# Patient Record
Sex: Female | Born: 1955 | Race: White | Hispanic: No | Marital: Married | Smoking: Never smoker
Health system: Southern US, Community
[De-identification: ages and names within clinical notes are randomized; demographics above are authoritative.]

## PROBLEM LIST (undated history)

## (undated) DIAGNOSIS — Z8489 Family history of other specified conditions: Secondary | ICD-10-CM

## (undated) DIAGNOSIS — K589 Irritable bowel syndrome without diarrhea: Secondary | ICD-10-CM

## (undated) DIAGNOSIS — G8929 Other chronic pain: Secondary | ICD-10-CM

## (undated) DIAGNOSIS — Z9889 Other specified postprocedural states: Secondary | ICD-10-CM

## (undated) DIAGNOSIS — E559 Vitamin D deficiency, unspecified: Secondary | ICD-10-CM

## (undated) DIAGNOSIS — M179 Osteoarthritis of knee, unspecified: Secondary | ICD-10-CM

## (undated) DIAGNOSIS — M7632 Iliotibial band syndrome, left leg: Secondary | ICD-10-CM

## (undated) DIAGNOSIS — E785 Hyperlipidemia, unspecified: Secondary | ICD-10-CM

## (undated) DIAGNOSIS — Z79899 Other long term (current) drug therapy: Secondary | ICD-10-CM

## (undated) DIAGNOSIS — M549 Dorsalgia, unspecified: Secondary | ICD-10-CM

## (undated) DIAGNOSIS — M1710 Unilateral primary osteoarthritis, unspecified knee: Secondary | ICD-10-CM

## (undated) DIAGNOSIS — K219 Gastro-esophageal reflux disease without esophagitis: Secondary | ICD-10-CM

## (undated) DIAGNOSIS — R609 Edema, unspecified: Secondary | ICD-10-CM

## (undated) DIAGNOSIS — J189 Pneumonia, unspecified organism: Secondary | ICD-10-CM

## (undated) DIAGNOSIS — R112 Nausea with vomiting, unspecified: Secondary | ICD-10-CM

## (undated) DIAGNOSIS — I10 Essential (primary) hypertension: Secondary | ICD-10-CM

## (undated) DIAGNOSIS — I499 Cardiac arrhythmia, unspecified: Secondary | ICD-10-CM

## (undated) HISTORY — DX: Vitamin D deficiency, unspecified: E55.9

## (undated) HISTORY — DX: Iliotibial band syndrome, left leg: M76.32

## (undated) HISTORY — DX: Osteoarthritis of knee, unspecified: M17.9

## (undated) HISTORY — DX: Hyperlipidemia, unspecified: E78.5

## (undated) HISTORY — PX: COLONOSCOPY: SHX174

## (undated) HISTORY — DX: Edema, unspecified: R60.9

## (undated) HISTORY — PX: OTHER SURGICAL HISTORY: SHX169

## (undated) HISTORY — PX: UPPER GI ENDOSCOPY: SHX6162

## (undated) HISTORY — DX: Other long term (current) drug therapy: Z79.899

## (undated) HISTORY — DX: Unilateral primary osteoarthritis, unspecified knee: M17.10

---
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
## 1988-11-21 HISTORY — PX: TUBAL LIGATION: SHX77

## 1998-05-15 ENCOUNTER — Other Ambulatory Visit: Admission: RE | Admit: 1998-05-15 | Discharge: 1998-05-15 | Payer: Self-pay | Admitting: Obstetrics & Gynecology

## 1999-04-28 ENCOUNTER — Other Ambulatory Visit: Admission: RE | Admit: 1999-04-28 | Discharge: 1999-04-28 | Payer: Self-pay | Admitting: Obstetrics & Gynecology

## 2000-05-19 ENCOUNTER — Other Ambulatory Visit: Admission: RE | Admit: 2000-05-19 | Discharge: 2000-05-19 | Payer: Self-pay | Admitting: Obstetrics & Gynecology

## 2000-12-27 ENCOUNTER — Encounter (INDEPENDENT_AMBULATORY_CARE_PROVIDER_SITE_OTHER): Payer: Self-pay

## 2000-12-27 ENCOUNTER — Other Ambulatory Visit: Admission: RE | Admit: 2000-12-27 | Discharge: 2000-12-27 | Payer: Self-pay | Admitting: Obstetrics & Gynecology

## 2002-07-08 ENCOUNTER — Other Ambulatory Visit: Admission: RE | Admit: 2002-07-08 | Discharge: 2002-07-08 | Payer: Self-pay | Admitting: Obstetrics & Gynecology

## 2003-10-15 ENCOUNTER — Other Ambulatory Visit: Admission: RE | Admit: 2003-10-15 | Discharge: 2003-10-15 | Payer: Self-pay | Admitting: Obstetrics & Gynecology

## 2004-08-04 ENCOUNTER — Encounter: Admission: RE | Admit: 2004-08-04 | Discharge: 2004-08-04 | Payer: Self-pay | Admitting: Orthopedic Surgery

## 2004-11-16 ENCOUNTER — Other Ambulatory Visit: Admission: RE | Admit: 2004-11-16 | Discharge: 2004-11-16 | Payer: Self-pay | Admitting: Obstetrics & Gynecology

## 2004-11-21 HISTORY — PX: KNEE ARTHROSCOPY: SUR90

## 2006-06-28 ENCOUNTER — Ambulatory Visit (HOSPITAL_BASED_OUTPATIENT_CLINIC_OR_DEPARTMENT_OTHER): Admission: RE | Admit: 2006-06-28 | Discharge: 2006-06-28 | Payer: Self-pay | Admitting: Orthopedic Surgery

## 2008-05-26 ENCOUNTER — Inpatient Hospital Stay (HOSPITAL_COMMUNITY): Admission: RE | Admit: 2008-05-26 | Discharge: 2008-05-28 | Payer: Self-pay | Admitting: Orthopedic Surgery

## 2011-04-05 NOTE — Op Note (Signed)
Alexis Choi, Alexis Choi            ACCOUNT NO.:  [id number]   MEDICAL RECORD NO.:  [id number]          PATIENT TYPE:  INP   LOCATION:  5015                         FACILITY:  [facility]   PHYSICIAN:  Mila Homer. Sherlean Foot, M.D. DATE OF BIRTH:  03/31/1956   DATE OF PROCEDURE:  05/26/2008  DATE OF DISCHARGE:                               OPERATIVE REPORT   SURGEON:  Mila Homer. Sherlean Foot, MD   ASSISTANT:  Laural Benes. Su Hilt, [place]   ANESTHESIA:  General.   PREOPERATIVE DIAGNOSIS:  Right knee osteoarthritis.   POSTOPERATIVE DIAGNOSIS:  Right knee osteoarthritis.   PROCEDURE:  Right total knee arthroplasty.   INDICATIONS FOR PROCEDURE:  The patient is a 51-year white female with  failure of conservative measures for osteoarthritis of right knee.  Informed consent obtained.   DESCRIPTION OF PROCEDURE:  The patient was laid supine and administered  general anesthesia.  The right leg was prepped and draped in usual  sterile fashion.  The leg was exsanguinated with a Esmarch tourniquet  and inflated to 400 mmHg due to the size of the leg.  Midline incision  was made with a #10 blade and estimated this approximately 10 inches in  length due to the size of the leg.  I then used a new blade to make a  median parapatellar arthrotomy for synovectomy.  I had to use a Chandler  retractor due to the size of the leg to retract the quad tendon.  I then  elevated deep MCL off the medial crest of the tibia.  I everted the  patella and it measured 21 mm, directly reamed down 9 mm drilled through  lug holes with 32 patella trial in place recreated at 12-mm thickness.  I then went into flexion and with some difficulty I then placed the  Homan retractor and protected her patella and patellar tendon.  I made a  perpendicular cut to the anatomic axis of the tibia.  I then used  intramedullary guide system on the femur making 6 degrees valgus cut.  Sized to a size D on the femur and 2-3 degree external rotation holes  and  made the anterior, posterior, and chamfer cuts with sagittal saw.  I  then placed the lamina spreader in the knee and removed the ACL, PCL,  medial and lateral menisci, and posterior condylar osteophytes.  A 10-mm  spacer block worked well and provided excellent flexion/extension gap  balance.  I then finished the femur with a size D finishing block,  finished the tibia with size 4 tibial tray drilling kit.  Then trialed  with a 4 tibia, D femur, 10 insert, 32 patella and had good  flexion/extension gap balance with little bit of the lateral tracking  patella and I performed the lateral release.  I then copiously  irrigated.  Cemented in the components, removed all excess cement,  allowed the cement to harden in extension.  I then left a Hemovac coming  out superolaterally deep to the arthrotomy and pain catheter coming out  superomedially and superficial of the arthrotomy and obtained hemostasis  after the tourniquet was down.  I  irrigated once again and then closed  the deep soft tissues with buried 0 Vicryl, subcuticular with 3-0 Vicryl  stitch, and skin staples.  Dressed with Xeroform dressing, sponges,  sterile Webril, and two 6-inch Ace wraps since the TED  stockings would not fit due to the size of her leg.  Estimated blood  loss 300 mL.  Tourniquet time 54 minutes.  Please note, a modifier 22  was necessary for this case due to the morbid obesity and nearly every  step being more difficult.           ______________________________  Mila Homer. Sherlean Foot, M.D.     SDL/MEDQ  D:  05/26/2008  T:  05/26/2008  Job:  [id number]

## 2011-04-08 NOTE — Op Note (Signed)
NAMEJOURDAN, Alexis Choi            ACCOUNT NO.:  [id number]   MEDICAL RECORD NO.:  [id number]          PATIENT TYPE:  AMB   LOCATION:  DSC                          FACILITY:  [facility]   PHYSICIAN:  Mila Homer. Sherlean Foot, M.D. DATE OF BIRTH:  Dec 13, 1955   DATE OF PROCEDURE:  06/28/2006  DATE OF DISCHARGE:                                 OPERATIVE REPORT   SURGEON:  Mila Homer. Sherlean Foot, M.D.   ASSISTANT:  None.   PREOPERATIVE DIAGNOSES:  Right knee medial meniscal tear, lateral meniscal  tear, osteoarthritis.   POSTOPERATIVE DIAGNOSES:  Right knee medial meniscal tear, lateral meniscal  tear, osteoarthritis.   PROCEDURE:  Right knee arthroscopy with partial lateral meniscectomy,  partial medial meniscectomy, chondroplasty of all three compartments.   ANESTHESIA:  General.   INDICATIONS FOR PROCEDURE:  The patient is a 55 year old white female with  symptoms and radiograph evidence of osteoarthritis.  Informed consent was  obtained.   DESCRIPTION OF PROCEDURE:  The patient was laid supine and administered MAC  anesthesia.  The right leg was prepped and draped in the usual sterile  fashion.  Inferolateral and inferomedial portals were created with a #11  blade, blunt trocar and cannula.  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  I performed a chondroplasty of the patella and the  trochlea.  I then went back in the medial compartment and performed a  posterior horn partial medial meniscectomy with the straight basket,  upbiting and basket forceps and the [employer].  There was a very  large osteophytic sharp spur in the notch.  I did debride this with a 4.0-mm  cylindrical bur.  I then went into the figure-four position and performed a  posterior horn partial lateral meniscectomy with the straight basket forceps  and  the [employer] shaver.  I then performed a chondroplasty over much of  the weightbearing portion of the lateral femoral condyle as well.  I then  lavaged and closed with 4-0 nylon sutures, dressed with Adaptic, 4 x 4,  sterile Webril and an Ace wrap.  Complications:  None.  Drains:  None.           ______________________________  Mila Homer. Sherlean Foot, M.D.     SDL/MEDQ  D:  06/28/2006  T:  06/28/2006  Job:  [id number]

## 2011-08-18 LAB — URINALYSIS, ROUTINE W REFLEX MICROSCOPIC
Bilirubin Urine: NEGATIVE
Hgb urine dipstick: NEGATIVE
Hgb urine dipstick: NEGATIVE
Nitrite: NEGATIVE
Protein, ur: NEGATIVE
Specific Gravity, Urine: 1.021
Urobilinogen, UA: 0.2

## 2011-08-18 LAB — URINE CULTURE

## 2011-08-18 LAB — COMPREHENSIVE METABOLIC PANEL
AST: 22
Alkaline Phosphatase: 60
BUN: 13
GFR calc Af Amer: >60
Potassium: 4.4
Total Bilirubin: 0.4
Total Protein: 6.8

## 2011-08-18 LAB — DIFFERENTIAL
Basophils Absolute: 0.1
Basophils Relative: 1
Basophils Relative: 1
Eosinophils Relative: 2
Eosinophils Relative: 4
Lymphocytes Relative: 21
Lymphocytes Relative: 31
Lymphs Abs: 2.4
Lymphs Abs: 3.4
Monocytes Absolute: 0.5
Monocytes Absolute: 0.6
Monocytes Relative: 5
Monocytes Relative: 6
Neutro Abs: 7.9 — ABNORMAL HIGH
Neutrophils Relative %: 58
Neutrophils Relative %: 71

## 2011-08-18 LAB — CBC
HCT: 33.2 — ABNORMAL LOW
HCT: 39.7
Hemoglobin: 10 — ABNORMAL LOW
Hemoglobin: 13.1
Hemoglobin: 13.4
MCHC: 33.6
MCHC: 33.7
MCHC: 34.6
MCV: 86.7
MCV: 87
Platelets: 282
RBC: 3.32 — ABNORMAL LOW
RDW: 13.9
RDW: 14.1
RDW: 14.2
WBC: 10.8 — ABNORMAL HIGH

## 2011-08-18 LAB — BASIC METABOLIC PANEL
BUN: 6
Calcium: 8.6
Chloride: 100
Chloride: 102
Creatinine, Ser: 0.49
Creatinine, Ser: 0.62
GFR calc Af Amer: >60
Glucose, Bld: 149 — ABNORMAL HIGH
Glucose, Bld: 204 — ABNORMAL HIGH
Potassium: 3.8
Sodium: 135
Sodium: 136

## 2011-08-18 LAB — CROSSMATCH: Antibody Screen: NEGATIVE

## 2011-08-18 LAB — PROTIME-INR: INR: 0.9

## 2011-08-18 LAB — ABO/RH: ABO/RH(D): A POS

## 2011-08-18 LAB — HEMOGLOBIN A1C: Hgb A1c MFr Bld: 6.9 — ABNORMAL HIGH

## 2011-10-26 ENCOUNTER — Encounter: Payer: Self-pay | Admitting: Internal Medicine

## 2011-10-26 HISTORY — PX: UPPER GI ENDOSCOPY: SHX6162

## 2011-11-03 ENCOUNTER — Encounter: Payer: Self-pay | Admitting: Internal Medicine

## 2012-05-14 ENCOUNTER — Encounter: Payer: Self-pay | Admitting: Internal Medicine

## 2012-05-14 ENCOUNTER — Ambulatory Visit (INDEPENDENT_AMBULATORY_CARE_PROVIDER_SITE_OTHER): Payer: BC Managed Care – PPO | Admitting: Internal Medicine

## 2012-05-14 VITALS — BP 140/85 | HR 82 | Ht 62.0 in | Wt 272.1 lb

## 2012-05-14 DIAGNOSIS — R002 Palpitations: Secondary | ICD-10-CM

## 2012-05-14 NOTE — Patient Instructions (Signed)
Your physician recommends that you schedule a follow-up appointment in: AS NEEDED  

## 2012-05-14 NOTE — Progress Notes (Addendum)
ELECTROPHYSIOLOGY CONSULT NOTE  Patient ID: Alexis Choi MRN: [id number], DOB/AGE: [birth year]   Date of Consult: 05/14/2012  Primary Physician: Lucila Maine, MD Reason for Consultation: PSVT  History of Present Illness Alexis Choi is a pleasant 56 year old woman with DM and dyslipidemia who has been referred by her PCP for EP evaluation of palpitations and abnormal event monitor. Alexis Choi reports intermittent tachypalpitations since November 2012. Initially, these were occurring on a daily basis, sometimes 2-3 times per day. She states stress and caffeine make them worse. She states "my heart stops then starts jumping and fluttering" accompanied by SOB and dizziness/near-syncope. She denies chest pain. She denies frank syncope. She wore an event monitor which revealed a narrow complex tachycardia and she was started on Verapamil by her PCP. This has helped, along with decreasing her caffeine intake. Of note, she denies history of CAD/MI, valvular heart disease or CHF. She denies thyroid dysfunction.  Past Medical History  Diagnosis Date  . Diabetes mellitus   . Hyperlipidemia   . Unspecified vitamin D deficiency   . Osteoarthritis of knee     Past Surgical History  Procedure Date  . Right knee arthroscopy   . Knee replacment     Allergies/Intolerances Allergen Reactions  . Eggs   . Metformin     Current Home Medications Medication Sig Dispense Refill  . acetaminophen (TYLENOL ARTHRITIS PAIN) 650 MG CR tablet Take 650 mg by mouth every 8 (eight) hours as needed.      Marland Kitchen aspirin 81 MG tablet Take 81 mg by mouth daily.      . B Complex Vitamins (VITAMIN-B COMPLEX PO) Take 500 mg by mouth.      . celecoxib (CELEBREX) 200 MG capsule Take 200 mg by mouth 2 (two) times daily.      Marland Kitchen dicyclomine (BENTYL) 10 MG capsule Take 10 mg by mouth 2 (two) times daily.      Marland Kitchen glucose blood test strip 1 each by Other route as needed. Use as instructed      . halobetasol (ULTRAVATE) 0.05  % cream Apply topically 4 (four) times daily.      . insulin detemir (LEVEMIR) 100 UNIT/ML injection Inject 100 Units into the skin at bedtime.      . insulin lispro (HUMALOG) 100 UNIT/ML injection Inject 100 Units into the skin 3 (three) times daily before meals.      . Insulin Pen Needle (PEN NEEDLES 31GX5/16") 31G X 8 MM MISC by Does not apply route.      . Insulin Syringe-Needle U-100 (ACCUSURE INS SYR 1CC/31GX5/16") 31G X 5/16" 1 ML MISC by Does not apply route.      . magnesium gluconate (MAGONATE) 500 MG tablet Take 500 mg by mouth daily.      . mometasone (NASONEX) 50 MCG/ACT nasal spray Place 2 sprays into the nose daily.      . Multiple Vitamin (MULTIVITAMIN) capsule Take 1 capsule by mouth daily.      Marland Kitchen omeprazole (PRILOSEC OTC) 20 MG tablet Take 20 mg by mouth daily.      Marland Kitchen oxymetazoline (AFRIN) 0.05 % nasal spray Place 2 sprays into the nose 2 (two) times daily.      . pioglitazone (ACTOS) 45 MG tablet Take 45 mg by mouth daily.      . pramlintide (SYMLIN) 600 MCG/ML injection Inject 120 mcg into the skin 3 (three) times daily before meals.      . valsartan-hydrochlorothiazide (DIOVAN-HCT) 80-12.5 MG per  tablet Take 1 tablet by mouth daily.      . verapamil (CALAN-SR) 120 MG CR tablet Take 120 mg by mouth 2 (two) times daily.       Social History Social History  . Marital Status: Married   Social History Main Topics  . Smoking status: Never Smoker   . Smokeless tobacco: Not on file  . Alcohol Use: No  . Drug Use: No   Review of Systems General:  No chills, fever, night sweats or weight changes Cardiovascular:  No chest pain, dyspnea on exertion, edema, orthopnea, paroxysmal nocturnal dyspnea Dermatological: No rash, lesions or masses Respiratory: No cough, dyspnea Urologic: No hematuria, dysuria Abdominal:   No nausea, vomiting, diarrhea, bright red blood per rectum, melena, or hematemesis Neurologic:  No visual changes, weakness, changes in mental status All other  systems reviewed and are otherwise negative except as noted above.  Physical Exam Blood pressure 140/85, pulse 82, height 5\' 2"  (1.575 m), weight 272 lb 1.9 oz (123.433 kg).  General: Well developed, well appearing 56 year old female in no acute distress. HEENT: Normocephalic, atraumatic. EOMs intact. Sclera nonicteric. Oropharynx clear.  Neck: Supple. No JVD. Lungs:  Respirations regular and unlabored, CTA bilaterally. No wheezes, rales or rhonchi. Heart: RRR. S1, S2 present. No murmurs, rub, S3 or S4. Abdomen: Soft, non-distended.  Extremities: No clubbing, cyanosis or edema. DP/PT/Radials 2+ and equal bilaterally. Psych: Normal affect. Neuro: Alert and oriented X 3. Moves all extremities spontaneously.   Labs 04/19/2012  sodium 143, potassium 4.3, BUN 22, creatinine 0.5, AST 29, ALT 29, alk phos 72, albumin 4.2, magnesium 1.6  Event monitor strips reviewed which show occasional PACs and brief, nonsustained atrial tachycardia  Assessment and Plan 1. Paroxysmal atrial tachycardia -  Alexis Choi is symptomatic with palpitations, SOB and dizziness. Her symptoms have improved with Verapamil and decreased caffeine intake. We provided reassurance regarding the benign nature of PACs and PAT. We explained that treatment is based on symptoms and patient preference. We discussed treatment options with her today and she would like to continue medical therapy and we agree as Verapamil has helped her symptoms significantly. We discussed avoidance of triggers/stimulants, like caffeine, which she is doing. We will see her back in follow-up as needed.   Thank you for allowing [place] to participate in the care of your patient. Please call with any questions. The patient was seen, examined and plan of care formulated with Dr. Graciela Husbands. Signed, Rick Duff, PA-C 05/14/2012, 4:08 PM  The patient has brief episodes of an erratic heartbeat. Underwent recorder tracings, this is consistent and correlates with  nonsustained atrial tachycardia lasting up to 5-8 beats. These are improved on the verapamil. I have suggested alternative agents can be pursued i.e. beta blockers if her symptoms worsen. There is no particular need to augment therapy at this time.  She also has impending knee surgery. As her functional status is really quite limited I would recommend Myoview scanning prior to her proceed with that surgery. We will be glad to provide that service here or could be done [place] whichever is more convenient for the patient. Sherryl Manges, MD 05/14/2012 5:49 PM

## 2012-05-15 ENCOUNTER — Encounter: Payer: Self-pay | Admitting: Internal Medicine

## 2012-05-18 ENCOUNTER — Ambulatory Visit: Payer: Self-pay | Admitting: Internal Medicine

## 2012-06-01 ENCOUNTER — Other Ambulatory Visit: Payer: Self-pay

## 2013-04-09 ENCOUNTER — Telehealth: Payer: Self-pay | Admitting: Internal Medicine

## 2013-04-09 DIAGNOSIS — Z01818 Encounter for other preprocedural examination: Secondary | ICD-10-CM

## 2013-04-09 NOTE — Telephone Encounter (Signed)
Spoke with pt, according to dr Odessa Fleming last office note, pt would need stress testing prior to any knee surgery. lexiscan scheduled, instructions mailed to the pt home address.

## 2013-04-09 NOTE — Telephone Encounter (Signed)
New Problem:    Patient called in wanting to have a stress test scheduled to be surgically cleared for an upcoming knee replacement procedure.  Please call back.

## 2013-04-18 ENCOUNTER — Telehealth: Payer: Self-pay | Admitting: Internal Medicine

## 2013-04-18 ENCOUNTER — Ambulatory Visit (HOSPITAL_COMMUNITY): Payer: BC Managed Care – PPO | Attending: Internal Medicine | Admitting: Radiology

## 2013-04-18 VITALS — BP 136/67 | HR 81 | Ht 62.0 in | Wt 249.0 lb

## 2013-04-18 DIAGNOSIS — R000 Tachycardia, unspecified: Secondary | ICD-10-CM | POA: Insufficient documentation

## 2013-04-18 DIAGNOSIS — Z01810 Encounter for preprocedural cardiovascular examination: Secondary | ICD-10-CM | POA: Insufficient documentation

## 2013-04-18 DIAGNOSIS — R55 Syncope and collapse: Secondary | ICD-10-CM | POA: Insufficient documentation

## 2013-04-18 DIAGNOSIS — Z01818 Encounter for other preprocedural examination: Secondary | ICD-10-CM

## 2013-04-18 DIAGNOSIS — R0989 Other specified symptoms and signs involving the circulatory and respiratory systems: Secondary | ICD-10-CM | POA: Insufficient documentation

## 2013-04-18 DIAGNOSIS — E119 Type 2 diabetes mellitus without complications: Secondary | ICD-10-CM | POA: Insufficient documentation

## 2013-04-18 DIAGNOSIS — R0602 Shortness of breath: Secondary | ICD-10-CM | POA: Insufficient documentation

## 2013-04-18 DIAGNOSIS — E669 Obesity, unspecified: Secondary | ICD-10-CM | POA: Insufficient documentation

## 2013-04-18 DIAGNOSIS — E785 Hyperlipidemia, unspecified: Secondary | ICD-10-CM | POA: Insufficient documentation

## 2013-04-18 DIAGNOSIS — Z794 Long term (current) use of insulin: Secondary | ICD-10-CM | POA: Insufficient documentation

## 2013-04-18 DIAGNOSIS — Z87891 Personal history of nicotine dependence: Secondary | ICD-10-CM | POA: Insufficient documentation

## 2013-04-18 DIAGNOSIS — R42 Dizziness and giddiness: Secondary | ICD-10-CM | POA: Insufficient documentation

## 2013-04-18 DIAGNOSIS — R0609 Other forms of dyspnea: Secondary | ICD-10-CM | POA: Insufficient documentation

## 2013-04-18 DIAGNOSIS — R002 Palpitations: Secondary | ICD-10-CM | POA: Insufficient documentation

## 2013-04-18 DIAGNOSIS — I10 Essential (primary) hypertension: Secondary | ICD-10-CM | POA: Insufficient documentation

## 2013-04-18 MED ORDER — TECHNETIUM TC 99M SESTAMIBI GENERIC - CARDIOLITE
30.0000 | Freq: Once | INTRAVENOUS | Status: AC | PRN
  Administered 2013-04-18: 30 via INTRAVENOUS

## 2013-04-18 MED ORDER — TECHNETIUM TC 99M SESTAMIBI GENERIC - CARDIOLITE
10.0000 | Freq: Once | INTRAVENOUS | Status: AC | PRN
  Administered 2013-04-18: 10 via INTRAVENOUS

## 2013-04-18 MED ORDER — REGADENOSON 0.4 MG/5ML IV SOLN
0.4000 mg | Freq: Once | INTRAVENOUS | Status: AC
  Administered 2013-04-18: 0.4 mg via INTRAVENOUS

## 2013-04-18 NOTE — Progress Notes (Signed)
[facility] SITE [address] NUCLEAR MED [address] [place], [place] [address] [phone]    Cardiology Nuclear Med Study  Alexis Choi is a 57 y.o. female     MRN : [id number]     DOB: October 25, 1956  Procedure Date: 04/18/2013  Nuclear Med Background Indication for Stress Test:  Evaluation for Ischemia, Abnormal Event Monitory and Pending Surgical Clearance for Left Knee Surgery with Dr. Darcella Cheshire History:  '09 MPS:ok per patient Cardiac Risk Factors: History of Smoking, Hypertension, IDDM Type 2, Lipids and Obesity  Symptoms:  Dizziness, DOE, Near Syncope, Palpitations, Rapid HR and SOB   Nuclear Pre-Procedure Caffeine/Decaff Intake:  None NPO After: 10:00pm   Lungs:  Clear. O2 Sat: 99% on room air. IV 0.9% NS with Angio Cath:  22g  IV Site: R Hand  IV Started by:  Stanton Kidney, EMT-P  Chest Size (in):  40 Cup Size: B  Height: 5\' 2"  (1.575 m)  Weight:  249 lb (112.946 kg)  BMI:  Body mass index is 45.53 kg/(m^2). Tech Comments:  CBG= 226 @ 8am, per patient and 150 @ 10:30 per patient    Nuclear Med Study 1 or 2 day study: 1 day  Stress Test Type:  Lexiscan  Reading MD: Willa Rough, MD  Order Authorizing Provider:  Berton Mount, MD  Resting Radionuclide: Technetium 103m Sestamibi  Resting Radionuclide Dose: 11.0 mCi   Stress Radionuclide:  Technetium 9m Sestamibi  Stress Radionuclide Dose: 33.0 mCi           Stress Protocol Rest HR: 81 Stress HR: 112  Rest BP: 136/67 Stress BP: 143/67  Exercise Time (min): n/a METS: n/a   Predicted Max HR: 164 bpm % Max HR: 68.29 bpm Rate Pressure Product: 27253   Dose of Adenosine (mg):  n/a Dose of Lexiscan: 0.4 mg  Dose of Atropine (mg): n/a Dose of Dobutamine: n/a mcg/kg/min (at max HR)  Stress Test Technologist: Smiley Houseman, CMA-N  Nuclear Technologist:  Domenic Polite, CNMT     Rest Procedure:  Myocardial perfusion imaging was performed at rest 45 minutes following the intravenous administration of Technetium  64m Sestamibi.  Rest ECG: NSR - Normal EKG  Stress Procedure:  The patient received IV Lexiscan 0.4 mg over 15-seconds.  Technetium 47m Sestamibi injected at 30-seconds.  She c/o jaw pain with Lexiscan.  Quantitative spect images were obtained after a 45 minute delay.  Stress ECG: No significant change from baseline ECG  QPS Raw Data Images:  Patient motion noted; appropriate software correction applied. Stress Images:  Normal homogeneous uptake in all areas of the myocardium. Rest Images:  Normal homogeneous uptake in all areas of the myocardium. Subtraction (SDS):  No evidence of ischemia. Transient Ischemic Dilatation (Normal <1.22):  0.89 Lung/Heart Ratio (Normal <0.45):  0.28  Quantitative Gated Spect Images QGS EDV:  76 ml QGS ESV:  26 ml  Impression Exercise Capacity:  Lexiscan with no exercise. BP Response:  Normal blood pressure response. Clinical Symptoms:  teeth and jaw hurt ECG Impression:  No significant ST segment change suggestive of ischemia. Comparison with Prior Nuclear Study: No images to compare  Overall Impression:  Normal stress nuclear study. This is a low risk scan.  LV Ejection Fraction: 65%.  LV Wall Motion:  Normal Wall Motion.  Willa Rough, MD

## 2013-04-18 NOTE — Telephone Encounter (Signed)
Correction This Cardiac Clearance Was Given to Alexis Choi not [name] 04/18/13/KM

## 2013-04-18 NOTE — Telephone Encounter (Signed)
Walk in pt Form " [facility]" Cardiac Clearance  Paper dropped off gave to [facility] 04/18/13/KM

## 2013-05-01 ENCOUNTER — Telehealth: Payer: Self-pay | Admitting: Internal Medicine

## 2013-05-01 NOTE — Telephone Encounter (Signed)
New problem   Calling for stress test results

## 2013-05-01 NOTE — Telephone Encounter (Addendum)
Patient awaiting results from 5/29 stress test so that she can get surgical clearance for upcoming surgery with Dr. Sherlean Foot.  Also, patient is wanting to change off Verapamil to a different medication so that she can start taking Actos again. She states that when she takes Verapamil with Actos she has "major fluid retention and weight gain issues". Routed to MD/RN Larita Fife Via.  LM for patient that Dr. Graciela Husbands still seeing patients in clinic at this time. Her request was forwarded to her for his review.

## 2013-05-02 NOTE — Telephone Encounter (Signed)
Note sent to dr Sherlean Foot  Stress test looked great

## 2013-05-21 ENCOUNTER — Encounter (HOSPITAL_COMMUNITY): Payer: Self-pay | Admitting: Pharmacy Technician

## 2013-05-22 ENCOUNTER — Other Ambulatory Visit: Payer: Self-pay | Admitting: Orthopedic Surgery

## 2013-05-27 ENCOUNTER — Encounter (HOSPITAL_COMMUNITY): Payer: Self-pay

## 2013-05-27 ENCOUNTER — Encounter (HOSPITAL_COMMUNITY)
Admission: RE | Admit: 2013-05-27 | Discharge: 2013-05-27 | Disposition: A | Discharge status: Home / Self Care | Payer: BC Managed Care – PPO | Source: Ambulatory Visit | Attending: Orthopedic Surgery | Admitting: Orthopedic Surgery

## 2013-05-27 ENCOUNTER — Ambulatory Visit (HOSPITAL_COMMUNITY)
Admission: RE | Admit: 2013-05-27 | Discharge: 2013-05-27 | Disposition: A | Discharge status: Home / Self Care | Payer: BC Managed Care – PPO | Source: Ambulatory Visit | Attending: Orthopedic Surgery | Admitting: Orthopedic Surgery

## 2013-05-27 DIAGNOSIS — E119 Type 2 diabetes mellitus without complications: Secondary | ICD-10-CM | POA: Insufficient documentation

## 2013-05-27 DIAGNOSIS — Z0183 Encounter for blood typing: Secondary | ICD-10-CM | POA: Insufficient documentation

## 2013-05-27 DIAGNOSIS — Z01818 Encounter for other preprocedural examination: Secondary | ICD-10-CM | POA: Insufficient documentation

## 2013-05-27 DIAGNOSIS — E785 Hyperlipidemia, unspecified: Secondary | ICD-10-CM | POA: Insufficient documentation

## 2013-05-27 DIAGNOSIS — Z01812 Encounter for preprocedural laboratory examination: Secondary | ICD-10-CM | POA: Insufficient documentation

## 2013-05-27 HISTORY — DX: Cardiac arrhythmia, unspecified: I49.9

## 2013-05-27 HISTORY — DX: Nausea with vomiting, unspecified: R11.2

## 2013-05-27 HISTORY — DX: Gastro-esophageal reflux disease without esophagitis: K21.9

## 2013-05-27 HISTORY — DX: Nausea with vomiting, unspecified: Z98.890

## 2013-05-27 LAB — CBC WITH DIFFERENTIAL/PLATELET
Basophils Absolute: 0.1 10*3/uL (ref 0.0–0.1)
Basophils Relative: 0 % (ref 0–1)
Eosinophils Absolute: 0.2 10*3/uL (ref 0.0–0.7)
Eosinophils Relative: 2 % (ref 0–5)
HCT: 38.8 % (ref 36.0–46.0)
Hemoglobin: 13.1 g/dL (ref 12.0–15.0)
MCH: 28.6 pg (ref 26.0–34.0)
MCHC: 33.8 g/dL (ref 30.0–36.0)
MCV: 84.7 fL (ref 78.0–100.0)
Monocytes Absolute: 0.6 10*3/uL (ref 0.1–1.0)
Monocytes Relative: 5 % (ref 3–12)
RDW: 15.3 % (ref 11.5–15.5)

## 2013-05-27 LAB — COMPREHENSIVE METABOLIC PANEL
AST: 24 U/L (ref 0–37)
Albumin: 3.9 g/dL (ref 3.5–5.2)
BUN: 19 mg/dL (ref 6–23)
Calcium: 9.2 mg/dL (ref 8.4–10.5)
Chloride: 102 mEq/L (ref 96–112)
Creatinine, Ser: 0.59 mg/dL (ref 0.50–1.10)
Total Bilirubin: 0.3 mg/dL (ref 0.3–1.2)
Total Protein: 7.2 g/dL (ref 6.0–8.3)

## 2013-05-27 LAB — URINALYSIS, ROUTINE W REFLEX MICROSCOPIC
Bilirubin Urine: NEGATIVE
Ketones, ur: NEGATIVE mg/dL
Leukocytes, UA: NEGATIVE
Nitrite: NEGATIVE
Specific Gravity, Urine: 1.028 (ref 1.005–1.030)
Urobilinogen, UA: 0.2 mg/dL (ref 0.0–1.0)

## 2013-05-27 LAB — TYPE AND SCREEN
ABO/RH(D): A POS
Antibody Screen: NEGATIVE

## 2013-05-27 LAB — PROTIME-INR
INR: 0.98 (ref 0.00–1.49)
Prothrombin Time: 12.8 seconds (ref 11.6–15.2)

## 2013-05-27 LAB — APTT: aPTT: 29 seconds (ref 24–37)

## 2013-05-27 LAB — URINE MICROSCOPIC-ADD ON

## 2013-05-27 NOTE — Pre-Procedure Instructions (Addendum)
Jakita Dutkiewicz Mihalko  05/27/2013   Your procedure is scheduled on:  7.14.14  Report to [facility] at 700 AM.  Call this number if you have problems the morning of surgery: [phone]   Remember:   Do not eat food or drink liquids after midnight.   Take these medicines the morning of surgery with A SIP OF WATER: omeprazole   Do not wear jewelry, make-up or nail polish.  Do not wear lotions, powders, or perfumes. You may wear deodorant.  Do not shave 48 hours prior to surgery. Men may shave face and neck.  Do not bring valuables to the hospital.  [facility] is not responsible                   for any belongings or valuables.  Contacts, dentures or bridgework may not be worn into surgery.  Leave suitcase in the car. After surgery it may be brought to your room.  For patients admitted to the hospital, checkout time is 11:00 AM the day of  discharge.   Patients discharged the day of surgery will not be allowed to drive  home.  Name and phone number of your driver:   Special Instructions: Incentive Spirometry - Practice and bring it with you on the day of surgery. Shower using CHG 2 nights before surgery and the night before surgery.  If you shower the day of surgery use CHG.  Use special wash - you have one bottle of CHG for all showers.  You should use approximately 1/3 of the bottle for each shower.   Please read over the following fact sheets that you were given: Pain Booklet, Coughing and Deep Breathing, Blood Transfusion Information, Total Joint Packet and MRSA Information

## 2013-05-27 NOTE — Progress Notes (Signed)
ekg reqd from dr Graciela Husbands, clearance note in epic 6/14, stress 5/14

## 2013-05-27 NOTE — Progress Notes (Signed)
Office called .message left for terri, re: no teds large enough .calf size 19.5

## 2013-05-28 LAB — URINE CULTURE

## 2013-05-28 NOTE — Progress Notes (Signed)
Anesthesia chart review:  Patient is a 57 year old female scheduled for left TKA on 06/03/13 by Dr. Sherlean Foot.  History includes non-smoker, post-operative N/V, DM2, HLD, paroxsymal atrial tachycardia, GERD, osteoarthritis, chronic edema, tonsillectomy, right TKA '09. PCP is listed as Dr. Lucila Maine.    Dr. Lorin Picket referred patient to [facility] for evaluation of PSVT.  She was seen by Rick Duff, PA-C on 05/14/12.  Her symptoms were better controlled with verapamil and caffeine restriction.  Dr. Graciela Husbands had recommended a Myoview prior to impending knee surgery which was ultimately done on 04/18/13 and showed: Normal stress nuclear study. This is a low risk scan. LV Ejection Fraction: 65%. LV Wall Motion: Normal Wall Motion.  EKG on 04/18/13 (see Muse) showed ST @ 111 bpm, cannot rule out anterior infarct (age undetermined).  Preoperative CXR and labs noted.  She will get a fasting CBG on arrival.  If glucose is reasonable and otherwise no acute changes then I would anticipate that she could proceed as planned.  Velna Ochs [facility]/Anesthesiology Phone [phone] 05/28/2013 4:06 PM

## 2013-06-02 MED ORDER — CEFAZOLIN SODIUM-DEXTROSE 2-3 GM-% IV SOLR
2.0000 g | INTRAVENOUS | Status: AC
  Administered 2013-06-03: 2 g via INTRAVENOUS
  Filled 2013-06-02: qty 50

## 2013-06-02 MED ORDER — TRANEXAMIC ACID 100 MG/ML IV SOLN
1000.0000 mg | INTRAVENOUS | Status: AC
  Administered 2013-06-03: 1000 mg via INTRAVENOUS
  Filled 2013-06-02: qty 10

## 2013-06-03 ENCOUNTER — Inpatient Hospital Stay (HOSPITAL_COMMUNITY): Payer: BC Managed Care – PPO | Admitting: Anesthesiology

## 2013-06-03 ENCOUNTER — Encounter (HOSPITAL_COMMUNITY): Payer: Self-pay | Admitting: *Deleted

## 2013-06-03 ENCOUNTER — Encounter (HOSPITAL_COMMUNITY): Payer: Self-pay | Admitting: Vascular Surgery

## 2013-06-03 ENCOUNTER — Encounter (HOSPITAL_COMMUNITY)
Admission: RE | Disposition: A | Discharge status: Home / Self Care | Payer: Self-pay | Source: Ambulatory Visit | Attending: Orthopedic Surgery

## 2013-06-03 ENCOUNTER — Inpatient Hospital Stay (HOSPITAL_COMMUNITY)
Admission: RE | Admit: 2013-06-03 | Discharge: 2013-06-04 | DRG: 209 | Disposition: A | Discharge status: Home / Self Care | Payer: BC Managed Care – PPO | Source: Ambulatory Visit | Attending: Orthopedic Surgery | Admitting: Orthopedic Surgery

## 2013-06-03 DIAGNOSIS — Z794 Long term (current) use of insulin: Secondary | ICD-10-CM

## 2013-06-03 DIAGNOSIS — E785 Hyperlipidemia, unspecified: Secondary | ICD-10-CM | POA: Diagnosis present

## 2013-06-03 DIAGNOSIS — Z96652 Presence of left artificial knee joint: Secondary | ICD-10-CM

## 2013-06-03 DIAGNOSIS — K219 Gastro-esophageal reflux disease without esophagitis: Secondary | ICD-10-CM | POA: Diagnosis present

## 2013-06-03 DIAGNOSIS — E559 Vitamin D deficiency, unspecified: Secondary | ICD-10-CM | POA: Diagnosis present

## 2013-06-03 DIAGNOSIS — Z888 Allergy status to other drugs, medicaments and biological substances status: Secondary | ICD-10-CM

## 2013-06-03 DIAGNOSIS — E119 Type 2 diabetes mellitus without complications: Secondary | ICD-10-CM | POA: Diagnosis present

## 2013-06-03 DIAGNOSIS — M1710 Unilateral primary osteoarthritis, unspecified knee: Principal | ICD-10-CM | POA: Diagnosis present

## 2013-06-03 DIAGNOSIS — Z91040 Latex allergy status: Secondary | ICD-10-CM

## 2013-06-03 HISTORY — PX: TOTAL KNEE ARTHROPLASTY: SHX125

## 2013-06-03 LAB — GLUCOSE, CAPILLARY
Glucose-Capillary: 172 mg/dL — ABNORMAL HIGH (ref 70–99)
Glucose-Capillary: 172 mg/dL — ABNORMAL HIGH (ref 70–99)
Glucose-Capillary: 201 mg/dL — ABNORMAL HIGH (ref 70–99)
Glucose-Capillary: 259 mg/dL — ABNORMAL HIGH (ref 70–99)

## 2013-06-03 LAB — CREATININE, SERUM
GFR calc Af Amer: >90 mL/min (ref >90)
GFR calc non Af Amer: >90 mL/min (ref >90)

## 2013-06-03 LAB — CBC
HCT: 37 % (ref 36.0–46.0)
Hemoglobin: 12.3 g/dL (ref 12.0–15.0)
RBC: 4.32 MIL/uL (ref 3.87–5.11)
WBC: 20.7 10*3/uL — ABNORMAL HIGH (ref 4.0–10.5)

## 2013-06-03 SURGERY — ARTHROPLASTY, KNEE, TOTAL
Anesthesia: General | Site: Knee | Laterality: Left | Wound class: Clean

## 2013-06-03 MED ORDER — FLEET ENEMA 7-19 GM/118ML RE ENEM: 1.0000 | ENEMA | Freq: Once | RECTAL | Status: AC | PRN

## 2013-06-03 MED ORDER — MENTHOL 3 MG MT LOZG: 1.0000 | LOZENGE | OROMUCOSAL | Status: DC | PRN

## 2013-06-03 MED ORDER — ONDANSETRON HCL 4 MG/2ML IJ SOLN: 4.0000 mg | Freq: Four times a day (QID) | INTRAMUSCULAR | Status: DC | PRN

## 2013-06-03 MED ORDER — PROPOFOL 10 MG/ML IV BOLUS
INTRAVENOUS | Status: DC | PRN
  Administered 2013-06-03: 100 mg via INTRAVENOUS
  Administered 2013-06-03: 200 mg via INTRAVENOUS

## 2013-06-03 MED ORDER — ACETAMINOPHEN 325 MG PO TABS: 650.0000 mg | ORAL_TABLET | Freq: Four times a day (QID) | ORAL | Status: DC | PRN

## 2013-06-03 MED ORDER — BUPIVACAINE LIPOSOME 1.3 % IJ SUSP
20.0000 mL | Freq: Once | INTRAMUSCULAR | Status: DC
  Filled 2013-06-03: qty 20

## 2013-06-03 MED ORDER — ONDANSETRON HCL 4 MG/2ML IJ SOLN
INTRAMUSCULAR | Status: DC | PRN
  Administered 2013-06-03: 4 mg via INTRAVENOUS

## 2013-06-03 MED ORDER — IRBESARTAN 75 MG PO TABS
75.0000 mg | ORAL_TABLET | Freq: Every day | ORAL | Status: DC
  Administered 2013-06-03 – 2013-06-04 (×2): 75 mg via ORAL
  Filled 2013-06-03 (×2): qty 1

## 2013-06-03 MED ORDER — SODIUM CHLORIDE 0.9 % IR SOLN
Status: DC | PRN
  Administered 2013-06-03: 1000 mL
  Administered 2013-06-03: 3000 mL

## 2013-06-03 MED ORDER — LACTATED RINGERS IV SOLN
Freq: Once | INTRAVENOUS | Status: AC
  Administered 2013-06-03: 08:00:00 via INTRAVENOUS

## 2013-06-03 MED ORDER — INSULIN ASPART 100 UNIT/ML ~~LOC~~ SOLN
0.0000 [IU] | Freq: Three times a day (TID) | SUBCUTANEOUS | Status: DC
  Administered 2013-06-03: 7 [IU] via SUBCUTANEOUS
  Administered 2013-06-04: 4 [IU] via SUBCUTANEOUS
  Administered 2013-06-04: 15 [IU] via SUBCUTANEOUS
  Administered 2013-06-04: 4 [IU] via SUBCUTANEOUS

## 2013-06-03 MED ORDER — FENTANYL CITRATE 0.05 MG/ML IJ SOLN
INTRAMUSCULAR | Status: DC | PRN
  Administered 2013-06-03 (×2): 50 ug via INTRAVENOUS
  Administered 2013-06-03: 25 ug via INTRAVENOUS
  Administered 2013-06-03: 50 ug via INTRAVENOUS
  Administered 2013-06-03 (×2): 100 ug via INTRAVENOUS

## 2013-06-03 MED ORDER — PIOGLITAZONE HCL 45 MG PO TABS
45.0000 mg | ORAL_TABLET | ORAL | Status: DC
  Administered 2013-06-04: 45 mg via ORAL
  Filled 2013-06-03: qty 1

## 2013-06-03 MED ORDER — ALUM & MAG HYDROXIDE-SIMETH 200-200-20 MG/5ML PO SUSP: 30.0000 mL | ORAL | Status: DC | PRN

## 2013-06-03 MED ORDER — DIPHENHYDRAMINE HCL 12.5 MG/5ML PO ELIX
12.5000 mg | ORAL_SOLUTION | ORAL | Status: DC | PRN
Start: 2013-06-03 — End: 2013-06-04

## 2013-06-03 MED ORDER — INSULIN DETEMIR 100 UNIT/ML ~~LOC~~ SOLN
35.0000 [IU] | Freq: Every day | SUBCUTANEOUS | Status: DC
  Administered 2013-06-03: 35 [IU] via SUBCUTANEOUS
  Filled 2013-06-03 (×2): qty 0.35

## 2013-06-03 MED ORDER — BUPIVACAINE HCL (PF) 0.5 % IJ SOLN
INTRAMUSCULAR | Status: AC
  Filled 2013-06-03: qty 30

## 2013-06-03 MED ORDER — HYDROCODONE-ACETAMINOPHEN 7.5-325 MG/15ML PO SOLN
ORAL | Status: AC
  Filled 2013-06-03: qty 15

## 2013-06-03 MED ORDER — MIDAZOLAM HCL 2 MG/2ML IJ SOLN
INTRAMUSCULAR | Status: AC
  Administered 2013-06-03: 2 mg
  Filled 2013-06-03: qty 2

## 2013-06-03 MED ORDER — SIMVASTATIN 10 MG PO TABS
10.0000 mg | ORAL_TABLET | Freq: Every day | ORAL | Status: DC
  Administered 2013-06-03: 10 mg via ORAL
  Filled 2013-06-03 (×2): qty 1

## 2013-06-03 MED ORDER — INSULIN ASPART 100 UNIT/ML ~~LOC~~ SOLN: 10.0000 [IU] | Freq: Three times a day (TID) | SUBCUTANEOUS | Status: DC

## 2013-06-03 MED ORDER — INSULIN DETEMIR 100 UNIT/ML ~~LOC~~ SOLN: 70.0000 [IU] | Freq: Every day | SUBCUTANEOUS | Status: DC

## 2013-06-03 MED ORDER — SUCCINYLCHOLINE CHLORIDE 20 MG/ML IJ SOLN
INTRAMUSCULAR | Status: DC | PRN
  Administered 2013-06-03: 100 mg via INTRAVENOUS

## 2013-06-03 MED ORDER — METOCLOPRAMIDE HCL 10 MG PO TABS
5.0000 mg | ORAL_TABLET | Freq: Three times a day (TID) | ORAL | Status: DC | PRN
Start: 2013-06-03 — End: 2013-06-04

## 2013-06-03 MED ORDER — PROMETHAZINE HCL 25 MG/ML IJ SOLN
INTRAMUSCULAR | Status: AC
  Administered 2013-06-03: 6.25 mg via INTRAVENOUS
  Filled 2013-06-03: qty 1

## 2013-06-03 MED ORDER — FENTANYL CITRATE 0.05 MG/ML IJ SOLN: 100.0000 ug | Freq: Once | INTRAMUSCULAR | Status: AC

## 2013-06-03 MED ORDER — INSULIN DETEMIR 100 UNIT/ML ~~LOC~~ SOLN
35.0000 [IU] | Freq: Every day | SUBCUTANEOUS | Status: DC
  Administered 2013-06-03 – 2013-06-04 (×2): 35 [IU] via SUBCUTANEOUS
  Filled 2013-06-03 (×2): qty 0.35

## 2013-06-03 MED ORDER — LACTATED RINGERS IV SOLN
INTRAVENOUS | Status: DC | PRN
  Administered 2013-06-03 (×3): via INTRAVENOUS

## 2013-06-03 MED ORDER — ONDANSETRON HCL 4 MG PO TABS: 4.0000 mg | ORAL_TABLET | Freq: Four times a day (QID) | ORAL | Status: DC | PRN

## 2013-06-03 MED ORDER — SODIUM CHLORIDE 0.9 % IJ SOLN
INTRAMUSCULAR | Status: DC | PRN
  Administered 2013-06-03: 11:00:00

## 2013-06-03 MED ORDER — METHOCARBAMOL 500 MG PO TABS
500.0000 mg | ORAL_TABLET | Freq: Four times a day (QID) | ORAL | Status: DC | PRN
  Administered 2013-06-04: 500 mg via ORAL
  Filled 2013-06-03: qty 1

## 2013-06-03 MED ORDER — HYDROMORPHONE HCL PF 1 MG/ML IJ SOLN
1.0000 mg | INTRAMUSCULAR | Status: DC | PRN
  Administered 2013-06-03: 1 mg via INTRAVENOUS
  Filled 2013-06-03: qty 1

## 2013-06-03 MED ORDER — LIRAGLUTIDE 18 MG/3ML ~~LOC~~ SOPN: 1.8000 mL | PEN_INJECTOR | Freq: Every day | SUBCUTANEOUS | Status: DC

## 2013-06-03 MED ORDER — OMEPRAZOLE MAGNESIUM 20 MG PO TBEC: 20.0000 mg | DELAYED_RELEASE_TABLET | Freq: Every day | ORAL | Status: DC

## 2013-06-03 MED ORDER — OXYCODONE HCL 5 MG PO TABS
5.0000 mg | ORAL_TABLET | ORAL | Status: DC | PRN
  Administered 2013-06-03 – 2013-06-04 (×5): 10 mg via ORAL
  Filled 2013-06-03 (×5): qty 2

## 2013-06-03 MED ORDER — BUPIVACAINE HCL 0.5 % IJ SOLN
INTRAMUSCULAR | Status: DC | PRN
  Administered 2013-06-03: 30 mL

## 2013-06-03 MED ORDER — ZOLPIDEM TARTRATE 5 MG PO TABS: 5.0000 mg | ORAL_TABLET | Freq: Every evening | ORAL | Status: DC | PRN

## 2013-06-03 MED ORDER — VERAPAMIL HCL ER 120 MG PO TBCR
120.0000 mg | EXTENDED_RELEASE_TABLET | Freq: Every day | ORAL | Status: DC
  Administered 2013-06-03 – 2013-06-04 (×2): 120 mg via ORAL
  Filled 2013-06-03 (×2): qty 1

## 2013-06-03 MED ORDER — HYDROCHLOROTHIAZIDE 12.5 MG PO CAPS
12.5000 mg | ORAL_CAPSULE | Freq: Every day | ORAL | Status: DC
  Administered 2013-06-03 – 2013-06-04 (×2): 12.5 mg via ORAL
  Filled 2013-06-03 (×2): qty 1

## 2013-06-03 MED ORDER — CEFAZOLIN SODIUM-DEXTROSE 2-3 GM-% IV SOLR
2.0000 g | Freq: Four times a day (QID) | INTRAVENOUS | Status: AC
  Administered 2013-06-03 (×2): 2 g via INTRAVENOUS
  Filled 2013-06-03 (×3): qty 50

## 2013-06-03 MED ORDER — INSULIN ASPART 100 UNIT/ML ~~LOC~~ SOLN
11.0000 [IU] | Freq: Once | SUBCUTANEOUS | Status: AC
  Administered 2013-06-03: 11 [IU] via SUBCUTANEOUS

## 2013-06-03 MED ORDER — METOCLOPRAMIDE HCL 5 MG/ML IJ SOLN: 5.0000 mg | Freq: Three times a day (TID) | INTRAMUSCULAR | Status: DC | PRN

## 2013-06-03 MED ORDER — HYDROMORPHONE HCL PF 1 MG/ML IJ SOLN
INTRAMUSCULAR | Status: AC
  Administered 2013-06-03: 0.5 mg via INTRAVENOUS
  Filled 2013-06-03: qty 1

## 2013-06-03 MED ORDER — PHENOL 1.4 % MT LIQD: 1.0000 | OROMUCOSAL | Status: DC | PRN

## 2013-06-03 MED ORDER — DICYCLOMINE HCL 10 MG PO CAPS
10.0000 mg | ORAL_CAPSULE | Freq: Two times a day (BID) | ORAL | Status: DC
  Administered 2013-06-03 – 2013-06-04 (×2): 10 mg via ORAL
  Filled 2013-06-03 (×3): qty 1

## 2013-06-03 MED ORDER — DOCUSATE SODIUM 100 MG PO CAPS
100.0000 mg | ORAL_CAPSULE | Freq: Two times a day (BID) | ORAL | Status: DC
  Administered 2013-06-03 – 2013-06-04 (×2): 100 mg via ORAL
  Filled 2013-06-03 (×2): qty 1

## 2013-06-03 MED ORDER — METHOCARBAMOL 100 MG/ML IJ SOLN: 500.0000 mg | Freq: Four times a day (QID) | INTRAMUSCULAR | Status: DC | PRN

## 2013-06-03 MED ORDER — FENTANYL CITRATE 0.05 MG/ML IJ SOLN
INTRAMUSCULAR | Status: AC
  Administered 2013-06-03: 100 ug via INTRAVENOUS
  Filled 2013-06-03: qty 2

## 2013-06-03 MED ORDER — ACETAMINOPHEN 650 MG RE SUPP: 650.0000 mg | Freq: Four times a day (QID) | RECTAL | Status: DC | PRN

## 2013-06-03 MED ORDER — INSULIN DETEMIR 100 UNIT/ML ~~LOC~~ SOLN
35.0000 [IU] | Freq: Every day | SUBCUTANEOUS | Status: DC
  Administered 2013-06-04: 35 [IU] via SUBCUTANEOUS
  Filled 2013-06-03: qty 0.35

## 2013-06-03 MED ORDER — CELECOXIB 200 MG PO CAPS
200.0000 mg | ORAL_CAPSULE | Freq: Two times a day (BID) | ORAL | Status: DC
  Administered 2013-06-03 – 2013-06-04 (×2): 200 mg via ORAL
  Filled 2013-06-03 (×3): qty 1

## 2013-06-03 MED ORDER — SENNOSIDES-DOCUSATE SODIUM 8.6-50 MG PO TABS: 1.0000 | ORAL_TABLET | Freq: Every evening | ORAL | Status: DC | PRN

## 2013-06-03 MED ORDER — HYDROMORPHONE HCL PF 1 MG/ML IJ SOLN
0.2500 mg | INTRAMUSCULAR | Status: DC | PRN
  Administered 2013-06-03: 0.5 mg via INTRAVENOUS

## 2013-06-03 MED ORDER — BISACODYL 5 MG PO TBEC: 5.0000 mg | DELAYED_RELEASE_TABLET | Freq: Every day | ORAL | Status: DC | PRN

## 2013-06-03 MED ORDER — ENOXAPARIN SODIUM 30 MG/0.3ML ~~LOC~~ SOLN
30.0000 mg | Freq: Two times a day (BID) | SUBCUTANEOUS | Status: DC
  Administered 2013-06-04: 30 mg via SUBCUTANEOUS
  Filled 2013-06-03 (×3): qty 0.3

## 2013-06-03 MED ORDER — BUPIVACAINE-EPINEPHRINE PF 0.5-1:200000 % IJ SOLN
INTRAMUSCULAR | Status: DC | PRN
  Administered 2013-06-03: 150 mg

## 2013-06-03 MED ORDER — MIDAZOLAM HCL 5 MG/ML IJ SOLN: 2.0000 mg | Freq: Once | INTRAMUSCULAR | Status: AC

## 2013-06-03 MED ORDER — OXYCODONE HCL ER 10 MG PO T12A
10.0000 mg | EXTENDED_RELEASE_TABLET | Freq: Two times a day (BID) | ORAL | Status: DC
  Administered 2013-06-03 – 2013-06-04 (×2): 10 mg via ORAL
  Filled 2013-06-03 (×2): qty 1

## 2013-06-03 MED ORDER — PROMETHAZINE HCL 25 MG/ML IJ SOLN: 6.2500 mg | INTRAMUSCULAR | Status: DC | PRN

## 2013-06-03 MED ORDER — PANTOPRAZOLE SODIUM 40 MG PO TBEC
40.0000 mg | DELAYED_RELEASE_TABLET | Freq: Every day | ORAL | Status: DC
  Administered 2013-06-03 – 2013-06-04 (×2): 40 mg via ORAL
  Filled 2013-06-03 (×2): qty 1

## 2013-06-03 MED ORDER — VALSARTAN-HYDROCHLOROTHIAZIDE 80-12.5 MG PO TABS: 1.0000 | ORAL_TABLET | Freq: Every day | ORAL | Status: DC

## 2013-06-03 SURGICAL SUPPLY — 60 items
BANDAGE ELASTIC 6 VELCRO ST LF (GAUZE/BANDAGES/DRESSINGS) ×1 IMPLANT
BANDAGE ESMARK 6X9 LF (GAUZE/BANDAGES/DRESSINGS) ×1 IMPLANT
BLADE SAGITTAL 13X1.27X60 (BLADE) ×2 IMPLANT
BLADE SAW SGTL 83.5X18.5 (BLADE) ×2 IMPLANT
BNDG CMPR 9X6 STRL LF SNTH (GAUZE/BANDAGES/DRESSINGS) ×1
BNDG ESMARK 6X9 LF (GAUZE/BANDAGES/DRESSINGS) ×2
BOWL SMART MIX CTS (DISPOSABLE) ×2 IMPLANT
CAP CAP FEM/CEM TIBIA/STD ×2 IMPLANT
CEMENT BONE SIMPLEX SPEEDSET (Cement) ×4 IMPLANT
CLOTH BEACON ORANGE TIMEOUT ST (SAFETY) ×2 IMPLANT
COVER SURGICAL LIGHT HANDLE (MISCELLANEOUS) ×2 IMPLANT
CUFF TOURNIQUET SINGLE 34IN LL (TOURNIQUET CUFF) ×2 IMPLANT
DRAPE EXTREMITY T 121X128X90 (DRAPE) ×2 IMPLANT
DRAPE INCISE IOBAN 66X45 STRL (DRAPES) ×4 IMPLANT
DRAPE PROXIMA HALF (DRAPES) ×2 IMPLANT
DRAPE U-SHAPE 47X51 STRL (DRAPES) ×2 IMPLANT
DRSG ADAPTIC 3X8 NADH LF (GAUZE/BANDAGES/DRESSINGS) ×2 IMPLANT
DRSG PAD ABDOMINAL 8X10 ST (GAUZE/BANDAGES/DRESSINGS) ×2 IMPLANT
DURAPREP 26ML APPLICATOR (WOUND CARE) ×4 IMPLANT
ELECT REM PT RETURN 9FT ADLT (ELECTROSURGICAL) ×2
ELECTRODE REM PT RTRN 9FT ADLT (ELECTROSURGICAL) ×1 IMPLANT
EVACUATOR 1/8 PVC DRAIN (DRAIN) ×2 IMPLANT
GLOVE BIOGEL M 7.0 STRL (GLOVE) IMPLANT
GLOVE BIOGEL PI IND STRL 7.5 (GLOVE) IMPLANT
GLOVE BIOGEL PI IND STRL 8.5 (GLOVE) ×1 IMPLANT
GLOVE BIOGEL PI INDICATOR 7.5 (GLOVE)
GLOVE BIOGEL PI INDICATOR 8.5 (GLOVE) ×1
GLOVE BIOGEL PI ORTHO PRO SZ8 (GLOVE)
GLOVE PI ORTHO PRO STRL SZ8 (GLOVE) ×1 IMPLANT
GLOVE SURG ORTHO 8.0 STRL STRW (GLOVE) ×2 IMPLANT
GLOVE SURG SS PI 8.0 STRL IVOR (GLOVE) ×4 IMPLANT
GOWN PREVENTION PLUS XLARGE (GOWN DISPOSABLE) ×6 IMPLANT
GOWN STRL NON-REIN LRG LVL3 (GOWN DISPOSABLE) ×2 IMPLANT
HANDPIECE INTERPULSE COAX TIP (DISPOSABLE) ×2
HOOD PEEL AWAY FACE SHEILD DIS (HOOD) ×7 IMPLANT
KIT BASIN OR (CUSTOM PROCEDURE TRAY) ×2 IMPLANT
KIT ROOM TURNOVER OR (KITS) ×2 IMPLANT
MANIFOLD NEPTUNE II (INSTRUMENTS) ×2 IMPLANT
NDL HYPO 21X1.5 SAFETY (NEEDLE) ×1 IMPLANT
NEEDLE 22X1 1/2 (OR ONLY) (NEEDLE) ×1 IMPLANT
NEEDLE HYPO 21X1.5 SAFETY (NEEDLE) ×2 IMPLANT
NS IRRIG 1000ML POUR BTL (IV SOLUTION) ×2 IMPLANT
PACK TOTAL JOINT (CUSTOM PROCEDURE TRAY) ×2 IMPLANT
PAD ARMBOARD 7.5X6 YLW CONV (MISCELLANEOUS) ×4 IMPLANT
PADDING CAST COTTON 6X4 STRL (CAST SUPPLIES) ×2 IMPLANT
SET HNDPC FAN SPRY TIP SCT (DISPOSABLE) ×1 IMPLANT
SPONGE GAUZE 4X4 12PLY (GAUZE/BANDAGES/DRESSINGS) ×2 IMPLANT
STAPLER VISISTAT 35W (STAPLE) ×2 IMPLANT
SUCTION FRAZIER TIP 10 FR DISP (SUCTIONS) ×2 IMPLANT
SUT BONE WAX W31G (SUTURE) ×2 IMPLANT
SUT VIC AB 0 CTB1 27 (SUTURE) ×4 IMPLANT
SUT VIC AB 1 CT1 27 (SUTURE) ×4
SUT VIC AB 1 CT1 27XBRD ANBCTR (SUTURE) ×2 IMPLANT
SUT VIC AB 2-0 CT1 27 (SUTURE) ×4
SUT VIC AB 2-0 CT1 TAPERPNT 27 (SUTURE) ×2 IMPLANT
SYR CONTROL 10ML LL (SYRINGE) ×1 IMPLANT
TOWEL OR 17X24 6PK STRL BLUE (TOWEL DISPOSABLE) ×2 IMPLANT
TOWEL OR 17X26 10 PK STRL BLUE (TOWEL DISPOSABLE) ×2 IMPLANT
TRAY FOLEY CATH 16FRSI W/METER (SET/KITS/TRAYS/PACK) ×2 IMPLANT
WATER STERILE IRR 1000ML POUR (IV SOLUTION) ×3 IMPLANT

## 2013-06-03 NOTE — Anesthesia Preprocedure Evaluation (Addendum)
Anesthesia Evaluation    Reviewed: Allergy & Precautions, H&P , NPO status , Patient's Chart, lab work & pertinent test results, reviewed documented beta blocker date and time   History of Anesthesia Complications (+) PONV  Airway Mallampati: II      Dental  (+) Teeth Intact and Dental Advisory Given   Pulmonary neg pulmonary ROS,          Cardiovascular hypertension, Pt. on medications negative cardio ROS      Neuro/Psych negative neurological ROS  negative psych ROS   GI/Hepatic Neg liver ROS, GERD-  Medicated,  Endo/Other  diabetes, Insulin DependentMorbid obesity  Renal/GU negative Renal ROS     Musculoskeletal   Abdominal (+) + obese,   Peds  Hematology   Anesthesia Other Findings   Reproductive/Obstetrics                          Anesthesia Physical Anesthesia Plan  ASA: II  Anesthesia Plan: General   Post-op Pain Management:    Induction: Intravenous  Airway Management Planned: LMA  Additional Equipment:   Intra-op Plan:   Post-operative Plan: Extubation in OR  Informed Consent: I have reviewed the patients History and Physical, chart, labs and discussed the procedure including the risks, benefits and alternatives for the proposed anesthesia with the patient or authorized representative who has indicated his/her understanding and acceptance.   Dental advisory given  Plan Discussed with: CRNA, Anesthesiologist and Surgeon  Anesthesia Plan Comments:        Anesthesia Quick Evaluation

## 2013-06-03 NOTE — Progress Notes (Signed)
UR COMPLETED  

## 2013-06-03 NOTE — Transfer of Care (Signed)
Immediate Anesthesia Transfer of Care Note  Patient: Alexis Choi  Procedure(s) Performed: Procedure(s): LEFT TOTAL KNEE ARTHROPLASTY (Left)  Patient Location: PACU  Anesthesia Type:GA combined with regional for post-op pain  Level of Consciousness: awake and alert   Airway & Oxygen Therapy: Patient Spontanous Breathing and Patient connected to nasal cannula oxygen  Post-op Assessment: Report given to PACU RN and Post -op Vital signs reviewed and stable  Post vital signs: Reviewed and stable  Complications: No apparent anesthesia complications

## 2013-06-03 NOTE — Progress Notes (Signed)
Orthopedic Tech Progress Note Patient Details:  Alexis Choi [birth year] [id number] Applied CPM to LLE.  Applied OHF with trapeze to bed. CPM Left Knee CPM Left Knee: On Left Knee Flexion (Degrees): 90 Left Knee Extension (Degrees): 0   [name] 06/03/2013, 1:35 PM

## 2013-06-03 NOTE — Anesthesia Postprocedure Evaluation (Signed)
Anesthesia Post Note  Patient: Alexis Choi  Procedure(s) Performed: Procedure(s) (LRB): LEFT TOTAL KNEE ARTHROPLASTY (Left)  Anesthesia type: general  Patient location: PACU  Post pain: Pain level controlled  Post assessment: Patient's Cardiovascular Status Stable  Last Vitals:  Filed Vitals:   06/03/13 1345  BP: 175/73  Pulse: 91  Temp:   Resp: 16    Post vital signs: Reviewed and stable  Level of consciousness: sedated  Complications: No apparent anesthesia complications

## 2013-06-03 NOTE — Preoperative (Signed)
Beta Blockers   Reason not to administer Beta Blockers:Not Applicable. No home beta blockers 

## 2013-06-03 NOTE — H&P (Signed)
  Alexis Choi MRN:  [id number] DOB/SEX:  05/19/1956/female  CHIEF COMPLAINT:  Painful left Knee  HISTORY: Patient is a 57 y.o. female presented with a history of pain in the left knee. Onset of symptoms was gradual starting several years ago with gradually worsening course since that time. Prior procedures on the knee include none. Patient has been treated conservatively with over-the-counter NSAIDs and activity modification. Patient currently rates pain in the knee at 10 out of 10 with activity. There is pain at night.  PAST MEDICAL HISTORY: There are no active problems to display for this patient.  Past Medical History  Diagnosis Date  . Diabetes mellitus   . Hyperlipidemia   . Encounter for long-term (current) use of other medications   . Unspecified vitamin D deficiency   . Edema   . Fluid retention   . Osteoarthritis of knee   . PONV (postoperative nausea and vomiting)     occ  . Dysrhythmia     pat  . GERD (gastroesophageal reflux disease)    Past Surgical History  Procedure Laterality Date  . Right knee arthroscopy    . Knee replacment Right   . Tonsillectomy  63  . Tubal ligation  90  . Knee arthroscopy Left 06     MEDICATIONS:   No prescriptions prior to admission    ALLERGIES:   Allergies  Allergen Reactions  . Latex Itching    Itch,rash with bandaides  . Eggs Or Egg-Derived Products Other (See Comments)    Reaction unknown  . Metformin And Related Other (See Comments)    Reaction unknown    REVIEW OF SYSTEMS:  Pertinent items are noted in HPI.   FAMILY HISTORY:  No family history on file.  SOCIAL HISTORY:   History  Substance Use Topics  . Smoking status: Never Smoker   . Smokeless tobacco: Not on file  . Alcohol Use: No     EXAMINATION:  Vital signs in last 24 hours:    General appearance: alert, cooperative and no distress Lungs: clear to auscultation bilaterally Heart: regular rate and rhythm, S1, S2 normal, no murmur, click,  rub or gallop Abdomen: soft, non-tender; bowel sounds normal; no masses,  no organomegaly Extremities: extremities normal, atraumatic, no cyanosis or edema and Homans sign is negative, no sign of DVT Pulses: 2+ and symmetric Skin: Skin color, texture, turgor normal. No rashes or lesions Neurologic: Alert and oriented X 3, normal strength and tone. Normal symmetric reflexes. Normal coordination and gait  Musculoskeletal:  ROM 0-110, Ligaments intact,  Imaging Review Plain radiographs demonstrate severe degenerative joint disease of the left knee. The overall alignment is mild varus. The bone quality appears to be good for age and reported activity level.  Assessment/Plan: End stage arthritis, left knee   The patient history, physical examination and imaging studies are consistent with advanced degenerative joint disease of the left knee. The patient has failed conservative treatment.  The clearance notes were reviewed.  After discussion with the patient it was felt that Total Knee Replacement was indicated. The procedure,  risks, and benefits of total knee arthroplasty were presented and reviewed. The risks including but not limited to aseptic loosening, infection, blood clots, vascular injury, stiffness, patella tracking problems complications among others were discussed. The patient acknowledged the explanation, agreed to proceed with the plan.  Mychele Seyller 06/03/2013, 7:02 AM

## 2013-06-03 NOTE — Evaluation (Signed)
Physical Therapy Evaluation Patient Details Name: Alexis Choi MRN: [id number] DOB: 1956-04-30 Today's Date: 06/03/2013 Time: 4098-1191 PT Time Calculation (min): 35 min  PT Assessment / Plan / Recommendation History of Present Illness  Patient is a 57 yo female s/p Lt. TKA.  Clinical Impression  Patient presents with problems listed below.  Will benefit from acute PT to maximize independence prior to return home with husband.      PT Assessment  Patient needs continued PT services    Follow Up Recommendations  Home health PT;Supervision/Assistance - 24 hour    Does the patient have the potential to tolerate intense rehabilitation      Barriers to Discharge        Equipment Recommendations  None recommended by PT    Recommendations for Other Services     Frequency 7X/week    Precautions / Restrictions Precautions Precautions: Knee Precaution Booklet Issued: Yes (comment) Precaution Comments: Provided patient with education on precautions. Restrictions Weight Bearing Restrictions: Yes LLE Weight Bearing: Weight bearing as tolerated   Pertinent Vitals/Pain Pain and N/V limiting mobility today.      Mobility  Bed Mobility Bed Mobility: Supine to Sit;Sitting - Scoot to Edge of Bed Supine to Sit: 4: Min assist;With rails;HOB elevated Sitting - Scoot to Edge of Bed: 4: Min guard Details for Bed Mobility Assistance: Verbal cues for technique. Assist to move LLE off of bed.  Patient sat at EOB x 8 minutes with good balance. Transfers Transfers: Sit to Stand;Stand to Sit;Stand Pivot Transfers Sit to Stand: 3: Mod assist;With upper extremity assist;From bed Stand to Sit: 3: Mod assist;With upper extremity assist;With armrests;To chair/3-in-1 Stand Pivot Transfers: 3: Mod assist Details for Transfer Assistance: Verbal cues for hand placement and technique.  Assist to rise to standing.  Patient able to take a few hop steps to pivot to chair.  Assist to control descent  to chair. Ambulation/Gait Ambulation/Gait Assistance: Not tested (comment)    Exercises Total Joint Exercises Ankle Circles/Pumps: AROM;Both;10 reps;Seated   PT Diagnosis: Difficulty walking;Generalized weakness;Acute pain  PT Problem List: Decreased strength;Decreased range of motion;Decreased activity tolerance;Decreased balance;Decreased mobility;Decreased knowledge of use of DME;Decreased knowledge of precautions;Obesity;Pain PT Treatment Interventions: DME instruction;Gait training;Stair training;Functional mobility training;Therapeutic exercise;Patient/family education     PT Goals(Current goals can be found in the care plan section) Acute Rehab PT Goals Patient Stated Goal: To be able to get home. PT Goal Formulation: With patient Time For Goal Achievement: 06/10/13 Potential to Achieve Goals: Good  Visit Information  Last PT Received On: 06/03/13 Assistance Needed: +2 History of Present Illness: Patient is a 57 yo female s/p Lt. TKA.       Prior Functioning  Home Living Family/patient expects to be discharged to:: Private residence Living Arrangements: Spouse/significant other Available Help at Discharge: Family;Available 24 hours/day Type of Home: House Home Access: Stairs to enter [employer] of Steps: 6 Entrance Stairs-Rails: Right Home Layout: One level Home Equipment: Walker - 2 wheels;Cane - single point;Shower seat;Bedside commode Prior Function Level of Independence: Independent with assistive device(s) (Using cane) Communication Communication: No difficulties    Cognition  Cognition Arousal/Alertness: Awake/alert Behavior During Therapy: WFL for tasks assessed/performed Overall Cognitive Status: Within Functional Limits for tasks assessed    Extremity/Trunk Assessment Upper Extremity Assessment Upper Extremity Assessment: Overall WFL for tasks assessed Lower Extremity Assessment Lower Extremity Assessment: LLE deficits/detail LLE  Deficits / Details: Able to assist with moving LLE off of bed. LLE: Unable to fully assess due to pain Cervical /  Trunk Assessment Cervical / Trunk Assessment: Normal   Balance    End of Session PT - End of Session Equipment Utilized During Treatment: Gait belt;Oxygen Activity Tolerance: Patient limited by pain;Patient limited by fatigue Patient left: in chair;with call bell/phone within reach;with family/visitor present Nurse Communication: Mobility status;Patient requests pain meds CPM Left Knee CPM Left Knee: Off (off at 19:15)  GP     Vena Austria 06/03/2013, 7:58 PM Durenda Hurt. Renaldo Fiddler, [facility] Acute Rehab Services Pager [phone]

## 2013-06-03 NOTE — Anesthesia Procedure Notes (Signed)
Anesthesia Regional Block:  Femoral nerve block  Pre-Anesthetic Checklist: ,, timeout performed, Correct Patient, Correct Site, Correct Laterality, Correct Procedure, Correct Position, site marked, Risks and benefits discussed,  Surgical consent,  Pre-op evaluation,  At surgeon's request and post-op pain management  Laterality: Left  Prep: chloraprep       Needles:  Injection technique: Single-shot  Needle Type: Echogenic Stimulator Needle     Needle Length: 5cm 5 cm Needle Gauge: 22 and 22 G    Additional Needles:  Procedures: ultrasound guided (picture in chart) and nerve stimulator Femoral nerve block  Nerve Stimulator or Paresthesia:  Response: quadraceps contraction, 0.45 mA,   Additional Responses:   Narrative:  Start time: 06/03/2013 9:02 AM End time: 06/03/2013 9:12 AM Injection made incrementally with aspirations every 5 mL.  Performed by: Personally  Anesthesiologist: Halford Decamp, MD  Additional Notes: Functioning IV was confirmed and monitors were applied.  A 50mm 22ga Arrow echogenic stimulator needle was used. Sterile prep and drape,hand hygiene and sterile gloves were used. Ultrasound guidance: relevant anatomy identified, needle position confirmed, local anesthetic spread visualized around nerve(s)., vascular puncture avoided.  Image printed for medical record. Negative aspiration and negative test dose prior to incremental administration of local anesthetic. The patient tolerated the procedure well.    Femoral nerve block

## 2013-06-03 NOTE — Progress Notes (Signed)
Orthopedic Tech Progress Note Patient Details:  Alexis Choi 10-03-1956 [id number] Footsie rolls are no longer available. Patient ID: Alexis Choi, female   DOB: November 09, 1956, 57 y.o.   MRN: [id number]   [name] 06/03/2013, 1:36 PM

## 2013-06-04 ENCOUNTER — Encounter (HOSPITAL_COMMUNITY): Payer: Self-pay | Admitting: Orthopedic Surgery

## 2013-06-04 LAB — CBC
Hemoglobin: 11.4 g/dL — ABNORMAL LOW (ref 12.0–15.0)
MCH: 28.2 pg (ref 26.0–34.0)
MCHC: 32.6 g/dL (ref 30.0–36.0)
Platelets: 212 10*3/uL (ref 150–400)

## 2013-06-04 LAB — BASIC METABOLIC PANEL
Calcium: 8.7 mg/dL (ref 8.4–10.5)
GFR calc non Af Amer: >90 mL/min (ref >90)
Glucose, Bld: 163 mg/dL — ABNORMAL HIGH (ref 70–99)
Sodium: 135 mEq/L (ref 135–145)

## 2013-06-04 LAB — GLUCOSE, CAPILLARY
Glucose-Capillary: 155 mg/dL — ABNORMAL HIGH (ref 70–99)
Glucose-Capillary: 307 mg/dL — ABNORMAL HIGH (ref 70–99)

## 2013-06-04 LAB — HEMOGLOBIN A1C: Mean Plasma Glucose: 160 mg/dL — ABNORMAL HIGH (ref <117)

## 2013-06-04 MED ORDER — OXYCODONE HCL ER 10 MG PO T12A: 10.0000 mg | EXTENDED_RELEASE_TABLET | Freq: Two times a day (BID) | ORAL | Status: DC

## 2013-06-04 MED ORDER — METHOCARBAMOL 500 MG PO TABS: 500.0000 mg | ORAL_TABLET | Freq: Four times a day (QID) | ORAL | Status: DC | PRN

## 2013-06-04 MED ORDER — ENOXAPARIN SODIUM 40 MG/0.4ML ~~LOC~~ SOLN: 40.0000 mg | SUBCUTANEOUS | Status: DC

## 2013-06-04 MED ORDER — OXYCODONE HCL 5 MG PO TABS: 5.0000 mg | ORAL_TABLET | ORAL | Status: DC | PRN

## 2013-06-04 NOTE — Progress Notes (Signed)
Physical Therapy Note  Covered stair training and bed mobilty in high bed; Husband present and gave appropriate assist;  OK for dc home from a functional mobility standpoint  Pain 2/10 R knee; was premedicated for pain   06/04/13 1500  PT Visit Information  Last PT Received On 06/04/13  Assistance Needed +1  History of Present Illness Patient is a 57 yo female s/p Lt. TKA.  PT Time Calculation  PT Start Time 1331  PT Stop Time 1400  PT Time Calculation (min) 29 min  Subjective Data  Subjective Wanting to dc home; reports husband is very helpful  Patient Stated Goal To be able to get home.  Precautions  Precautions Knee  Precaution Booklet Issued Yes (comment)  Precaution Comments Provided patient with education on precautions.  Restrictions  Weight Bearing Restrictions Yes  LLE Weight Bearing WBAT  Cognition  Arousal/Alertness Awake/alert  Behavior During Therapy WFL for tasks assessed/performed  Overall Cognitive Status Within Functional Limits for tasks assessed  Bed Mobility  Bed Mobility Supine to Sit;Sit to Supine  Supine to Sit 4: Min guard  Sit to Supine 4: Min assist  Details for Bed Mobility Assistance Min assist to help LLE into bed; Practiced with bed elevated to approximate bed at home; Husband gave appropriate assist  Transfers  Transfers Sit to Stand;Stand to Sit  Sit to Stand With upper extremity assist;From bed;4: Min guard  Stand to Sit With upper extremity assist;With armrests;To chair/3-in-1;4: Min guard  Details for Transfer Assistance Cues for hand placement and safety  Ambulation/Gait  Ambulation/Gait Assistance 4: Min guard  Ambulation Distance (Feet) 100 Feet  Assistive device Rolling walker  Ambulation/Gait Assistance Details Cues to activate L quad in stance; no noted knee buckle; pt seems much more confident  Gait Pattern Step-to pattern  Stairs Yes  Stairs Assistance 4: Min assist  Stairs Assistance Details (indicate cue type and reason)  verbal and demo cues for technique ot ascend/descend steps backwards with RW; Pt and husband performed 2 steps correctly and safely  Stair Management Technique No rails;With walker;Backwards  Number of Stairs 2  Exercises  Exercises Total Joint  Total Joint Exercises  Ankle Circles/Pumps AROM;Both;10 reps;Seated  Quad Sets AROM;Left;10 reps  Long [address] [place];Left;10 reps  PT - End of Session  Equipment Utilized During Treatment Gait belt  Activity Tolerance Patient tolerated treatment well  Patient left in chair;with call bell/phone within reach;with family/visitor present  Nurse Communication Mobility status  PT - Assessment/Plan  PT Plan Current plan remains appropriate  PT Frequency 7X/week  Follow Up Recommendations Home health PT;Supervision/Assistance - 24 hour  PT equipment None recommended by PT  PT Goal Progression  Progress towards PT goals Progressing toward goals  Acute Rehab PT Goals  PT Goal Formulation With patient  Time For Goal Achievement 06/10/13  Potential to Achieve Goals Good  PT General Charges  $$ ACUTE PT VISIT 1 Procedure  PT Treatments  $Gait Training 8-22 mins  $Therapeutic Activity 8-22 mins   [place], [place] [phone]

## 2013-06-04 NOTE — Op Note (Signed)
TOTAL KNEE REPLACEMENT OPERATIVE NOTE:  06/03/2013  2:35 PM  PATIENT:  Alexis Choi  57 y.o. female  PRE-OPERATIVE DIAGNOSIS:  osteoarthritis left knee  POST-OPERATIVE DIAGNOSIS:  osteoarthritis left knee  PROCEDURE:  Procedure(s): LEFT TOTAL KNEE ARTHROPLASTY  SURGEON:  Surgeon(s): Dannielle Huh, MD  PHYSICIAN ASSISTANT: Altamese Cabal, [facility]  ANESTHESIA:   general  DRAINS: Hemovac and On-Q Marcaine Pain Pump  SPECIMEN: None  COUNTS:  Correct  TOURNIQUET:   Total Tourniquet Time Documented: Thigh (Left) - 54 minutes Total: Thigh (Left) - 54 minutes   DICTATION:  Indication for procedure:    The patient is a 57 y.o. female who has failed conservative treatment for osteoarthritis left knee.  Informed consent was obtained prior to anesthesia. The risks versus benefits of the operation were explain and in a way the patient can, and did, understand.   Description of procedure:     The patient was taken to the operating room and placed under anesthesia.  The patient was positioned in the usual fashion taking care that all body parts were adequately padded and/or protected.  I foley catheter was placed.  A tourniquet was applied and the leg prepped and draped in the usual sterile fashion.  The extremity was exsanguinated with the esmarch and tourniquet inflated to 350 mmHg.  Pre-operative range of motion was normal.  The knee was in 5 degree of mild varus.  A midline incision approximately 6-7 inches long was made with a #10 blade.  A new blade was used to make a parapatellar arthrotomy going 2-3 cm into the quadriceps tendon, over the patella, and alongside the medial aspect of the patellar tendon.  A synovectomy was then performed with the #10 blade and forceps. I then elevated the deep MCL off the medial tibial metaphysis subperiosteally around to the semimembranosus attachment.    I everted the patella and used calipers to measure patellar thickness.  I used the reamer to  ream down to appropriate thickness to recreate the native thickness.  I then removed excess bone with the rongeur and sagittal saw.  I used the appropriately sized template and drilled the three lug holes.  I then put the trial in place and measured the thickness with the calipers to ensure recreation of the native thickness.  The trial was then removed and the patella subluxed and the knee brought into flexion.  A homan retractor was place to retract and protect the patella and lateral structures.  A Z-retractor was place medially to protect the medial structures.  The extra-medullary alignment system was used to make cut the tibial articular surface perpendicular to the anamotic axis of the tibia and in 3 degrees of posterior slope.  The cut surface and alignment jig was removed.  I then used the intramedullary alignment guide to make a 6 valgus cut on the distal femur.  I then marked out the epicondylar axis on the distal femur.  The posterior condylar axis measured 3 degrees.  I then used the anterior referencing sizer and measured the femur to be a size E.  The 4-In-1 cutting block was screwed into place in external rotation matching the posterior condylar angle, making our cuts perpendicular to the epicondylar axis.  Anterior, posterior and chamfer cuts were made with the sagittal saw.  The cutting block and cut pieces were removed.  A lamina spreader was placed in 90 degrees of flexion.  The ACL, PCL, menisci, and posterior condylar osteophytes were removed.  A 12 mm spacer  blocked was found to offer good flexion and extension gap balance after minimal in degree releasing.   The scoop retractor was then placed and the femoral finishing block was pinned in place.  The small sagittal saw was used as well as the lug drill to finish the femur.  The block and cut surfaces were removed and the medullary canal hole filled with autograft bone from the cut pieces.  The tibia was delivered forward in deep  flexion and external rotation.  A size 4 tray was selected and pinned into place centered on the medial 1/3 of the tibial tubercle.  The reamer and keel was used to prepare the tibia through the tray.    I then trialed with the size E femur, size 4 tibia, a 12 mm insert and the 32 patella.  I had excellent flexion/extension gap balance, excellent patella tracking.  Flexion was full and beyond 120 degrees; extension was zero.  These components were chosen and the staff opened them to me on the back table while the knee was lavaged copiously and the cement mixed.  I cemented in the components and removed all excess cement.  The polyethylene tibial component was snapped into place and the knee placed in extension while cement was hardening.  The capsule was infilltrated with 20cc of .25% Marcaine with epinephrine.  A hemovac was place in the joint exiting superolaterally.  A pain pump was place superomedially superficial to the arthrotomy.  Once the cement was hard, the tourniquet was let down.  Hemostasis was obtained.  The arthrotomy was closed with figure-8 #1 vicryl sutures.  The deep soft tissues were closed with #0 vicryls and the subcuticular layer closed with a running #2-0 vicryl.  The skin was reapproximated and closed with skin staples.  The wound was dressed with xeroform, 4 x4's, 2 ABD sponges, a single layer of webril and a TED stocking.   The patient was then awakened, extubated, and taken to the recovery room in stable condition.  BLOOD LOSS:  300cc DRAINS: 1 hemovac, 1 pain catheter COMPLICATIONS:  None.  PLAN OF CARE: Admit to inpatient   PATIENT DISPOSITION:  PACU - hemodynamically stable.   Delay start of Pharmacological VTE agent (>24hrs) due to surgical blood loss or risk of bleeding:  not applicable  Please fax a copy of this op note to my office at [phone] (please only include page 1 and 2 of the Case Information op note)

## 2013-06-04 NOTE — Discharge Summary (Signed)
SPORTS MEDICINE & JOINT REPLACEMENT   Georgena Spurling, MD   Altamese Cabal, PA-C [address] [place], [place], [place]  [address]                             [phone]  PATIENT ID: Alexis Choi        MRN:  [id number]          DOB/AGE: 1956/09/21 / 57 y.o.    DISCHARGE SUMMARY  ADMISSION DATE:    06/03/2013 DISCHARGE DATE:   06/04/2013   ADMISSION DIAGNOSIS: osteoarthritis left knee    DISCHARGE DIAGNOSIS:  osteoarthritis left knee    ADDITIONAL DIAGNOSIS: Active Problems:   * No active hospital problems. *  Past Medical History  Diagnosis Date  . Diabetes mellitus   . Hyperlipidemia   . Encounter for long-term (current) use of other medications   . Unspecified vitamin D deficiency   . Edema   . Fluid retention   . Osteoarthritis of knee   . PONV (postoperative nausea and vomiting)     occ  . Dysrhythmia     pat  . GERD (gastroesophageal reflux disease)     PROCEDURE: Procedure(s): LEFT TOTAL KNEE ARTHROPLASTY on 06/03/2013  CONSULTS:     HISTORY:  See H&P in chart  HOSPITAL COURSE:  Alexis Choi is a 57 y.o. admitted on 06/03/2013 and found to have a diagnosis of osteoarthritis left knee.  After appropriate laboratory studies were obtained  they were taken to the operating room on 06/03/2013 and underwent Procedure(s): LEFT TOTAL KNEE ARTHROPLASTY.   They were given perioperative antibiotics:  Anti-infectives   Start     Dose/Rate Route Frequency Ordered Stop   06/03/13 1630  ceFAZolin (ANCEF) IVPB 2 g/50 mL premix     2 g 100 mL/hr over 30 Minutes Intravenous Every 6 hours 06/03/13 1500 06/03/13 2259   06/03/13 0600  ceFAZolin (ANCEF) IVPB 2 g/50 mL premix     2 g 100 mL/hr over 30 Minutes Intravenous On call to O.R. 06/02/13 1434 06/03/13 1026    .  Tolerated the procedure well.  Placed with a foley intraoperatively.  Given Ofirmev at induction and for 48 hours.    POD# 1: Vital signs were stable.  Patient denied Chest pain, shortness of  breath, or calf pain.  Patient was started on Lovenox 30 mg subcutaneously twice daily at 8am.  Consults to PT, OT, and care management were made.  The patient was weight bearing as tolerated.  CPM was placed on the operative leg 0-90 degrees for 6-8 hours a day.  Incentive spirometry was taught.  Dressing was changed.  Hemovac was discontinued.      POD #2, Continued  PT for ambulation and exercise program.  IV saline locked.  O2 discontinued.    The remainder of the hospital course was dedicated to ambulation and strengthening.   The patient was discharged on 1 Day Post-Op in  Good condition.  Blood products given:none  DIAGNOSTIC STUDIES: Recent vital signs: Patient Vitals for the past 24 hrs:  BP Temp Temp src Pulse Resp SpO2  06/04/13 0620 145/56 mmHg 98.5 F (36.9 C) Oral 90 18 100 %  06/04/13 0142 148/57 mmHg 98.3 F (36.8 C) Oral 89 18 98 %  06/03/13 2042 168/67 mmHg 98.1 F (36.7 C) Oral 101 18 97 %  06/03/13 1600 - - - - 16 97 %  06/03/13 1450 156/68 mmHg - -  95 18 95 %  06/03/13 1421 188/80 mmHg 98.2 F (36.8 C) - 95 18 96 %  06/03/13 1345 175/73 mmHg - - 91 16 96 %  06/03/13 1330 168/63 mmHg 98.3 F (36.8 C) - 90 7 94 %  06/03/13 1315 166/66 mmHg - - 92 14 94 %  06/03/13 1300 183/73 mmHg - - 84 8 98 %  06/03/13 1245 171/86 mmHg - - 93 18 97 %       Recent laboratory studies:  Recent Labs  06/03/13 1941 06/04/13 0500  WBC 20.7* 11.5*  HGB 12.3 11.4*  HCT 37.0 35.0*  PLT 283 212    Recent Labs  06/03/13 1941 06/04/13 0500  NA  --  135  K  --  3.7  CL  --  98  CO2  --  28  BUN  --  9  CREATININE 0.45* 0.46*  GLUCOSE  --  163*  CALCIUM  --  8.7   Lab Results  Component Value Date   INR 0.98 05/27/2013   INR 0.9 05/21/2008     Recent Radiographic Studies :  Dg Chest 2 View  05/27/2013   *RADIOLOGY REPORT*  Clinical Data: Preoperative chest radiograph for left knee replacement, history of dysrhythmia  CHEST - 2 VIEW  Comparison: 05/21/2008  Findings:  The heart size and vascular pattern are normal.  The lungs are clear.  There is no change from prior study.  IMPRESSION: No acute abnormalities   Original Report Authenticated By: Esperanza Heir, M.D.    DISCHARGE INSTRUCTIONS: Discharge Orders   Future Orders Complete By Expires     CPM  As directed     Comments:      Continuous passive motion machine (CPM):      Use the CPM from 0 to 90 for 6-8 hours per day.      You may increase by 10 per day.  You may break it up into 2 or 3 sessions per day.      Use CPM for 2 weeks or until you are told to stop.    Call MD / Call 911  As directed     Comments:      If you experience chest pain or shortness of breath, CALL 911 and be transported to the hospital emergency room.  If you develope a fever above 101 F, pus (white drainage) or increased drainage or redness at the wound, or calf pain, call your surgeon's office.    Change dressing  As directed     Comments:      Change dressing on Wednesday, then change the dressing daily with sterile 4 x 4 inch gauze dressing and apply TED hose    Constipation Prevention  As directed     Comments:      Drink plenty of fluids.  Prune juice may be helpful.  You may use a stool softener, such as Colace (over the counter) 100 mg twice a day.  Use MiraLax (over the counter) for constipation as needed.    Diet - low sodium heart healthy  As directed     Do not put a pillow under the knee. Place it under the heel.  As directed     Driving restrictions  As directed     Comments:      No driving for 6 weeks    Increase activity slowly as tolerated  As directed     Lifting restrictions  As directed     Comments:  No lifting for 6 weeks    TED hose  As directed     Comments:      Use stockings (TED hose) for 3 weeks on both leg(s).  You may remove them at night for sleeping.       DISCHARGE MEDICATIONS:     Medication List    STOP taking these medications       aspirin 81 MG tablet      TAKE  these medications       ACCUSURE INS SYR 1CC/31GX5/16" 31G X 5/16" 1 ML Misc  Generic drug:  Insulin Syringe-Needle U-100  by Does not apply route.     celecoxib 200 MG capsule  Commonly known as:  CELEBREX  Take 200 mg by mouth 2 (two) times daily.     CHOLECALCIFEROL PO  Take 1 tablet by mouth 2 (two) times daily.     dicyclomine 10 MG capsule  Commonly known as:  BENTYL  Take 10 mg by mouth 2 (two) times daily.     enoxaparin 40 MG/0.4ML injection  Commonly known as:  LOVENOX  Inject 0.4 mLs (40 mg total) into the skin daily.     glucose blood test strip  1 each by Other route as needed. Use as instructed     insulin aspart 100 UNIT/ML injection  Commonly known as:  novoLOG  Inject 10-12 Units into the skin 3 (three) times daily with meals. Per sliding scale     insulin detemir 100 UNIT/ML injection  Commonly known as:  LEVEMIR  Inject into the skin at bedtime. 35 units in AM and 70 units in PM     INVOKANA 100 MG Tabs  Generic drug:  Canagliflozin  Take 100 mg by mouth every other day. Alternate with Actos     magnesium gluconate 500 MG tablet  Commonly known as:  MAGONATE  Take 500 mg by mouth daily.     methocarbamol 500 MG tablet  Commonly known as:  ROBAXIN  Take 1-2 tablets (500-1,000 mg total) by mouth every 6 (six) hours as needed.     multivitamin capsule  Take 1 capsule by mouth daily.     omeprazole 20 MG tablet  Commonly known as:  PRILOSEC OTC  Take 20 mg by mouth daily.     oxyCODONE 5 MG immediate release tablet  Commonly known as:  Oxy IR/ROXICODONE  Take 1-2 tablets (5-10 mg total) by mouth every 3 (three) hours as needed.     OxyCODONE 10 mg T12a  Commonly known as:  OXYCONTIN  Take 1 tablet (10 mg total) by mouth every 12 (twelve) hours.     PEN NEEDLES 31GX5/16" 31G X 8 MM Misc  by Does not apply route.     pioglitazone 45 MG tablet  Commonly known as:  ACTOS  Take 45 mg by mouth every other day. Take alternating with Invokana      pravastatin 20 MG tablet  Commonly known as:  PRAVACHOL  Take 20 mg by mouth daily.     TYLENOL ARTHRITIS PAIN 650 MG CR tablet  Generic drug:  acetaminophen  Take 650 mg by mouth every 8 (eight) hours as needed for pain.     valsartan-hydrochlorothiazide 80-12.5 MG per tablet  Commonly known as:  DIOVAN-HCT  Take 1 tablet by mouth daily.     verapamil 120 MG CR tablet  Commonly known as:  CALAN-SR  Take 120 mg by mouth daily.     VICTOZA 18 MG/3ML Sopn  Generic drug:  Liraglutide  Inject 1.8 mLs into the skin daily.     VITAMIN-B COMPLEX PO  Take 500 mg by mouth daily.        FOLLOW UP VISIT:       Follow-up Information   Follow up with Raymon Mutton, MD. Call on 06/18/2013.   Contact information:   [address] [place] [address] [phone]       DISPOSITION: HOME   CONDITION:  Good   Alivya Wegman 06/04/2013, 12:33 PM

## 2013-06-04 NOTE — Progress Notes (Signed)
SPORTS MEDICINE AND JOINT REPLACEMENT  Georgena Spurling, MD   Altamese Cabal, PA-C [address] [place], [place], [place]  [address]                             [phone]   PROGRESS NOTE  Subjective:  negative for Chest Pain  negative for Shortness of Breath  negative for Nausea/Vomiting   negative for Calf Pain  negative for Bowel Movement   Tolerating Diet: yes         Patient reports pain as 4 on 0-10 scale.    Objective: Vital signs in last 24 hours:   Patient Vitals for the past 24 hrs:  BP Temp Temp src Pulse Resp SpO2  06/04/13 0620 145/56 mmHg 98.5 F (36.9 C) Oral 90 18 100 %  06/04/13 0142 148/57 mmHg 98.3 F (36.8 C) Oral 89 18 98 %  06/03/13 2042 168/67 mmHg 98.1 F (36.7 C) Oral 101 18 97 %  06/03/13 1600 - - - - 16 97 %  06/03/13 1450 156/68 mmHg - - 95 18 95 %  06/03/13 1421 188/80 mmHg 98.2 F (36.8 C) - 95 18 96 %  06/03/13 1345 175/73 mmHg - - 91 16 96 %  06/03/13 1330 168/63 mmHg 98.3 F (36.8 C) - 90 7 94 %  06/03/13 1315 166/66 mmHg - - 92 14 94 %  06/03/13 1300 183/73 mmHg - - 84 8 98 %  06/03/13 1245 171/86 mmHg - - 93 18 97 %  06/03/13 1230 185/79 mmHg - - 91 14 95 %    @flow {1959:LAST@   Intake/Output from previous day:   07/14 0701 - 07/15 0700 In: 3150 [P.O.:240; I.V.:2910] Out: 1385 [Urine:1100; Drains:275]   Intake/Output this shift:       Intake/Output     07/14 0701 - 07/15 0700 07/15 0701 - 07/16 0700   P.O. 240    I.V. 2910    Total Intake 3150     Urine 1100    Drains 275    Blood 10    Total Output 1385     Net +1765             LABORATORY DATA:  Recent Labs  06/03/13 1941 06/04/13 0500  WBC 20.7* 11.5*  HGB 12.3 11.4*  HCT 37.0 35.0*  PLT 283 212    Recent Labs  06/03/13 1941 06/04/13 0500  NA  --  135  K  --  3.7  CL  --  98  CO2  --  28  BUN  --  9  CREATININE 0.45* 0.46*  GLUCOSE  --  163*  CALCIUM  --  8.7   Lab Results  Component Value Date   INR 0.98 05/27/2013   INR 0.9 05/21/2008     Examination:  General appearance: alert, cooperative and no distress Extremities: Homans sign is negative, no sign of DVT  Wound Exam: clean, dry, intact   Drainage:  None: wound tissue dry  Motor Exam: EHL and FHL Intact  Sensory Exam: Deep Peroneal normal   Assessment:    1 Day Post-Op  Procedure(s) (LRB): LEFT TOTAL KNEE ARTHROPLASTY (Left)  ADDITIONAL DIAGNOSIS:  Active Problems:   * No active hospital problems. *  Acute Blood Loss Anemia   Plan: Physical Therapy as ordered Weight Bearing as Tolerated (WBAT)  DVT Prophylaxis:  Lovenox  DISCHARGE PLAN: Home  DISCHARGE NEEDS: HHPT, CPM, Walker and 3-in-1 comode seat  Rainie Crenshaw 06/04/2013, 12:25 PM

## 2013-06-04 NOTE — Care Management Note (Signed)
    Page 1 of 1   06/04/2013     4:03:31 PM   CARE MANAGEMENT NOTE 06/04/2013  Patient:  Alexis Choi, Alexis Choi   Account Number:  [id number]  Date Initiated:  06/04/2013  Documentation initiated by:  Jacquelynn Cree  Subjective/Objective Assessment:   admitted left total knee arthroplasty     Action/Plan:   return home with HHPT   Anticipated DC Date:  06/04/2013   Anticipated DC Plan:  HOME W HOME HEALTH SERVICES      DC Planning Services  CM consult      Choice offered to / List presented to:          [facility] arranged  HH-2 PT      [facility] agency  [facility]   Status of service:  Completed, signed off Medicare Important Message given?   (If response is "NO", the following Medicare IM given date fields will be blank) Date Medicare IM given:   Date Additional Medicare IM given:    Discharge Disposition:  HOME W HOME HEALTH SERVICES  Per UR Regulation:    If discussed at Long Length of Stay Meetings, dates discussed:    Comments:  06/04/13 Patient set up with [facility] for HHPT by MD office. Spoke with patient, no change in d/c plan. Patient's cousin will be assisting her with care after d/c. No equipment needs identified.  Jacquelynn Cree RN, BSN, CCM

## 2013-06-04 NOTE — Progress Notes (Signed)
Physical Therapy Treatment Patient Details Name: Alexis Choi MRN: [id number] DOB: 15-Apr-1956 Today's Date: 06/04/2013 Time: 7829-5621 PT Time Calculation (min): 29 min  PT Assessment / Plan / Recommendation  PT Comments   Good progress with mobility; Plan for stair training this afternoon, then likely for dc home  Follow Up Recommendations  Home health PT;Supervision/Assistance - 24 hour     Does the patient have the potential to tolerate intense rehabilitation     Barriers to Discharge        Equipment Recommendations  None recommended by PT    Recommendations for Other Services    Frequency 7X/week   Progress towards PT Goals Progress towards PT goals: Progressing toward goals  Plan Current plan remains appropriate    Precautions / Restrictions Precautions Precautions: Knee Precaution Booklet Issued: Yes (comment) Precaution Comments: Provided patient with education on precautions. Restrictions Weight Bearing Restrictions: Yes LLE Weight Bearing: Weight bearing as tolerated   Pertinent Vitals/Pain 6-7/10 L knee patient repositioned for comfort Ankle Propped to promote extension    Mobility  Bed Mobility Details for Bed Mobility Assistance: Pt EOB with nursing upon enetring room Transfers Transfers: Sit to Stand;Stand to Sit Sit to Stand: With upper extremity assist;From bed;4: Min assist Stand to Sit: With upper extremity assist;With armrests;To chair/3-in-1;4: Min assist Details for Transfer Assistance: Cues for hand placement and safety Ambulation/Gait Ambulation/Gait Assistance: 4: Min assist;4: Min guard Ambulation Distance (Feet): 90 Feet Assistive device: Rolling walker Ambulation/Gait Assistance Details: Cues for gait sequence and to activate L quad for stance stability Gait Pattern: Step-to pattern    Exercises Total Joint Exercises Ankle Circles/Pumps: AROM;Both;10 reps;Seated Quad Sets: AROM;Left;10 reps Long Arc Quad: AAROM;Left;10 reps    PT Diagnosis:    PT Problem List:   PT Treatment Interventions:     PT Goals (current goals can now be found in the care plan section) Acute Rehab PT Goals Patient Stated Goal: To be able to get home. PT Goal Formulation: With patient Time For Goal Achievement: 06/10/13 Potential to Achieve Goals: Good  Visit Information  Last PT Received On: 06/04/13 Assistance Needed: +1 History of Present Illness: Patient is a 57 yo female s/p Lt. TKA.    Subjective Data  Subjective: Concerned about getting into high bed Patient Stated Goal: To be able to get home.   Cognition  Cognition Arousal/Alertness: Awake/alert Behavior During Therapy: WFL for tasks assessed/performed Overall Cognitive Status: Within Functional Limits for tasks assessed    Balance     End of Session PT - End of Session Equipment Utilized During Treatment: Gait belt Activity Tolerance: Patient tolerated treatment well Patient left: in chair;with call bell/phone within reach;with family/visitor present Nurse Communication: Mobility status   GP     [name], [place] [phone]  06/04/2013, 12:47 PM

## 2016-06-16 ENCOUNTER — Ambulatory Visit: Payer: Self-pay | Admitting: General Surgery

## 2016-06-16 NOTE — H&P (Signed)
History of Present Illness Alexis Choi(Alexis Choi A. Alexis Star MD; 06/16/2016 4:01 PM) Patient words: New Pt Gallbladder.  The patient is a 60 year old female who presents for evaluation of gall stones. Referred by Lucila Maineobert Scott. Patient had an attack 2 weeks ago with right upper quadrant pain, nausea, back and shoulder pain after eating. Last night she had similar attack after having a steak sub. She notes no change in her weight in the past year. She has tried to lose weight by dieting without much change. No previous abdominal surgeries. 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 She also notes chronic back pain.   Other Problems Alexis Choi(Alexis Choi, CMA; 06/16/2016 3:18 PM) Arthritis Back Pain Cholelithiasis Diabetes Mellitus Other disease, cancer, significant illness  Past Surgical History Alexis Choi(Alexis Choi, CMA; 06/16/2016 3:18 PM) Knee Surgery Bilateral. Tonsillectomy  Diagnostic Studies History Alexis Choi(Alexis Choi, CMA; 06/16/2016 3:18 PM) Colonoscopy 5-10 years ago Mammogram 1-3 years ago Pap Smear 1-5 years ago  Allergies Alexis Choi(Alexis Choi, CMA; 06/16/2016 3:22 PM) Latex Januvia *ANTIDIABETICS* MetFORMIN HCl *ANTIDIABETICS*  Medication History (Alexis Choi, CMA; 06/16/2016 3:24 PM) Evaristo Buryresiba FlexTouch (100UNIT/ML Soln Pen-inj, Subcutaneous) Active. Cheratussin AC (100-10MG /5ML Syrup, Oral) Active. Celecoxib (200MG  Capsule, Oral) Active. Contrave (8-90MG  Tablet ER 12HR, Oral) Active. Cyclobenzaprine HCl (5MG  Tablet, Oral) Active. Dulera (200-5MCG/ACT Aerosol, Inhalation) Active. NovoLOG (100UNIT/ML Solution, Subcutaneous) Active. OneTouch Ultra Blue (In Vitro) Active. Verapamil HCl ER (120MG  Capsule ER 24HR, Oral) Active. CeleBREX (100MG  Capsule, Oral) Active.  Social History Alexis Choi(Alexis Choi, CMA; 06/16/2016 3:18 PM) Alcohol use Occasional alcohol use. Caffeine use Carbonated beverages, Coffee, Tea. No drug use Tobacco use Former smoker.  Family History Alexis Choi(Alexis Choi, CMA;  06/16/2016 3:18 PM) Arthritis Father, Mother. Cerebrovascular Accident Brother. Colon Cancer Mother. Hypertension Daughter, Mother, Son. Respiratory Condition Mother. Seizure disorder Daughter.  Pregnancy / Birth History Alexis Choi(Alexis Choi, CMA; 06/16/2016 3:18 PM) Age at menarche 11 years. Age of menopause 2646-50 Gravida 2 Irregular periods Length (months) of breastfeeding 3-6 Maternal age 60-25 Para 2    Review of Systems Alexis Choi(Alexis Choi CMA; 06/16/2016 3:18 PM) General Present- Fatigue. Not Present- Appetite Loss, Chills, Fever, Night Sweats, Weight Gain and Weight Loss. Skin Present- Dryness. Not Present- Change in Wart/Mole, Hives, Jaundice, New Lesions, Non-Healing Wounds, Rash and Ulcer. HEENT Present- Seasonal Allergies. Not Present- Earache, Hearing Loss, Hoarseness, Nose Bleed, Oral Ulcers, Ringing in the Ears, Sinus Pain, Sore Throat, Visual Disturbances, Wears glasses/contact lenses and Yellow Eyes. Respiratory Not Present- Bloody sputum, Chronic Cough, Difficulty Breathing, Snoring and Wheezing. Breast Not Present- Breast Mass, Breast Pain, Nipple Discharge and Skin Changes. Cardiovascular Present- Leg Cramps. Not Present- Chest Pain, Difficulty Breathing Lying Down, Palpitations, Rapid Heart Rate, Shortness of Breath and Swelling of Extremities. Gastrointestinal Present- Abdominal Pain and Bloating. Not Present- Bloody Stool, Change in Bowel Habits, Chronic diarrhea, Constipation, Difficulty Swallowing, Excessive gas, Gets full quickly at meals, Hemorrhoids, Indigestion, Nausea, Rectal Pain and Vomiting. Female Genitourinary Present- Urgency. Not Present- Frequency, Nocturia, Painful Urination and Pelvic Pain. Musculoskeletal Present- Back Pain and Joint Stiffness. Not Present- Joint Pain, Muscle Pain, Muscle Weakness and Swelling of Extremities. Psychiatric Not Present- Anxiety, Bipolar, Change in Sleep Pattern, Depression, Fearful and Frequent crying. Endocrine  Present- Heat Intolerance. Not Present- Cold Intolerance, Excessive Hunger, Hair Changes, Hot flashes and New Diabetes. Hematology Not Present- Blood Thinners, Easy Bruising, Excessive bleeding, Gland problems, HIV and Persistent Infections.  Vitals (Alexis Choi CMA; 06/16/2016 3:19 PM) 06/16/2016 3:19 PM Weight: 271.4 lb Height: 62in Body Surface Area: 2.18 m Body Mass Index: 49.64 kg/m  Temp.: 97.57F(Oral)  Pulse:  116 (Regular)  P.OX: 98% (Room air) BP: 138/64 (Sitting, Left Arm, Standard)       Physical Exam Alexis Carl, MD; 06/16/2016 4:2 PM) General Mental Status-Alert. General Appearance-Cooperative. Orientation-Oriented X4. Posture-Normal posture.  Integumentary Global Assessment Normal Exam - Head/Face: no rashes, ulcers, lesions or evidence of photo damage. No palpable nodules or masses and Neck: no visible lesions or palpable masses.  Head and Neck Head-normocephalic, atraumatic with no lesions or palpable masses. Face Global Assessment - atraumatic. Thyroid Gland Characteristics - normal size and consistency.  Eye Eyeball - Bilateral-Extraocular movements intact. Sclera/Conjunctiva - Bilateral-No scleral icterus, No Discharge.  ENMT Nose and Sinuses Nose - no deformities observed, no swelling present.  Chest and Lung Exam Palpation Normal exam - Non-tender. Auscultation Breath sounds - Normal.  Cardiovascular Auscultation Rhythm - Regular. Heart Sounds - S1 WNL and S2 WNL. Carotid arteries - No Carotid bruit.  Abdomen Inspection Normal Exam - No Visible peristalsis, No Abnormal pulsations and No Paradoxical movements. Palpation/Percussion Normal exam - Soft, No Rebound tenderness, No Rigidity (guarding), No hepatosplenomegaly and No Palpable abdominal masses. Tenderness - Right Upper Quadrant. Gallbladder - Negative Murphy's sign.  Peripheral Vascular Upper Extremity Palpation - Pulses bilaterally normal. Lower  Extremity Palpation - Edema - Bilateral - No edema.  Neurologic Neurologic evaluation reveals -normal sensation and normal coordination.  Neuropsychiatric Mental status exam performed with findings of-able to articulate well with normal speech/language, rate, volume and coherence and thought content normal with ability to perform basic computations and apply abstract reasoning.  Musculoskeletal Normal Exam - Bilateral-Upper Extremity Strength Normal and Lower Extremity Strength Normal.    Assessment & Plan Alexis Carl MD; 06/16/2016 4:01 PM) GALL BLADDER STONES (K80.20) Impression: 60 year old female with morbid obesity and symptomatic gallstones. Based on her ultrasound and exam findings I think that her pain attacks are most likely related to gallstones. We discussed the etiology of gallstones and probably cause pain. We discussed exacerbating factors including fatty meals. We discussed the details of surgery for removal of the gallbladder including general anesthesia, 4 small incisions in the patient's abdomen, removal of the patient's gallbladder with the liver and common bile duct, and most likely outpatient procedure. We discussed risks of common bile duct injury, cystic duct stump leak, injury to liver, bleeding, infection, need for open procedure, and this cholecystectomy syndrome. The patient showed good understanding and wanted to proceed with laparoscopic cholecystectomy. Current Plans The anatomy & physiology of hepatobiliary & pancreatic function was discussed. The pathophysiology of gallbladder dysfunction was discussed. Natural history risks without surgery was discussed. I feel the risks of no intervention will lead to serious problems that outweigh the operative risks; therefore, I recommended cholecystectomy to remove the pathology. I explained laparoscopic techniques with possible need for an open approach. Probable cholangiogram to evaluate the bilary tract was  explained as well.  Risks such as bleeding, infection, abscess, leak, injury to other organs, need for further treatment, heart attack, death, and other risks were discussed. I noted a good likelihood this will help address the problem. Possibility that this will not correct all abdominal symptoms was explained. Goals of post-operative recovery were discussed as well. We will work to minimize complications. An educational handout further explaining the pathology and treatment options was given as well. Questions were answered. The patient expresses understanding & wishes to proceed with surgery.  Pt Education - Laparoscopic Cholecystectomy: gallbladder Pt Education - Pamphlet Given - Laparoscopic Gallbladder Surgery: discussed with patient and provided information. You are being  scheduled for surgery - Our schedulers will call you.  You should hear from our office's scheduling department within 5 working days about the location, date, and time of surgery. We try to make accommodations for patient's preferences in scheduling surgery, but sometimes the OR schedule or the surgeon's schedule prevents us from making those accommodations.  If you have not heard from our office ([phone]) in 5 working days, call the office and ask for your surgeon's nurse.  If you have other questions about your diagnosis, plan, or surgery, call the office and ask for your surgeon's nurse. 

## 2016-06-24 NOTE — Pre-Procedure Instructions (Signed)
LOV PCP 06-02-16 on chart Hgb A1C 06-02-16 on chart CBC 06-02-16 on chart

## 2016-06-24 NOTE — Patient Instructions (Addendum)
Alexis Choi  06/24/2016   Your procedure is scheduled on: 06/30/16  Report to [facility] Main  Entrance take [facility]  elevators to 3rd floor to  Short Stay Center at 5:30 AM.  Call this number if you have problems the morning of surgery [phone]   Remember: ONLY 1 PERSON MAY GO WITH YOU TO SHORT STAY TO GET  READY MORNING OF YOUR SURGERY.  Do not eat food or drink liquids :After Midnight. Half your night time insulin dose.  Have a light healthy snack before midnight the night before surgery.   Take these medicines the morning of surgery with A SIP OF WATER: Omeprazole, Verapamil, Dulera if needed, QNASL if needed DO NOT TAKE ANY DIABETIC MEDICATIONS DAY OF YOUR SURGERY, EXCEPT YOU MAY CONTINUE YOUR BASAL RATE INSULIN PUMP.  BRING INSULIN PUMP SUPPLIES WITH YOU TO HOSPITAL.                               You may not have any metal on your body including hair pins and              piercings  Do not wear jewelry, make-up, lotions, powders or perfumes, deodorant             Do not wear nail polish.  Do not shave  48 hours prior to surgery.              Men may shave face and neck.   Do not bring valuables to the hospital.  IS NOT             RESPONSIBLE   FOR VALUABLES.  Contacts, dentures or bridgework may not be worn into surgery.  Leave suitcase in the car. After surgery it may be brought to your room.     Patients discharged the day of surgery will not be allowed to drive home.  Name and phone number of your driver:  Special Instructions: N/A              Please read over the following fact sheets you were given: _____________________________________________________________________             [facility] - Preparing for Surgery Before surgery, you can play an important role.  Because skin is not sterile, your skin needs to be as free of germs as possible.  You can reduce the number of germs on your skin by washing with CHG (chlorahexidine  gluconate) soap before surgery.  CHG is an antiseptic cleaner which kills germs and bonds with the skin to continue killing germs even after washing. Please DO NOT use if you have an allergy to CHG or antibacterial soaps.  If your skin becomes reddened/irritated stop using the CHG and inform your nurse when you arrive at Short Stay. Do not shave (including legs and underarms) for at least 48 hours prior to the first CHG shower.  You may shave your face/neck. Please follow these instructions carefully:  1.  Shower with CHG Soap the night before surgery and the  morning of Surgery.  2.  If you choose to wash your hair, wash your hair first as usual with your  normal  shampoo.  3.  After you shampoo, rinse your hair and body thoroughly to remove the  shampoo.  4.  Use CHG as you would any other liquid soap.  You can apply chg directly  to the skin and wash                       Gently with a scrungie or clean washcloth.  5.  Apply the CHG Soap to your body ONLY FROM THE NECK DOWN.   Do not use on face/ open                           Wound or open sores. Avoid contact with eyes, ears mouth and genitals (private parts).                       Wash face,  Genitals (private parts) with your normal soap.             6.  Wash thoroughly, paying special attention to the area where your surgery  will be performed.  7.  Thoroughly rinse your body with warm water from the neck down.  8.  DO NOT shower/wash with your normal soap after using and rinsing off  the CHG Soap.                9.  Pat yourself dry with a clean towel.            10.  Wear clean pajamas.            11.  Place clean sheets on your bed the night of your first shower and do not  sleep with pets. Day of Surgery : Do not apply any lotions/deodorants the morning of surgery.  Please wear clean clothes to the hospital/surgery center.  FAILURE TO FOLLOW THESE INSTRUCTIONS MAY RESULT IN THE CANCELLATION OF YOUR  SURGERY PATIENT SIGNATURE_________________________________  NURSE SIGNATURE__________________________________  ________________________________________________________________________

## 2016-06-27 ENCOUNTER — Encounter (HOSPITAL_COMMUNITY): Payer: Self-pay

## 2016-06-27 ENCOUNTER — Encounter (HOSPITAL_COMMUNITY)
Admission: RE | Admit: 2016-06-27 | Discharge: 2016-06-27 | Disposition: A | Discharge status: Home / Self Care | Payer: BC Managed Care – PPO | Source: Ambulatory Visit | Attending: General Surgery | Admitting: General Surgery

## 2016-06-27 DIAGNOSIS — K8020 Calculus of gallbladder without cholecystitis without obstruction: Secondary | ICD-10-CM | POA: Diagnosis present

## 2016-06-27 DIAGNOSIS — Z794 Long term (current) use of insulin: Secondary | ICD-10-CM | POA: Diagnosis not present

## 2016-06-27 DIAGNOSIS — Z79899 Other long term (current) drug therapy: Secondary | ICD-10-CM | POA: Diagnosis not present

## 2016-06-27 DIAGNOSIS — Z6841 Body Mass Index (BMI) 40.0 and over, adult: Secondary | ICD-10-CM | POA: Diagnosis not present

## 2016-06-27 DIAGNOSIS — K219 Gastro-esophageal reflux disease without esophagitis: Secondary | ICD-10-CM | POA: Diagnosis not present

## 2016-06-27 DIAGNOSIS — E119 Type 2 diabetes mellitus without complications: Secondary | ICD-10-CM | POA: Diagnosis not present

## 2016-06-27 DIAGNOSIS — K8010 Calculus of gallbladder with chronic cholecystitis without obstruction: Secondary | ICD-10-CM | POA: Diagnosis not present

## 2016-06-27 HISTORY — DX: Pneumonia, unspecified organism: J18.9

## 2016-06-27 HISTORY — DX: Family history of other specified conditions: Z84.89

## 2016-06-27 HISTORY — DX: Dorsalgia, unspecified: M54.9

## 2016-06-27 HISTORY — DX: Other chronic pain: G89.29

## 2016-06-27 LAB — CBC
HCT: 36.3 % (ref 36.0–46.0)
HEMOGLOBIN: 11.6 g/dL — AB (ref 12.0–15.0)
MCH: 27.2 pg (ref 26.0–34.0)
MCHC: 32 g/dL (ref 30.0–36.0)
MCV: 85.2 fL (ref 78.0–100.0)
Platelets: 278 10*3/uL (ref 150–400)
RBC: 4.26 MIL/uL (ref 3.87–5.11)
RDW: 15.7 % — ABNORMAL HIGH (ref 11.5–15.5)
WBC: 10.5 10*3/uL (ref 4.0–10.5)

## 2016-06-27 LAB — BASIC METABOLIC PANEL
ANION GAP: 8 (ref 5–15)
BUN: 26 mg/dL — ABNORMAL HIGH (ref 6–20)
CALCIUM: 9 mg/dL (ref 8.9–10.3)
CO2: 26 mmol/L (ref 22–32)
Chloride: 103 mmol/L (ref 101–111)
Creatinine, Ser: 0.73 mg/dL (ref 0.44–1.00)
GFR calc non Af Amer: >60 mL/min (ref >60)
GLUCOSE: 159 mg/dL — AB (ref 65–99)
POTASSIUM: 4.2 mmol/L (ref 3.5–5.1)
Sodium: 137 mmol/L (ref 135–145)

## 2016-06-27 NOTE — Pre-Procedure Instructions (Signed)
Pt's Hgb A1C from 05/26/16 was 8.6.  Pt is scheduled for lap cholecystectomy 06/30/16.

## 2016-06-27 NOTE — Pre-Procedure Instructions (Signed)
Spoke with Dr. Hart RochesterHollis and Diabetes coordinator regarding pt's insulin pump.  Instructed pt to keep basal rate of 2 units per hour on pump, and pt will be evaluated morning of surgery.

## 2016-06-27 NOTE — Pre-Procedure Instructions (Signed)
Routed Dr. Sheliah HatchKinsinger results from pt's Hgb A1C 05/26/16 at pt's PCP visit.  Results in pt's chart.

## 2016-06-29 NOTE — Anesthesia Preprocedure Evaluation (Signed)
Anesthesia Evaluation  Patient identified by MRN, date of birth, ID band Patient awake    Reviewed: Allergy & Precautions, NPO status , Patient's Chart, lab work & pertinent test results  Airway Mallampati: III  TM Distance: <3 FB Neck ROM: Full    Dental no notable dental hx.    Pulmonary neg pulmonary ROS,    Pulmonary exam normal breath sounds clear to auscultation       Cardiovascular negative cardio ROS Normal cardiovascular exam Rhythm:Regular Rate:Normal     Neuro/Psych negative neurological ROS  negative psych ROS   GI/Hepatic Neg liver ROS, GERD  Medicated,  Endo/Other  diabetesMorbid obesity  Renal/GU negative Renal ROS  negative genitourinary   Musculoskeletal negative musculoskeletal ROS (+)   Abdominal   Peds negative pediatric ROS (+)  Hematology negative hematology ROS (+)   Anesthesia Other Findings   Reproductive/Obstetrics negative OB ROS                             Anesthesia Physical Anesthesia Plan  ASA: III  Anesthesia Plan: General   Post-op Pain Management:    Induction: Intravenous  Airway Management Planned: Oral ETT  Additional Equipment:   Intra-op Plan:   Post-operative Plan: Extubation in OR  Informed Consent: I have reviewed the patients History and Physical, chart, labs and discussed the procedure including the risks, benefits and alternatives for the proposed anesthesia with the patient or authorized representative who has indicated his/her understanding and acceptance.   Dental advisory given  Plan Discussed with: CRNA and Surgeon  Anesthesia Plan Comments:         Anesthesia Quick Evaluation

## 2016-06-30 ENCOUNTER — Ambulatory Visit (HOSPITAL_COMMUNITY): Payer: BC Managed Care – PPO | Admitting: Anesthesiology

## 2016-06-30 ENCOUNTER — Encounter (HOSPITAL_COMMUNITY): Payer: Self-pay | Admitting: *Deleted

## 2016-06-30 ENCOUNTER — Ambulatory Visit (HOSPITAL_COMMUNITY)
Admission: RE | Admit: 2016-06-30 | Discharge: 2016-06-30 | Disposition: A | Discharge status: Home / Self Care | Payer: BC Managed Care – PPO | Source: Ambulatory Visit | Attending: General Surgery | Admitting: General Surgery

## 2016-06-30 ENCOUNTER — Encounter (HOSPITAL_COMMUNITY)
Admission: RE | Disposition: A | Discharge status: Home / Self Care | Payer: Self-pay | Source: Ambulatory Visit | Attending: General Surgery

## 2016-06-30 DIAGNOSIS — Z79899 Other long term (current) drug therapy: Secondary | ICD-10-CM | POA: Insufficient documentation

## 2016-06-30 DIAGNOSIS — Z6841 Body Mass Index (BMI) 40.0 and over, adult: Secondary | ICD-10-CM | POA: Insufficient documentation

## 2016-06-30 DIAGNOSIS — K219 Gastro-esophageal reflux disease without esophagitis: Secondary | ICD-10-CM | POA: Insufficient documentation

## 2016-06-30 DIAGNOSIS — E119 Type 2 diabetes mellitus without complications: Secondary | ICD-10-CM | POA: Insufficient documentation

## 2016-06-30 DIAGNOSIS — K8010 Calculus of gallbladder with chronic cholecystitis without obstruction: Secondary | ICD-10-CM | POA: Insufficient documentation

## 2016-06-30 DIAGNOSIS — Z794 Long term (current) use of insulin: Secondary | ICD-10-CM | POA: Insufficient documentation

## 2016-06-30 HISTORY — PX: CHOLECYSTECTOMY: SHX55

## 2016-06-30 LAB — GLUCOSE, CAPILLARY
Glucose-Capillary: 136 mg/dL — ABNORMAL HIGH (ref 65–99)
Glucose-Capillary: 203 mg/dL — ABNORMAL HIGH (ref 65–99)

## 2016-06-30 SURGERY — LAPAROSCOPIC CHOLECYSTECTOMY
Anesthesia: General | Site: Abdomen

## 2016-06-30 MED ORDER — METOCLOPRAMIDE HCL 5 MG/ML IJ SOLN
INTRAMUSCULAR | Status: DC | PRN
  Administered 2016-06-30: 10 mg via INTRAVENOUS

## 2016-06-30 MED ORDER — BUPIVACAINE-EPINEPHRINE 0.25% -1:200000 IJ SOLN
INTRAMUSCULAR | Status: DC | PRN
  Administered 2016-06-30: 50 mL

## 2016-06-30 MED ORDER — METOCLOPRAMIDE HCL 5 MG/ML IJ SOLN
INTRAMUSCULAR | Status: AC
  Filled 2016-06-30: qty 2

## 2016-06-30 MED ORDER — HYDROCODONE-ACETAMINOPHEN 5-325 MG PO TABS: 1.0000 | ORAL_TABLET | Freq: Four times a day (QID) | ORAL | 0 refills | Status: DC | PRN

## 2016-06-30 MED ORDER — CEFOTETAN DISODIUM-DEXTROSE 2-2.08 GM-% IV SOLR
2.0000 g | INTRAVENOUS | Status: AC
  Administered 2016-06-30: 2 g via INTRAVENOUS

## 2016-06-30 MED ORDER — IOPAMIDOL (ISOVUE-300) INJECTION 61%
INTRAVENOUS | Status: AC
  Filled 2016-06-30: qty 50

## 2016-06-30 MED ORDER — FENTANYL CITRATE (PF) 100 MCG/2ML IJ SOLN
INTRAMUSCULAR | Status: DC | PRN
  Administered 2016-06-30 (×4): 50 ug via INTRAVENOUS

## 2016-06-30 MED ORDER — SUGAMMADEX SODIUM 200 MG/2ML IV SOLN
INTRAVENOUS | Status: DC | PRN
  Administered 2016-06-30 (×2): 250 mg via INTRAVENOUS

## 2016-06-30 MED ORDER — MEPERIDINE HCL 50 MG/ML IJ SOLN
INTRAMUSCULAR | Status: AC
  Filled 2016-06-30: qty 1

## 2016-06-30 MED ORDER — HYDROMORPHONE HCL 1 MG/ML IJ SOLN
INTRAMUSCULAR | Status: AC
  Filled 2016-06-30: qty 1

## 2016-06-30 MED ORDER — SCOPOLAMINE 1 MG/3DAYS TD PT72
MEDICATED_PATCH | TRANSDERMAL | Status: AC
  Filled 2016-06-30: qty 1

## 2016-06-30 MED ORDER — ACETAMINOPHEN 500 MG PO TABS
1000.0000 mg | ORAL_TABLET | ORAL | Status: AC
  Administered 2016-06-30: 1000 mg via ORAL
  Filled 2016-06-30: qty 2

## 2016-06-30 MED ORDER — PROMETHAZINE HCL 25 MG/ML IJ SOLN: 6.2500 mg | INTRAMUSCULAR | Status: DC | PRN

## 2016-06-30 MED ORDER — HEPARIN SODIUM (PORCINE) 5000 UNIT/ML IJ SOLN
5000.0000 [IU] | Freq: Once | INTRAMUSCULAR | Status: AC
  Administered 2016-06-30: 5000 [IU] via SUBCUTANEOUS
  Filled 2016-06-30: qty 1

## 2016-06-30 MED ORDER — ROCURONIUM BROMIDE 100 MG/10ML IV SOLN
INTRAVENOUS | Status: DC | PRN
  Administered 2016-06-30: 50 mg via INTRAVENOUS

## 2016-06-30 MED ORDER — LIDOCAINE HCL (CARDIAC) 20 MG/ML IV SOLN
INTRAVENOUS | Status: DC | PRN
  Administered 2016-06-30: 50 mg via INTRAVENOUS

## 2016-06-30 MED ORDER — CELECOXIB 200 MG PO CAPS
400.0000 mg | ORAL_CAPSULE | ORAL | Status: AC
  Administered 2016-06-30: 400 mg via ORAL
  Filled 2016-06-30: qty 2

## 2016-06-30 MED ORDER — SCOPOLAMINE 1 MG/3DAYS TD PT72
MEDICATED_PATCH | TRANSDERMAL | Status: DC | PRN
  Administered 2016-06-30: 1 via TRANSDERMAL

## 2016-06-30 MED ORDER — PROPOFOL 10 MG/ML IV BOLUS
INTRAVENOUS | Status: DC | PRN
  Administered 2016-06-30: 170 mg via INTRAVENOUS

## 2016-06-30 MED ORDER — ONDANSETRON HCL 4 MG/2ML IJ SOLN
INTRAMUSCULAR | Status: AC
  Filled 2016-06-30: qty 2

## 2016-06-30 MED ORDER — MIDAZOLAM HCL 2 MG/2ML IJ SOLN
INTRAMUSCULAR | Status: AC
  Filled 2016-06-30: qty 2

## 2016-06-30 MED ORDER — FENTANYL CITRATE (PF) 250 MCG/5ML IJ SOLN
INTRAMUSCULAR | Status: AC
  Filled 2016-06-30: qty 5

## 2016-06-30 MED ORDER — MIDAZOLAM HCL 5 MG/5ML IJ SOLN
INTRAMUSCULAR | Status: DC | PRN
  Administered 2016-06-30 (×2): 1 mg via INTRAVENOUS

## 2016-06-30 MED ORDER — GABAPENTIN 300 MG PO CAPS
300.0000 mg | ORAL_CAPSULE | ORAL | Status: AC
  Administered 2016-06-30: 300 mg via ORAL
  Filled 2016-06-30: qty 1

## 2016-06-30 MED ORDER — CEFOTETAN DISODIUM-DEXTROSE 2-2.08 GM-% IV SOLR
INTRAVENOUS | Status: AC
  Filled 2016-06-30: qty 50

## 2016-06-30 MED ORDER — DEXAMETHASONE SODIUM PHOSPHATE 4 MG/ML IJ SOLN
INTRAMUSCULAR | Status: DC | PRN
  Administered 2016-06-30: 4 mg via INTRAVENOUS

## 2016-06-30 MED ORDER — BUPIVACAINE-EPINEPHRINE 0.25% -1:200000 IJ SOLN
INTRAMUSCULAR | Status: AC
  Filled 2016-06-30: qty 1

## 2016-06-30 MED ORDER — HYDROMORPHONE HCL 1 MG/ML IJ SOLN
0.2500 mg | INTRAMUSCULAR | Status: DC | PRN
  Administered 2016-06-30: 0.5 mg via INTRAVENOUS

## 2016-06-30 MED ORDER — ROCURONIUM BROMIDE 100 MG/10ML IV SOLN
INTRAVENOUS | Status: AC
  Filled 2016-06-30: qty 1

## 2016-06-30 MED ORDER — 0.9 % SODIUM CHLORIDE (POUR BTL) OPTIME
TOPICAL | Status: DC | PRN
  Administered 2016-06-30: 1000 mL

## 2016-06-30 MED ORDER — DEXAMETHASONE SODIUM PHOSPHATE 10 MG/ML IJ SOLN
INTRAMUSCULAR | Status: AC
  Filled 2016-06-30: qty 1

## 2016-06-30 MED ORDER — LACTATED RINGERS IV SOLN
INTRAVENOUS | Status: DC | PRN
  Administered 2016-06-30 (×2): via INTRAVENOUS

## 2016-06-30 MED ORDER — KETOROLAC TROMETHAMINE 30 MG/ML IJ SOLN
INTRAMUSCULAR | Status: DC | PRN
  Administered 2016-06-30: 30 mg via INTRAVENOUS

## 2016-06-30 MED ORDER — SUGAMMADEX SODIUM 500 MG/5ML IV SOLN
INTRAVENOUS | Status: AC
  Filled 2016-06-30: qty 5

## 2016-06-30 MED ORDER — KETOROLAC TROMETHAMINE 30 MG/ML IJ SOLN
INTRAMUSCULAR | Status: AC
  Filled 2016-06-30: qty 1

## 2016-06-30 MED ORDER — LIDOCAINE HCL (CARDIAC) 20 MG/ML IV SOLN
INTRAVENOUS | Status: AC
  Filled 2016-06-30: qty 5

## 2016-06-30 MED ORDER — MEPERIDINE HCL 25 MG/ML IJ SOLN
INTRAMUSCULAR | Status: DC | PRN
  Administered 2016-06-30: 12.5 mg via INTRAVENOUS

## 2016-06-30 MED ORDER — PROPOFOL 10 MG/ML IV BOLUS
INTRAVENOUS | Status: AC
  Filled 2016-06-30: qty 20

## 2016-06-30 MED ORDER — ONDANSETRON HCL 4 MG/2ML IJ SOLN
INTRAMUSCULAR | Status: DC | PRN
  Administered 2016-06-30: 4 mg via INTRAVENOUS

## 2016-06-30 MED ORDER — LACTATED RINGERS IR SOLN
Status: DC | PRN
  Administered 2016-06-30: 1000 mL

## 2016-06-30 MED ORDER — KETOROLAC TROMETHAMINE 30 MG/ML IJ SOLN: 30.0000 mg | Freq: Once | INTRAMUSCULAR | Status: DC | PRN

## 2016-06-30 SURGICAL SUPPLY — 42 items
APPLIER CLIP ROT 10 11.4 M/L (STAPLE)
APR CLP MED LRG 11.4X10 (STAPLE)
BAG SPEC RTRVL 10 TROC 200 (ENDOMECHANICALS) ×1
CABLE HIGH FREQUENCY MONO STRZ (ELECTRODE) ×3 IMPLANT
CATH CHOLANG 76X19 KUMAR (CATHETERS) IMPLANT
CHLORAPREP W/TINT 26ML (MISCELLANEOUS) ×5 IMPLANT
CLIP APPLIE ROT 10 11.4 M/L (STAPLE) IMPLANT
CLIP LIGATING HEM O LOK PURPLE (MISCELLANEOUS) ×6 IMPLANT
CLIP LIGATING HEMO LOK XL GOLD (MISCELLANEOUS) IMPLANT
COVER MAYO STAND STRL (DRAPES) IMPLANT
COVER SURGICAL LIGHT HANDLE (MISCELLANEOUS) ×3 IMPLANT
DECANTER SPIKE VIAL GLASS SM (MISCELLANEOUS) ×3 IMPLANT
DRAIN CHANNEL 19F RND (DRAIN) IMPLANT
DRAPE C-ARM 42X120 X-RAY (DRAPES) IMPLANT
DRAPE LAPAROSCOPIC ABDOMINAL (DRAPES) IMPLANT
EVACUATOR SILICONE 100CC (DRAIN) IMPLANT
GLOVE BIOGEL PI IND STRL 7.0 (GLOVE) ×1 IMPLANT
GLOVE BIOGEL PI INDICATOR 7.0 (GLOVE) ×2
GLOVE SURG SS PI 7.0 STRL IVOR (GLOVE) ×3 IMPLANT
GOWN STRL REUS W/TWL LRG LVL3 (GOWN DISPOSABLE) ×3 IMPLANT
GOWN STRL REUS W/TWL XL LVL3 (GOWN DISPOSABLE) ×6 IMPLANT
GRASPER SUT TROCAR 14GX15 (MISCELLANEOUS) ×3 IMPLANT
HOVERMATT SINGLE USE (MISCELLANEOUS) ×2 IMPLANT
IRRIG SUCT STRYKERFLOW 2 WTIP (MISCELLANEOUS) ×3
IRRIGATION SUCT STRKRFLW 2 WTP (MISCELLANEOUS) ×1 IMPLANT
KIT BASIN OR (CUSTOM PROCEDURE TRAY) ×3 IMPLANT
LIQUID BAND (GAUZE/BANDAGES/DRESSINGS) ×3 IMPLANT
POUCH RETRIEVAL ECOSAC 10 (ENDOMECHANICALS) ×1 IMPLANT
POUCH RETRIEVAL ECOSAC 10MM (ENDOMECHANICALS) ×2
SCISSORS LAP 5X35 DISP (ENDOMECHANICALS) ×3 IMPLANT
SHEARS HARMONIC ACE PLUS 36CM (ENDOMECHANICALS) IMPLANT
SLEEVE XCEL OPT CAN 5 100 (ENDOMECHANICALS) ×6 IMPLANT
STOPCOCK 4 WAY LG BORE MALE ST (IV SETS) IMPLANT
SUT ETHILON 2 0 PS N (SUTURE) IMPLANT
SUT MNCRL AB 4-0 PS2 18 (SUTURE) ×3 IMPLANT
SUT VICRYL 0 ENDOLOOP (SUTURE) IMPLANT
TOWEL OR 17X26 10 PK STRL BLUE (TOWEL DISPOSABLE) ×3 IMPLANT
TOWEL OR NON WOVEN STRL DISP B (DISPOSABLE) ×1 IMPLANT
TRAY LAPAROSCOPIC (CUSTOM PROCEDURE TRAY) ×3 IMPLANT
TROCAR BLADELESS OPT 5 100 (ENDOMECHANICALS) ×3 IMPLANT
TROCAR XCEL 12X100 BLDLESS (ENDOMECHANICALS) ×3 IMPLANT
TUBING INSUF HEATED (TUBING) ×3 IMPLANT

## 2016-06-30 NOTE — H&P (View-Only) (Signed)
History of Present Illness Alexis Choi(Bertel Venard A. Sanaia Jasso MD; 06/16/2016 4:01 PM) Patient words: New Pt Gallbladder.  The patient is a 60 year old female who presents for evaluation of gall stones. Referred by Lucila Maineobert Scott. Patient had an attack 2 weeks ago with right upper quadrant pain, nausea, back and shoulder pain after eating. Last night she had similar attack after having a steak sub. She notes no change in her weight in the past year. She has tried to lose weight by dieting without much change. No previous abdominal surgeries. 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 She also notes chronic back pain.   Other Problems [name](Chemira Jones, CMA; 06/16/2016 3:18 PM) Arthritis Back Pain Cholelithiasis Diabetes Mellitus Other disease, cancer, significant illness  Past Surgical History [name](Chemira Jones, CMA; 06/16/2016 3:18 PM) Knee Surgery Bilateral. Tonsillectomy  Diagnostic Studies History [name](Chemira Jones, CMA; 06/16/2016 3:18 PM) Colonoscopy 5-10 years ago Mammogram 1-3 years ago Pap Smear 1-5 years ago  Allergies [name](Chemira Jones, CMA; 06/16/2016 3:22 PM) Latex Januvia *ANTIDIABETICS* MetFORMIN HCl *ANTIDIABETICS*  Medication History (Chemira Jones, CMA; 06/16/2016 3:24 PM) Evaristo Buryresiba FlexTouch (100UNIT/ML Soln Pen-inj, Subcutaneous) Active. Cheratussin AC (100-10MG /5ML Syrup, Oral) Active. Celecoxib (200MG  Capsule, Oral) Active. Contrave (8-90MG  Tablet ER 12HR, Oral) Active. Cyclobenzaprine HCl (5MG  Tablet, Oral) Active. Dulera (200-5MCG/ACT Aerosol, Inhalation) Active. NovoLOG (100UNIT/ML Solution, Subcutaneous) Active. OneTouch Ultra Blue (In Vitro) Active. Verapamil HCl ER (120MG  Capsule ER 24HR, Oral) Active. CeleBREX (100MG  Capsule, Oral) Active.  Social History [name](Chemira Jones, CMA; 06/16/2016 3:18 PM) Alcohol use Occasional alcohol use. Caffeine use Carbonated beverages, Coffee, Tea. No drug use Tobacco use Former smoker.  Family History [name](Chemira Jones, CMA;  06/16/2016 3:18 PM) Arthritis Father, Mother. Cerebrovascular Accident Brother. Colon Cancer Mother. Hypertension Daughter, Mother, Son. Respiratory Condition Mother. Seizure disorder Daughter.  Pregnancy / Birth History [name](Chemira Jones, CMA; 06/16/2016 3:18 PM) Age at menarche 11 years. Age of menopause 2646-50 Gravida 2 Irregular periods Length (months) of breastfeeding 3-6 Maternal age 60-25 Para 2    Review of Systems [name](Chemira Jones CMA; 06/16/2016 3:18 PM) General Present- Fatigue. Not Present- Appetite Loss, Chills, Fever, Night Sweats, Weight Gain and Weight Loss. Skin Present- Dryness. Not Present- Change in Wart/Mole, Hives, Jaundice, New Lesions, Non-Healing Wounds, Rash and Ulcer. HEENT Present- Seasonal Allergies. Not Present- Earache, Hearing Loss, Hoarseness, Nose Bleed, Oral Ulcers, Ringing in the Ears, Sinus Pain, Sore Throat, Visual Disturbances, Wears glasses/contact lenses and Yellow Eyes. Respiratory Not Present- Bloody sputum, Chronic Cough, Difficulty Breathing, Snoring and Wheezing. Breast Not Present- Breast Mass, Breast Pain, Nipple Discharge and Skin Changes. Cardiovascular Present- Leg Cramps. Not Present- Chest Pain, Difficulty Breathing Lying Down, Palpitations, Rapid Heart Rate, Shortness of Breath and Swelling of Extremities. Gastrointestinal Present- Abdominal Pain and Bloating. Not Present- Bloody Stool, Change in Bowel Habits, Chronic diarrhea, Constipation, Difficulty Swallowing, Excessive gas, Gets full quickly at meals, Hemorrhoids, Indigestion, Nausea, Rectal Pain and Vomiting. Female Genitourinary Present- Urgency. Not Present- Frequency, Nocturia, Painful Urination and Pelvic Pain. Musculoskeletal Present- Back Pain and Joint Stiffness. Not Present- Joint Pain, Muscle Pain, Muscle Weakness and Swelling of Extremities. Psychiatric Not Present- Anxiety, Bipolar, Change in Sleep Pattern, Depression, Fearful and Frequent crying. Endocrine  Present- Heat Intolerance. Not Present- Cold Intolerance, Excessive Hunger, Hair Changes, Hot flashes and New Diabetes. Hematology Not Present- Blood Thinners, Easy Bruising, Excessive bleeding, Gland problems, HIV and Persistent Infections.  Vitals (Chemira Jones CMA; 06/16/2016 3:19 PM) 06/16/2016 3:19 PM Weight: 271.4 lb Height: 62in Body Surface Area: 2.18 m Body Mass Index: 49.64 kg/m  Temp.: 97.57F(Oral)  Pulse:  116 (Regular)  P.OX: 98% (Room air) BP: 138/64 (Sitting, Left Arm, Standard)       Physical Exam Alexis Carl, MD; 06/16/2016 4:2 PM) General Mental Status-Alert. General Appearance-Cooperative. Orientation-Oriented X4. Posture-Normal posture.  Integumentary Global Assessment Normal Exam - Head/Face: no rashes, ulcers, lesions or evidence of photo damage. No palpable nodules or masses and Neck: no visible lesions or palpable masses.  Head and Neck Head-normocephalic, atraumatic with no lesions or palpable masses. Face Global Assessment - atraumatic. Thyroid Gland Characteristics - normal size and consistency.  Eye Eyeball - Bilateral-Extraocular movements intact. Sclera/Conjunctiva - Bilateral-No scleral icterus, No Discharge.  ENMT Nose and Sinuses Nose - no deformities observed, no swelling present.  Chest and Lung Exam Palpation Normal exam - Non-tender. Auscultation Breath sounds - Normal.  Cardiovascular Auscultation Rhythm - Regular. Heart Sounds - S1 WNL and S2 WNL. Carotid arteries - No Carotid bruit.  Abdomen Inspection Normal Exam - No Visible peristalsis, No Abnormal pulsations and No Paradoxical movements. Palpation/Percussion Normal exam - Soft, No Rebound tenderness, No Rigidity (guarding), No hepatosplenomegaly and No Palpable abdominal masses. Tenderness - Right Upper Quadrant. Gallbladder - Negative Murphy's sign.  Peripheral Vascular Upper Extremity Palpation - Pulses bilaterally normal. Lower  Extremity Palpation - Edema - Bilateral - No edema.  Neurologic Neurologic evaluation reveals -normal sensation and normal coordination.  Neuropsychiatric Mental status exam performed with findings of-able to articulate well with normal speech/language, rate, volume and coherence and thought content normal with ability to perform basic computations and apply abstract reasoning.  Musculoskeletal Normal Exam - Bilateral-Upper Extremity Strength Normal and Lower Extremity Strength Normal.    Assessment & Plan Alexis Carl MD; 06/16/2016 4:01 PM) GALL BLADDER STONES (K80.20) Impression: 60 year old female with morbid obesity and symptomatic gallstones. Based on her ultrasound and exam findings I think that her pain attacks are most likely related to gallstones. We discussed the etiology of gallstones and probably cause pain. We discussed exacerbating factors including fatty meals. We discussed the details of surgery for removal of the gallbladder including general anesthesia, 4 small incisions in the patient's abdomen, removal of the patient's gallbladder with the liver and common bile duct, and most likely outpatient procedure. We discussed risks of common bile duct injury, cystic duct stump leak, injury to liver, bleeding, infection, need for open procedure, and this cholecystectomy syndrome. The patient showed good understanding and wanted to proceed with laparoscopic cholecystectomy. Current Plans The anatomy & physiology of hepatobiliary & pancreatic function was discussed. The pathophysiology of gallbladder dysfunction was discussed. Natural history risks without surgery was discussed. I feel the risks of no intervention will lead to serious problems that outweigh the operative risks; therefore, I recommended cholecystectomy to remove the pathology. I explained laparoscopic techniques with possible need for an open approach. Probable cholangiogram to evaluate the bilary tract was  explained as well.  Risks such as bleeding, infection, abscess, leak, injury to other organs, need for further treatment, heart attack, death, and other risks were discussed. I noted a good likelihood this will help address the problem. Possibility that this will not correct all abdominal symptoms was explained. Goals of post-operative recovery were discussed as well. We will work to minimize complications. An educational handout further explaining the pathology and treatment options was given as well. Questions were answered. The patient expresses understanding & wishes to proceed with surgery.  Pt Education - Laparoscopic Cholecystectomy: gallbladder Pt Education - Pamphlet Given - Laparoscopic Gallbladder Surgery: discussed with patient and provided information. You are being  scheduled for surgery - Our schedulers will call you.  You should hear from our office's scheduling department within 5 working days about the location, date, and time of surgery. We try to make accommodations for patient's preferences in scheduling surgery, but sometimes the OR schedule or the surgeon's schedule prevents [place] from making those accommodations.  If you have not heard from our office [phone]) in 5 working days, call the office and ask for your surgeon's nurse.  If you have other questions about your diagnosis, plan, or surgery, call the office and ask for your surgeon's nurse.

## 2016-06-30 NOTE — Op Note (Signed)
PATIENT:  Alexis Choi  60 y.o. female  PRE-OPERATIVE DIAGNOSIS:  Symptomatic gallstones  POST-OPERATIVE DIAGNOSIS:  Symptomatic gallstones  PROCEDURE:  Procedure(s): LAPAROSCOPIC CHOLECYSTECTOMY   SURGEON:  Surgeon(s): De BlanchLuke Aaron Salathiel Ferrara, MD  ASSISTANT: noen  ANESTHESIA:   local and general  Indications for procedure: Alexis Choi is a 60 y.o. female with symptoms of Abdominal pain and RUQ pain consistent with gallbladder disease, Confirmed by Ultrasound.  Description of procedure: The patient was brought into the operative suite, placed supine. Anesthesia was administered with endotracheal tube. Patient was strapped in place and foot board was secured. All pressure points were offloaded by foam padding. The patient was prepped and draped in the usual sterile fashion.  A small incision was made to the right of the umbilicus. A 5mm trocar was inserted into the peritoneal cavity with optical entry. Pneumoperitoneum was applied with high flow low pressure. 2 5mm trocars were placed in the RUQ. A 12mm trocar was placed in the subxiphoid space. All trocars sites were first anesthesized with 0.25% marcaine with epinephrine in the subcutaneous and preperitoneal layers. Next the patient was placed in reverse trendelenberg. No adhesions to the gallbladder fundus were seen.  The gallbladder was retracted cephalad and lateral. The peritoneum was reflected off the infundibulum working lateral to medial. The cystic duct and cystic artery were identified and further dissection revealed a critical view. The cystic duct and cystic artery were doubly clipped and ligated.   The gallbladder was removed off the liver bed with cautery. The Gallbladder was placed in a specimen bag. The gallbladder fossa was irrigated and hemostasis was applied with cautery. The gallbladder was removed via the 12mm trocar. No dilation was required for removal, therefore no fascial closure was performed.  Pneumoperitoneum was removed, all trocar were removed. All incisions were closed with 4-0 monocryl subcuticular stitch. The patient woke from anesthesia and was brought to PACU in stable condition. All counts were correct  Findings: normal critical view  Specimen: gallbladder  Blood loss: No intake/output data recorded. ml  Local anesthesia: 30 ml 0.25% marcaine with epinephrine  Complications: none  PLAN OF CARE: Discharge to home after PACU  PATIENT DISPOSITION:  PACU - hemodynamically stable.  Feliciana RossettiLuke Vedh Ptacek, M.D. General, Bariatric, & Minimally Invasive Surgery [facility]

## 2016-06-30 NOTE — Interval H&P Note (Signed)
History and Physical Interval Note:  06/30/2016 7:15 AM  Alexis Choi  has presented today for surgery, with the diagnosis of Symptomatic gallstones  The various methods of treatment have been discussed with the patient and family. After consideration of risks, benefits and other options for treatment, the patient has consented to  Procedure(s): LAPAROSCOPIC CHOLECYSTECTOMY (N/A) as a surgical intervention .  The patient's history has been reviewed, patient examined, no change in status, stable for surgery.  I have reviewed the patient's chart and labs.  Questions were answered to the patient's satisfaction.     De BlanchLuke Aaron Kinsinger

## 2016-06-30 NOTE — Discharge Instructions (Signed)

## 2016-06-30 NOTE — Anesthesia Postprocedure Evaluation (Signed)
Anesthesia Post Note  Patient: Alexis Choi  Procedure(s) Performed: Procedure(s) (LRB): LAPAROSCOPIC CHOLECYSTECTOMY (N/A)  Patient location during evaluation: PACU Anesthesia Type: General Level of consciousness: awake and alert Pain management: pain level controlled Vital Signs Assessment: post-procedure vital signs reviewed and stable Respiratory status: spontaneous breathing, nonlabored ventilation, respiratory function stable and patient connected to nasal cannula oxygen Cardiovascular status: blood pressure returned to baseline and stable Postop Assessment: no signs of nausea or vomiting Anesthetic complications: no    Last Vitals:  Vitals:   06/30/16 0900 06/30/16 0908  BP: (!) 166/62   Pulse:    Resp: 15 15  Temp:      Last Pain:  Vitals:   06/30/16 0900  TempSrc:   PainSc: 3                  Chesni Vos S

## 2016-06-30 NOTE — Anesthesia Procedure Notes (Signed)
Procedure Name: Intubation Date/Time: 06/30/2016 7:28 AM Performed by: Ludwig LeanJONES, Meeghan Skipper C Pre-anesthesia Checklist: Patient identified, Emergency Drugs available, Suction available and Patient being monitored Patient Re-evaluated:Patient Re-evaluated prior to inductionOxygen Delivery Method: Circle system utilized Preoxygenation: Pre-oxygenation with 100% oxygen Intubation Type: IV induction Ventilation: Mask ventilation without difficulty Laryngoscope Size: Mac and 3 Grade View: Grade II Tube type: Oral Tube size: 7.0 mm Number of attempts: 1 Airway Equipment and Method: Stylet and Oral airway Placement Confirmation: ETT inserted through vocal cords under direct vision,  positive ETCO2 and breath sounds checked- equal and bilateral Secured at: 21 cm Tube secured with: Tape Dental Injury: Teeth and Oropharynx as per pre-operative assessment

## 2016-06-30 NOTE — Transfer of Care (Signed)
Immediate Anesthesia Transfer of Care Note  Patient: Alexis Choi  Procedure(s) Performed: Procedure(s): LAPAROSCOPIC CHOLECYSTECTOMY (N/A)  Patient Location: PACU  Anesthesia Type:General  Level of Consciousness: Patient easily awoken, sedated, comfortable, cooperative, following commands, responds to stimulation.   Airway & Oxygen Therapy: Patient spontaneously breathing, ventilating well, oxygen via simple oxygen mask.  Post-op Assessment: Report given to PACU RN, vital signs reviewed and stable, moving all extremities.   Post vital signs: Reviewed and stable.  Complications: No apparent anesthesia complications

## 2016-07-01 ENCOUNTER — Encounter (HOSPITAL_COMMUNITY): Payer: Self-pay | Admitting: General Surgery

## 2019-01-09 ENCOUNTER — Other Ambulatory Visit: Payer: Self-pay | Admitting: Neurosurgery

## 2019-02-07 ENCOUNTER — Inpatient Hospital Stay (HOSPITAL_COMMUNITY): Admission: RE | Admit: 2019-02-07 | Payer: BC Managed Care – PPO | Source: Ambulatory Visit

## 2019-05-21 ENCOUNTER — Other Ambulatory Visit: Payer: Self-pay | Admitting: Neurosurgery

## 2019-05-30 ENCOUNTER — Inpatient Hospital Stay (HOSPITAL_COMMUNITY): Admission: RE | Admit: 2019-05-30 | Payer: Self-pay | Source: Ambulatory Visit

## 2019-05-30 ENCOUNTER — Other Ambulatory Visit (HOSPITAL_COMMUNITY): Payer: Self-pay

## 2019-06-03 ENCOUNTER — Encounter (HOSPITAL_COMMUNITY): Admission: RE | Payer: Self-pay | Source: Home / Self Care

## 2019-06-03 ENCOUNTER — Inpatient Hospital Stay (HOSPITAL_COMMUNITY): Admission: RE | Admit: 2019-06-03 | Payer: BC Managed Care – PPO | Source: Home / Self Care | Admitting: Neurosurgery

## 2019-06-03 SURGERY — POSTERIOR LUMBAR FUSION 1 LEVEL
Anesthesia: General

## 2020-01-17 ENCOUNTER — Other Ambulatory Visit: Payer: Self-pay | Admitting: Neurosurgery

## 2020-01-23 ENCOUNTER — Other Ambulatory Visit: Payer: Self-pay | Admitting: Neurosurgery

## 2020-02-10 ENCOUNTER — Other Ambulatory Visit: Payer: Self-pay

## 2020-02-10 ENCOUNTER — Other Ambulatory Visit (HOSPITAL_COMMUNITY)
Admission: RE | Admit: 2020-02-10 | Discharge: 2020-02-10 | Disposition: A | Discharge status: Home / Self Care | Payer: BC Managed Care – PPO | Source: Ambulatory Visit | Attending: Neurosurgery | Admitting: Neurosurgery

## 2020-02-10 ENCOUNTER — Encounter (HOSPITAL_COMMUNITY)
Admission: RE | Admit: 2020-02-10 | Discharge: 2020-02-10 | Disposition: A | Discharge status: Home / Self Care | Payer: BC Managed Care – PPO | Source: Ambulatory Visit | Attending: Neurosurgery | Admitting: Neurosurgery

## 2020-02-10 ENCOUNTER — Encounter (HOSPITAL_COMMUNITY): Payer: Self-pay

## 2020-02-10 DIAGNOSIS — Z79899 Other long term (current) drug therapy: Secondary | ICD-10-CM | POA: Diagnosis not present

## 2020-02-10 DIAGNOSIS — Z01812 Encounter for preprocedural laboratory examination: Secondary | ICD-10-CM | POA: Diagnosis present

## 2020-02-10 DIAGNOSIS — M4316 Spondylolisthesis, lumbar region: Secondary | ICD-10-CM | POA: Insufficient documentation

## 2020-02-10 DIAGNOSIS — K589 Irritable bowel syndrome without diarrhea: Secondary | ICD-10-CM | POA: Diagnosis not present

## 2020-02-10 DIAGNOSIS — Z794 Long term (current) use of insulin: Secondary | ICD-10-CM | POA: Diagnosis not present

## 2020-02-10 DIAGNOSIS — I10 Essential (primary) hypertension: Secondary | ICD-10-CM | POA: Diagnosis not present

## 2020-02-10 DIAGNOSIS — Z7982 Long term (current) use of aspirin: Secondary | ICD-10-CM | POA: Insufficient documentation

## 2020-02-10 DIAGNOSIS — Z791 Long term (current) use of non-steroidal anti-inflammatories (NSAID): Secondary | ICD-10-CM | POA: Insufficient documentation

## 2020-02-10 DIAGNOSIS — M48061 Spinal stenosis, lumbar region without neurogenic claudication: Secondary | ICD-10-CM | POA: Insufficient documentation

## 2020-02-10 DIAGNOSIS — K219 Gastro-esophageal reflux disease without esophagitis: Secondary | ICD-10-CM | POA: Diagnosis not present

## 2020-02-10 DIAGNOSIS — E119 Type 2 diabetes mellitus without complications: Secondary | ICD-10-CM | POA: Insufficient documentation

## 2020-02-10 DIAGNOSIS — Z20822 Contact with and (suspected) exposure to covid-19: Secondary | ICD-10-CM | POA: Insufficient documentation

## 2020-02-10 DIAGNOSIS — E785 Hyperlipidemia, unspecified: Secondary | ICD-10-CM | POA: Insufficient documentation

## 2020-02-10 HISTORY — DX: Essential (primary) hypertension: I10

## 2020-02-10 HISTORY — DX: Irritable bowel syndrome, unspecified: K58.9

## 2020-02-10 LAB — CBC
HCT: 38.8 % (ref 36.0–46.0)
Hemoglobin: 12.5 g/dL (ref 12.0–15.0)
MCH: 29.2 pg (ref 26.0–34.0)
MCHC: 32.2 g/dL (ref 30.0–36.0)
MCV: 90.7 fL (ref 80.0–100.0)
Platelets: 305 10*3/uL (ref 150–400)
RBC: 4.28 MIL/uL (ref 3.87–5.11)
RDW: 13.9 % (ref 11.5–15.5)
WBC: 14.8 10*3/uL — ABNORMAL HIGH (ref 4.0–10.5)
nRBC: 0 % (ref 0.0–0.2)

## 2020-02-10 LAB — BASIC METABOLIC PANEL
Anion gap: 10 (ref 5–15)
BUN: 18 mg/dL (ref 8–23)
CO2: 22 mmol/L (ref 22–32)
Calcium: 9.4 mg/dL (ref 8.9–10.3)
Chloride: 104 mmol/L (ref 98–111)
Creatinine, Ser: 0.79 mg/dL (ref 0.44–1.00)
GFR calc Af Amer: >60 mL/min (ref >60)
GFR calc non Af Amer: >60 mL/min (ref >60)
Glucose, Bld: 196 mg/dL — ABNORMAL HIGH (ref 70–99)
Potassium: 4.6 mmol/L (ref 3.5–5.1)
Sodium: 136 mmol/L (ref 135–145)

## 2020-02-10 LAB — HEMOGLOBIN A1C
Hgb A1c MFr Bld: 7.5 % — ABNORMAL HIGH (ref 4.8–5.6)
Mean Plasma Glucose: 168.55 mg/dL

## 2020-02-10 LAB — GLUCOSE, CAPILLARY: Glucose-Capillary: 197 mg/dL — ABNORMAL HIGH (ref 70–99)

## 2020-02-10 LAB — SURGICAL PCR SCREEN
MRSA, PCR: NEGATIVE
Staphylococcus aureus: NEGATIVE

## 2020-02-10 LAB — TYPE AND SCREEN
ABO/RH(D): A POS
Antibody Screen: NEGATIVE

## 2020-02-10 NOTE — Progress Notes (Signed)
[id number] - [place], [place] - [address] [place] [address] Phone: [phone] Fax: [phone]      Your procedure is scheduled on Wednesday, 02/12/20 at 7:30 am.  Report to [facility] Main Entrance "A" at 5:30 am A.M., and check in at the Admitting office.  Call this number if you have problems the morning of Choi:  [phone]  Call [phone] if you have any questions prior to your Choi date Monday-Friday 8am-4pm    Remember:  Do not eat or drink after midnight the night before your Choi - Tuesday    Take these medicines the morning of Choi with A SIP OF WATER: acetaminophen (TYLENOL ARTHRITIS PAIN) budesonide-formoterol (SYMBICORT) if needed cyclobenzaprine (FLEXERIL) if needed omeprazole (PRILOSEC OTC) QNASL 80 MCG/ACT AERS if needed  DIABETES-IDDM Type 2  Patients with Insulin Pumps  . For patients with Insulin Pumps: o Contact your diabetes doctor for specific instructions before Choi. o Decrease basal insulin rates by 20% at midnight the night before Choi. o Note that if your Choi is planned to be longer than 2 hours, your insulin pump will be removed and intravenous (IV) insulin will be started and managed by the nurses and anesthesiologist. You will be able to restart your insulin pump once you are awake and able to manage it. o Make sure to bring insulin pump supplies to the hospital with you in case your site needs to be changed.  . THE MORNING OF Choi, take ___30__ units of Lantus insulin.  . Check your blood sugar at least 4 times a day, starting 2 days before Choi, to make sure that the level is not too high or low. o Check your blood sugar the morning of your Choi when you wake up and every 2 hours until you get to the Short Stay unit. . If your blood sugar is less than 70 mg/dL, you will need to treat for low blood sugar: o Do not take insulin. o Treat a low blood  sugar (less than 70 mg/dL) with  cup of clear juice (cranberry or apple), 4 glucose tablets, OR glucose gel. o Recheck blood sugar in 15 minutes after treatment (to make sure it is greater than 70 mg/dL). If your blood sugar is not greater than 70 mg/dL on recheck, call [phone] for further instructions.  As of today, stop taking all Aspirin (unless instructed by your doctor) and other Aspirin containing products, Vitamins, Fish Oils, and Herbal Medications. Also stop all NSAIDS i.e. Advil, Ibuprofen, Motrin, Aleve, Anaprox, Naproxen, BC, Goody Powders, and all Supplements.   No Smoking of any kind, Tobacco, or Alcohol products 24 hours prior to your procedure. If you use a CPAP at night, you may bring all equipment for your overnight stay.              DAY OF Choi:            Do not wear jewelry, make up, or nail polish            Do not wear lotions, powders, perfumes, or deodorant.            Do not shave 48 hours prior to Choi.             Do not bring valuables to the hospital.            [facility] is not responsible for any belongings or valuables.   Contacts, glasses,  dentures or bridgework may not be worn into Choi.      For patients admitted to the hospital, discharge time will be determined by your treatment team.   Patients discharged the day of Choi will not be allowed to drive home, and someone needs to stay with them for 24 hours.    Special instructions:   Alexis Choi  Before Choi, you can play an important role. Because skin is not sterile, your skin needs to be as free of germs as possible. You can reduce the number of germs on your skin by washing with CHG (chlorahexidine gluconate) Soap before Choi.  CHG is an antiseptic cleaner which kills germs and bonds with the skin to continue killing germs even after washing.    Oral Hygiene is also important to reduce your risk of infection.  Remember - BRUSH YOUR TEETH THE MORNING  OF Choi WITH YOUR REGULAR TOOTHPASTE  Please do not use if you have an allergy to CHG or antibacterial soaps. If your skin becomes reddened/irritated stop using the CHG.  Do not shave (including legs and underarms) for at least 48 hours prior to first CHG shower. It is OK to shave your face.  Please follow these instructions carefully.   1. Shower the NIGHT BEFORE Choi Tues and the MORNING OF Choi Wed with CHG Soap.   2. If you chose to wash your hair, wash your hair first as usual with your normal shampoo.  3. After you shampoo, rinse your hair and body thoroughly to remove the shampoo.  4. Use CHG as you would any other liquid soap. You can apply CHG directly to the skin and wash gently with a scrungie or a clean washcloth.   5. Apply the CHG Soap to your body ONLY FROM THE NECK DOWN.  Do not use on open wounds or open sores. Avoid contact with your eyes, ears, mouth and genitals (private parts). Wash Face and genitals (private parts)  with your normal soap.   6. Wash thoroughly, paying special attention to the area where your Choi will be performed.  7. Thoroughly rinse your body with warm water from the neck down.  8. DO NOT shower/wash with your normal soap after using and rinsing off the CHG Soap.  9. Pat yourself dry with a CLEAN TOWEL.  10. Wear CLEAN PAJAMAS to bed the night before Choi, wear comfortable clothes the morning of Choi  11. Place CLEAN SHEETS on your bed the night of your first shower and DO NOT SLEEP WITH PETS.   Day of Choi:   Do not apply any deodorants/lotions.  Please wear clean clothes to the hospital/Choi center.   Remember to brush your teeth WITH YOUR REGULAR TOOTHPASTE.   Please read over the following fact sheets that you were given.

## 2020-02-10 NOTE — Progress Notes (Signed)
Patient denies shortness of breath, fever, cough and chest pain.  PCP -  Dr Buckner [place], [facility] Cardiologist - denies  Chest x-ray - denies EKG - Requested 01/2020 EKG from [facility]-Faxed request Stress Test - 04/18/13 ECHO - denies Cardiac Cath - denies  Fasting Blood Sugar - 100-130s Checks Blood Sugar _4-6_ times a day  Anesthesia review: Yes-Allison, PA  Spoke with Diabetes Coordinator Jeannine to make her aware of patient's surgery on 02/12/20.  Patient has a VGO40 Insulin Pump, giving 2cc/hr (Can not adjust rate).  Patient changes pump every night.  Jeannine advised for patient to wear in on DOS.  Patient also advised to 1/2 her Lantus dose - give 30 units on DOS.  Patient takes Ozempic on Sunday.   Coronavirus Screening Covid test on 02/10/20 at 2:30 pm. Have you experienced the following symptoms:  Cough yes/no: No Fever (>100.64F)  yes/no: No Runny nose yes/no: No Sore throat yes/no: No Difficulty breathing/shortness of breath  yes/no: No  Have you traveled in the last 14 days and where? yes/no: No  Patient verbalized understanding of instructions that were given to them at the PAT appointment.

## 2020-02-11 LAB — SARS CORONAVIRUS 2 (TAT 6-24 HRS): SARS Coronavirus 2: NEGATIVE

## 2020-02-11 NOTE — Anesthesia Preprocedure Evaluation (Addendum)
Anesthesia Evaluation  Patient identified by MRN, date of birth, ID band Patient awake    Reviewed: Allergy & Precautions, NPO status , Patient's Chart, lab work & pertinent test results  History of Anesthesia Complications (+) PONV  Airway Mallampati: II  TM Distance: >3 FB Neck ROM: Full    Dental no notable dental hx.    Pulmonary neg pulmonary ROS,    Pulmonary exam normal breath sounds clear to auscultation       Cardiovascular hypertension, Pt. on medications Normal cardiovascular exam Rhythm:Regular Rate:Normal     Neuro/Psych negative neurological ROS  negative psych ROS   GI/Hepatic Neg liver ROS, GERD  Medicated and Controlled,IBS (irritable bowel syndrome)   Endo/Other  diabetes, Insulin DependentMorbid obesity (SUPER)  Renal/GU negative Renal ROS     Musculoskeletal  (+) Arthritis ,   Abdominal (+) + obese,   Peds  Hematology HLD   Anesthesia Other Findings SPONDYLOLISTHESIS, LUMBAR REGION  Reproductive/Obstetrics                           Anesthesia Physical Anesthesia Plan  ASA: IV  Anesthesia Plan: General   Post-op Pain Management:    Induction: Intravenous  PONV Risk Score and Plan: 3 and Ondansetron, Dexamethasone, Midazolam, Treatment may vary due to age or medical condition and Scopolamine patch - Pre-op  Airway Management Planned: Oral ETT  Additional Equipment: Arterial line  Intra-op Plan:   Post-operative Plan: Extubation in OR  Informed Consent: I have reviewed the patients History and Physical, chart, labs and discussed the procedure including the risks, benefits and alternatives for the proposed anesthesia with the patient or authorized representative who has indicated his/her understanding and acceptance.     Dental advisory given  Plan Discussed with: CRNA  Anesthesia Plan Comments: (Reviewed PAT note written 02/11/2020 by Shonna Chock,  PA-C. Has V-Go insulin pump Q 24 hours. )      Anesthesia Quick Evaluation

## 2020-02-11 NOTE — Progress Notes (Signed)
Anesthesia Chart Review:  Case: [id number] Date/Time: 02/12/20 0715   Procedure: POSTERIOR LUMBAR INTERBODY FUSION, INTERBODY PROSTHESIS, POSTERIOR INSTRUMENTATION LUMBAR 3- LUMBAR 4, LUMBAR 4- LUMBAR 5 (N/A ) - POSTERIOR LUMBAR INTERBODY FUSION, INTERBODY PROSTHESIS, POSTERIOR INSTRUMENTATION LUMBAR 3- LUMBAR 4, LUMBAR 4- LUMBAR 5   Anesthesia type: General   Pre-op diagnosis: SPONDYLOLISTHESIS, LUMBAR REGION   Location: [facility] ROOM 21 / [facility]   Surgeons: Newman Pies, MD      DISCUSSION: Patient is a 64 year old female scheduled for the above procedure.  History includes never smoker, post-operative N/V, DM2, HTN, HLD, paroxsymal atrial tachycardia (2013), GERD, IBS, chronic back pain, chronic LE edema, cholecystectomy (2017), TKA (right 2009, left 2014). BMI is consistent with morbid obesity.  She has a Once Daily V-Go40 insulin delivery device that she replaces each night. It does not allow for adjusting the basal rate. She is also on daily Lantus 60 Units and weekly Ozempic 1 mg She is not prone to morning hypoglycemia (fasting CBGs ~ 100-130's). She is a first case. Per discussion with DM Coordinator, she will change V-Go pump out as usual the evening before surgery with anticipated plan to change to IV insulin intraoperatively based on length of surgery.    Called WBC 14.8K to [facility] at Dr. Arnoldo Morale' office, and asked that she let me know if Dr. Arnoldo Morale had any additional preoperative recommendations. She denied shortness of breath, cough, fever, chest pain at PAT RN visit. 02/10/20 COVID-19 test negative.  Anesthesia team to evaluate on the day of surgery.   VS: BP (!) 141/88   Pulse 83   Temp 36.9 C (Oral)   Resp 18   Ht 5' (1.524 m)   Wt 115.8 kg   LMP  (LMP Unknown)   SpO2 97%   BMI 49.85 kg/m     PROVIDERS: Serita Grammes, MD is PCP ([place]).  - She is not routinely followed by cardiology, but was referred to [facility] by  her PCP in 2013 for PSVT. She was seen by Ileene Hutchinson, PA-C on 05/14/12.  Her symptoms were better controlled with verapamil and caffeine restriction.  Virl Axe, MD had recommended a Myoview prior to impending knee surgery which was ultimately done on 04/18/13 and showed: Normal stress nuclear study. This is a low risk scan. LV Ejection Fraction: 65%. LV Wall Motion: Normal Wall motion.   LABS: Preoperative labs noted. WBC 14.8. See DISCUSSION. (all labs ordered are listed, but only abnormal results are displayed)  Labs Reviewed  CBC - Abnormal; Notable for the following components:      Result Value   WBC 14.8 (*)    All other components within normal limits  BASIC METABOLIC PANEL - Abnormal; Notable for the following components:   Glucose, Bld 196 (*)    All other components within normal limits  HEMOGLOBIN A1C - Abnormal; Notable for the following components:   Hgb A1c MFr Bld 7.5 (*)    All other components within normal limits  GLUCOSE, CAPILLARY - Abnormal; Notable for the following components:   Glucose-Capillary 197 (*)    All other components within normal limits  SURGICAL PCR SCREEN  TYPE AND SCREEN     IMAGES: MRI L-spine 01/07/20 [facility]; report in [facility]/PACS): IMPRESSION: 1. Unchanged severe spinal canal stenosis at L4-5 with moderate bilateral neural foraminal stenosis. 2. Unchanged moderate right L5-S1 neural foraminal stenosis.   EKG: 01/31/20 [facility]): Sinus Rhythm, Normal ECG   CV:  Nuclear stress test 04/18/13: Overall Impression:   Normal stress nuclear study. This is a low risk scan. LV Ejection Fraction: 65%.  LV Wall Motion:  Normal Wall Motion.  Carotid US 11/03/11 [facility]): Impression:  1.  Bilateral carotid demonstrates no hemodynamically significant stenosis. 2.  Bilateral vertebral artery demonstrates appropriate antegrade flow.  According to 05/14/12 note by Dr. Graciela Husbands: Event monitor (~ 10/26/11)  strips reviewed which show occasional PACs and brief, nonsustained atrial tachycardia    Past Medical History:  Diagnosis Date  . Chronic back pain   . Diabetes mellitus   . Dysrhythmia    pat  . Edema    lower extremity  . Encounter for long-term (current) use of other medications   . Family history of adverse reaction to anesthesia    mother slow to wake  . Fluid retention   . GERD (gastroesophageal reflux disease)   . Hyperlipidemia    no meds  . Hypertension   . IBS (irritable bowel syndrome)   . Osteoarthritis of knee   . Pneumonia    several  . PONV (postoperative nausea and vomiting)    occ  . Unspecified vitamin D deficiency     Past Surgical History:  Procedure Laterality Date  . CHOLECYSTECTOMY N/A 06/30/2016   Procedure: LAPAROSCOPIC CHOLECYSTECTOMY;  Surgeon: De Blanch Kinsinger, MD;  Location: [facility] ORS;  Service: General;  Laterality: N/A;  . COLONOSCOPY  10/2020  . KNEE ARTHROSCOPY Left 06  . knee replacment Right   . right knee arthroscopy    . TONSILLECTOMY  63  . TOTAL KNEE ARTHROPLASTY Left 06/03/2013   Procedure: LEFT TOTAL KNEE ARTHROPLASTY;  Surgeon: Dannielle Huh, MD;  Location: [facility] OR;  Service: Orthopedics;  Laterality: Left;  . TUBAL LIGATION  89  . UPPER GI ENDOSCOPY      MEDICATIONS: . acetaminophen (TYLENOL ARTHRITIS PAIN) 650 MG CR tablet  . aspirin 81 MG tablet  . b complex vitamins capsule  . budesonide-formoterol (SYMBICORT) 160-4.5 MCG/ACT inhaler  . celecoxib (CELEBREX) 200 MG capsule  . Cholecalciferol 25 MCG (1000 UT) tablet  . cyclobenzaprine (FLEXERIL) 10 MG tablet  . dicyclomine (BENTYL) 10 MG capsule  . HYDROcodone-acetaminophen (NORCO/VICODIN) 5-325 MG tablet  . hyoscyamine (LEVSIN SL) 0.125 MG SL tablet  . insulin aspart (NOVOLOG) 100 UNIT/ML injection  . insulin glargine (LANTUS) 100 UNIT/ML injection  . Insulin Pen Needle (PEN NEEDLES 31GX5/16") 31G X 8 MM MISC  . Insulin Syringe-Needle U-100 (ACCUSURE INS SYR  1CC/31GX5/16") 31G X 5/16" 1 ML MISC  . Magnesium Gluconate 250 MG TABS  . Multiple Vitamin (MULTIVITAMIN) capsule  . omeprazole (PRILOSEC OTC) 20 MG tablet  . QNASL 80 MCG/ACT AERS  . Semaglutide, 1 MG/DOSE, (OZEMPIC, 1 MG/DOSE,) 2 MG/1.5ML SOPN  . sodium chloride (MURO 128) 5 % ophthalmic ointment  . telmisartan-hydrochlorothiazide (MICARDIS HCT) 80-12.5 MG tablet  . verapamil (CALAN-SR) 120 MG CR tablet   No current facility-administered medications for this encounter.    Shonna Chock, PA-C Surgical Short Stay/Anesthesiology [facility] Phone [phone] [facility] Phone [phone] 02/11/2020 10:51 AM

## 2020-02-12 ENCOUNTER — Inpatient Hospital Stay (HOSPITAL_COMMUNITY): Payer: BC Managed Care – PPO

## 2020-02-12 ENCOUNTER — Other Ambulatory Visit: Payer: Self-pay

## 2020-02-12 ENCOUNTER — Inpatient Hospital Stay (HOSPITAL_COMMUNITY)
Admission: RE | Disposition: A | Discharge status: Home Health | Payer: Self-pay | Source: Home / Self Care | Attending: Neurosurgery

## 2020-02-12 ENCOUNTER — Inpatient Hospital Stay (HOSPITAL_COMMUNITY): Payer: BC Managed Care – PPO | Admitting: Vascular Surgery

## 2020-02-12 ENCOUNTER — Encounter (HOSPITAL_COMMUNITY): Payer: Self-pay | Admitting: Neurosurgery

## 2020-02-12 ENCOUNTER — Inpatient Hospital Stay (HOSPITAL_COMMUNITY)
Admission: RE | Admit: 2020-02-12 | Discharge: 2020-02-13 | DRG: 454 | Disposition: A | Discharge status: Home Health | Payer: BC Managed Care – PPO | Attending: Neurosurgery | Admitting: Neurosurgery

## 2020-02-12 DIAGNOSIS — M47816 Spondylosis without myelopathy or radiculopathy, lumbar region: Secondary | ICD-10-CM | POA: Diagnosis present

## 2020-02-12 DIAGNOSIS — Z79899 Other long term (current) drug therapy: Secondary | ICD-10-CM | POA: Diagnosis not present

## 2020-02-12 DIAGNOSIS — E119 Type 2 diabetes mellitus without complications: Secondary | ICD-10-CM | POA: Diagnosis present

## 2020-02-12 DIAGNOSIS — E785 Hyperlipidemia, unspecified: Secondary | ICD-10-CM | POA: Diagnosis present

## 2020-02-12 DIAGNOSIS — Z7951 Long term (current) use of inhaled steroids: Secondary | ICD-10-CM

## 2020-02-12 DIAGNOSIS — Z91040 Latex allergy status: Secondary | ICD-10-CM | POA: Diagnosis not present

## 2020-02-12 DIAGNOSIS — M48062 Spinal stenosis, lumbar region with neurogenic claudication: Secondary | ICD-10-CM | POA: Diagnosis present

## 2020-02-12 DIAGNOSIS — M5116 Intervertebral disc disorders with radiculopathy, lumbar region: Secondary | ICD-10-CM | POA: Diagnosis present

## 2020-02-12 DIAGNOSIS — M4316 Spondylolisthesis, lumbar region: Secondary | ICD-10-CM | POA: Diagnosis present

## 2020-02-12 DIAGNOSIS — Z888 Allergy status to other drugs, medicaments and biological substances status: Secondary | ICD-10-CM

## 2020-02-12 DIAGNOSIS — I10 Essential (primary) hypertension: Secondary | ICD-10-CM | POA: Diagnosis present

## 2020-02-12 DIAGNOSIS — K219 Gastro-esophageal reflux disease without esophagitis: Secondary | ICD-10-CM | POA: Diagnosis present

## 2020-02-12 DIAGNOSIS — Z419 Encounter for procedure for purposes other than remedying health state, unspecified: Secondary | ICD-10-CM

## 2020-02-12 DIAGNOSIS — Z6841 Body Mass Index (BMI) 40.0 and over, adult: Secondary | ICD-10-CM | POA: Diagnosis not present

## 2020-02-12 DIAGNOSIS — Z96652 Presence of left artificial knee joint: Secondary | ICD-10-CM | POA: Diagnosis present

## 2020-02-12 DIAGNOSIS — Z7982 Long term (current) use of aspirin: Secondary | ICD-10-CM

## 2020-02-12 LAB — GLUCOSE, CAPILLARY
Glucose-Capillary: 166 mg/dL — ABNORMAL HIGH (ref 70–99)
Glucose-Capillary: 146 mg/dL — ABNORMAL HIGH (ref 70–99)
Glucose-Capillary: 165 mg/dL — ABNORMAL HIGH (ref 70–99)
Glucose-Capillary: 196 mg/dL — ABNORMAL HIGH (ref 70–99)
Glucose-Capillary: 220 mg/dL — ABNORMAL HIGH (ref 70–99)
Glucose-Capillary: 256 mg/dL — ABNORMAL HIGH (ref 70–99)
Glucose-Capillary: 221 mg/dL — ABNORMAL HIGH (ref 70–99)
Glucose-Capillary: 217 mg/dL — ABNORMAL HIGH (ref 70–99)

## 2020-02-12 SURGERY — POSTERIOR LUMBAR FUSION 2 LEVEL
Anesthesia: General

## 2020-02-12 MED ORDER — PROMETHAZINE HCL 25 MG/ML IJ SOLN
INTRAMUSCULAR | Status: AC
  Filled 2020-02-12: qty 1

## 2020-02-12 MED ORDER — ACETAMINOPHEN 10 MG/ML IV SOLN: 1000.0000 mg | Freq: Once | INTRAVENOUS | Status: DC | PRN

## 2020-02-12 MED ORDER — HYOSCYAMINE SULFATE 0.125 MG SL SUBL
0.1250 mg | SUBLINGUAL_TABLET | Freq: Every day | SUBLINGUAL | Status: DC | PRN
  Filled 2020-02-12: qty 1

## 2020-02-12 MED ORDER — FLUTICASONE PROPIONATE 50 MCG/ACT NA SUSP: 1.0000 | Freq: Every day | NASAL | Status: DC

## 2020-02-12 MED ORDER — PHENYLEPHRINE HCL-NACL 10-0.9 MG/250ML-% IV SOLN
INTRAVENOUS | Status: DC | PRN
  Administered 2020-02-12: 25 ug/min via INTRAVENOUS

## 2020-02-12 MED ORDER — BUPIVACAINE LIPOSOME 1.3 % IJ SUSP
INTRAMUSCULAR | Status: DC | PRN
  Administered 2020-02-12: 20 mL

## 2020-02-12 MED ORDER — DOCUSATE SODIUM 100 MG PO CAPS
100.0000 mg | ORAL_CAPSULE | Freq: Two times a day (BID) | ORAL | Status: DC
  Administered 2020-02-12: 100 mg via ORAL
  Filled 2020-02-12 (×2): qty 1

## 2020-02-12 MED ORDER — ONDANSETRON HCL 4 MG/2ML IJ SOLN: 4.0000 mg | Freq: Four times a day (QID) | INTRAMUSCULAR | Status: DC | PRN

## 2020-02-12 MED ORDER — MORPHINE SULFATE (PF) 4 MG/ML IV SOLN: 4.0000 mg | INTRAVENOUS | Status: DC | PRN

## 2020-02-12 MED ORDER — ACETAMINOPHEN 650 MG RE SUPP: 650.0000 mg | RECTAL | Status: DC | PRN

## 2020-02-12 MED ORDER — LIDOCAINE-EPINEPHRINE 1 %-1:100000 IJ SOLN
INTRAMUSCULAR | Status: AC
  Filled 2020-02-12: qty 1

## 2020-02-12 MED ORDER — CEFAZOLIN SODIUM-DEXTROSE 2-4 GM/100ML-% IV SOLN
INTRAVENOUS | Status: AC
  Filled 2020-02-12: qty 100

## 2020-02-12 MED ORDER — LACTATED RINGERS IV SOLN: INTRAVENOUS | Status: DC | PRN

## 2020-02-12 MED ORDER — MIDAZOLAM HCL 2 MG/2ML IJ SOLN
INTRAMUSCULAR | Status: AC
  Filled 2020-02-12: qty 2

## 2020-02-12 MED ORDER — PROPOFOL 10 MG/ML IV BOLUS
INTRAVENOUS | Status: DC | PRN
  Administered 2020-02-12: 50 mg via INTRAVENOUS
  Administered 2020-02-12: 150 mg via INTRAVENOUS

## 2020-02-12 MED ORDER — CEFAZOLIN SODIUM 1 G IJ SOLR
INTRAMUSCULAR | Status: AC
  Filled 2020-02-12: qty 20

## 2020-02-12 MED ORDER — OXYCODONE HCL 5 MG/5ML PO SOLN: 5.0000 mg | Freq: Once | ORAL | Status: DC | PRN

## 2020-02-12 MED ORDER — SODIUM CHLORIDE 0.9 % IV SOLN: INTRAVENOUS | Status: DC | PRN

## 2020-02-12 MED ORDER — ONDANSETRON HCL 4 MG/2ML IJ SOLN
INTRAMUSCULAR | Status: AC
  Filled 2020-02-12: qty 2

## 2020-02-12 MED ORDER — SODIUM CHLORIDE 0.9% FLUSH: 3.0000 mL | INTRAVENOUS | Status: DC | PRN

## 2020-02-12 MED ORDER — DICYCLOMINE HCL 10 MG PO CAPS
10.0000 mg | ORAL_CAPSULE | Freq: Two times a day (BID) | ORAL | Status: DC
  Administered 2020-02-12: 10 mg via ORAL
  Filled 2020-02-12 (×2): qty 1

## 2020-02-12 MED ORDER — CEFAZOLIN SODIUM-DEXTROSE 2-4 GM/100ML-% IV SOLN
2.0000 g | INTRAVENOUS | Status: AC
  Administered 2020-02-12 (×2): 2 g via INTRAVENOUS

## 2020-02-12 MED ORDER — TELMISARTAN-HCTZ 80-12.5 MG PO TABS: 1.0000 | ORAL_TABLET | Freq: Every day | ORAL | Status: DC

## 2020-02-12 MED ORDER — DEXAMETHASONE SODIUM PHOSPHATE 10 MG/ML IJ SOLN
INTRAMUSCULAR | Status: DC | PRN
  Administered 2020-02-12: 4 mg via INTRAVENOUS

## 2020-02-12 MED ORDER — ACETAMINOPHEN 325 MG PO TABS
650.0000 mg | ORAL_TABLET | ORAL | Status: DC | PRN
  Administered 2020-02-13: 650 mg via ORAL
  Filled 2020-02-12 (×2): qty 2

## 2020-02-12 MED ORDER — INSULIN ASPART 100 UNIT/ML ~~LOC~~ SOLN
0.0000 [IU] | SUBCUTANEOUS | Status: DC
  Administered 2020-02-12: 7 [IU] via SUBCUTANEOUS

## 2020-02-12 MED ORDER — HYDROMORPHONE HCL 1 MG/ML IJ SOLN
INTRAMUSCULAR | Status: DC | PRN
  Administered 2020-02-12: .5 mg via INTRAVENOUS

## 2020-02-12 MED ORDER — HYDROMORPHONE HCL 1 MG/ML IJ SOLN
INTRAMUSCULAR | Status: AC
  Filled 2020-02-12: qty 1

## 2020-02-12 MED ORDER — CHLORHEXIDINE GLUCONATE CLOTH 2 % EX PADS: 6.0000 | MEDICATED_PAD | Freq: Once | CUTANEOUS | Status: DC

## 2020-02-12 MED ORDER — INSULIN PUMP
Freq: Three times a day (TID) | SUBCUTANEOUS | Status: DC
  Administered 2020-02-12: 8 via SUBCUTANEOUS
  Filled 2020-02-12: qty 1

## 2020-02-12 MED ORDER — PHENYLEPHRINE 40 MCG/ML (10ML) SYRINGE FOR IV PUSH (FOR BLOOD PRESSURE SUPPORT)
PREFILLED_SYRINGE | INTRAVENOUS | Status: DC | PRN
  Administered 2020-02-12: 40 ug via INTRAVENOUS
  Administered 2020-02-12 (×2): 80 ug via INTRAVENOUS

## 2020-02-12 MED ORDER — THROMBIN 5000 UNITS EX SOLR
CUTANEOUS | Status: AC
  Filled 2020-02-12: qty 5000

## 2020-02-12 MED ORDER — ROCURONIUM BROMIDE 10 MG/ML (PF) SYRINGE
PREFILLED_SYRINGE | INTRAVENOUS | Status: AC
  Filled 2020-02-12: qty 10

## 2020-02-12 MED ORDER — THROMBIN 5000 UNITS EX SOLR
OROMUCOSAL | Status: DC | PRN
  Administered 2020-02-12 (×2): 5 mL via TOPICAL

## 2020-02-12 MED ORDER — OXYCODONE HCL 5 MG PO TABS
5.0000 mg | ORAL_TABLET | ORAL | Status: DC | PRN
  Administered 2020-02-12: 5 mg via ORAL
  Filled 2020-02-12: qty 1

## 2020-02-12 MED ORDER — INSULIN ASPART 100 UNIT/ML ~~LOC~~ SOLN
SUBCUTANEOUS | Status: AC
  Filled 2020-02-12: qty 1

## 2020-02-12 MED ORDER — CYCLOBENZAPRINE HCL 10 MG PO TABS
10.0000 mg | ORAL_TABLET | Freq: Three times a day (TID) | ORAL | Status: DC | PRN
  Administered 2020-02-12 – 2020-02-13 (×2): 10 mg via ORAL
  Filled 2020-02-12 (×2): qty 1

## 2020-02-12 MED ORDER — SODIUM CHLORIDE (PF) 0.9 % IJ SOLN
INTRAMUSCULAR | Status: AC
  Filled 2020-02-12: qty 10

## 2020-02-12 MED ORDER — LIDOCAINE 2% (20 MG/ML) 5 ML SYRINGE
INTRAMUSCULAR | Status: DC | PRN
  Administered 2020-02-12: 40 mg via INTRAVENOUS

## 2020-02-12 MED ORDER — ZOLPIDEM TARTRATE 5 MG PO TABS: 5.0000 mg | ORAL_TABLET | Freq: Every evening | ORAL | Status: DC | PRN

## 2020-02-12 MED ORDER — CEFAZOLIN SODIUM-DEXTROSE 2-4 GM/100ML-% IV SOLN
2.0000 g | Freq: Three times a day (TID) | INTRAVENOUS | Status: AC
  Administered 2020-02-12 – 2020-02-13 (×2): 2 g via INTRAVENOUS
  Filled 2020-02-12 (×2): qty 100

## 2020-02-12 MED ORDER — HYDROMORPHONE HCL 1 MG/ML IJ SOLN
0.2500 mg | INTRAMUSCULAR | Status: DC | PRN
  Administered 2020-02-12 (×2): 0.5 mg via INTRAVENOUS

## 2020-02-12 MED ORDER — PROPOFOL 10 MG/ML IV BOLUS
INTRAVENOUS | Status: AC
  Filled 2020-02-12: qty 40

## 2020-02-12 MED ORDER — BISACODYL 10 MG RE SUPP: 10.0000 mg | Freq: Every day | RECTAL | Status: DC | PRN

## 2020-02-12 MED ORDER — ONDANSETRON HCL 4 MG PO TABS: 4.0000 mg | ORAL_TABLET | Freq: Four times a day (QID) | ORAL | Status: DC | PRN

## 2020-02-12 MED ORDER — PROMETHAZINE HCL 25 MG/ML IJ SOLN
6.2500 mg | INTRAMUSCULAR | Status: DC | PRN
  Administered 2020-02-12: 6.25 mg via INTRAVENOUS

## 2020-02-12 MED ORDER — ARTIFICIAL TEARS OPHTHALMIC OINT
TOPICAL_OINTMENT | OPHTHALMIC | Status: DC | PRN
  Administered 2020-02-12: 1 via OPHTHALMIC

## 2020-02-12 MED ORDER — SODIUM CHLORIDE 0.9 % IV SOLN
INTRAVENOUS | Status: DC | PRN
  Administered 2020-02-12: 07:00:00 500 mL

## 2020-02-12 MED ORDER — ALBUMIN HUMAN 5 % IV SOLN: INTRAVENOUS | Status: DC | PRN

## 2020-02-12 MED ORDER — FENTANYL CITRATE (PF) 250 MCG/5ML IJ SOLN
INTRAMUSCULAR | Status: AC
  Filled 2020-02-12: qty 5

## 2020-02-12 MED ORDER — BACITRACIN ZINC 500 UNIT/GM EX OINT
TOPICAL_OINTMENT | CUTANEOUS | Status: AC
  Filled 2020-02-12: qty 28.35

## 2020-02-12 MED ORDER — ACETAMINOPHEN 500 MG PO TABS
1000.0000 mg | ORAL_TABLET | Freq: Four times a day (QID) | ORAL | Status: DC
  Administered 2020-02-12 – 2020-02-13 (×2): 1000 mg via ORAL
  Filled 2020-02-12 (×2): qty 2

## 2020-02-12 MED ORDER — OXYCODONE HCL 5 MG PO TABS
10.0000 mg | ORAL_TABLET | ORAL | Status: DC | PRN
  Administered 2020-02-12 – 2020-02-13 (×4): 10 mg via ORAL
  Filled 2020-02-12 (×4): qty 2

## 2020-02-12 MED ORDER — SEMAGLUTIDE (1 MG/DOSE) 2 MG/1.5ML ~~LOC~~ SOPN: 1.0000 mg | PEN_INJECTOR | SUBCUTANEOUS | Status: DC

## 2020-02-12 MED ORDER — HYDROXYZINE HCL 50 MG/ML IM SOLN
50.0000 mg | Freq: Four times a day (QID) | INTRAMUSCULAR | Status: DC | PRN
  Administered 2020-02-12: 50 mg via INTRAMUSCULAR
  Filled 2020-02-12: qty 1

## 2020-02-12 MED ORDER — SODIUM CHLORIDE 0.9 % IV SOLN
250.0000 mL | INTRAVENOUS | Status: DC
  Administered 2020-02-12: 250 mL via INTRAVENOUS

## 2020-02-12 MED ORDER — SODIUM CHLORIDE 0.9% FLUSH
3.0000 mL | Freq: Two times a day (BID) | INTRAVENOUS | Status: DC
  Administered 2020-02-12: 3 mL via INTRAVENOUS

## 2020-02-12 MED ORDER — MOMETASONE FURO-FORMOTEROL FUM 200-5 MCG/ACT IN AERO: 2.0000 | INHALATION_SPRAY | Freq: Two times a day (BID) | RESPIRATORY_TRACT | Status: DC

## 2020-02-12 MED ORDER — ONDANSETRON HCL 4 MG/2ML IJ SOLN
INTRAMUSCULAR | Status: DC | PRN
  Administered 2020-02-12: 4 mg via INTRAVENOUS

## 2020-02-12 MED ORDER — BACITRACIN ZINC 500 UNIT/GM EX OINT
TOPICAL_OINTMENT | CUTANEOUS | Status: DC | PRN
  Administered 2020-02-12: 1 via TOPICAL

## 2020-02-12 MED ORDER — ARTIFICIAL TEARS OPHTHALMIC OINT
TOPICAL_OINTMENT | OPHTHALMIC | Status: AC
  Filled 2020-02-12: qty 10.5

## 2020-02-12 MED ORDER — BUPIVACAINE LIPOSOME 1.3 % IJ SUSP
20.0000 mL | Freq: Once | INTRAMUSCULAR | Status: DC
  Filled 2020-02-12: qty 20

## 2020-02-12 MED ORDER — HYDROCHLOROTHIAZIDE 12.5 MG PO CAPS
12.5000 mg | ORAL_CAPSULE | Freq: Every day | ORAL | Status: DC
  Administered 2020-02-12: 12.5 mg via ORAL
  Filled 2020-02-12 (×2): qty 1

## 2020-02-12 MED ORDER — ACETAMINOPHEN 500 MG PO TABS: 1000.0000 mg | ORAL_TABLET | Freq: Once | ORAL | Status: DC

## 2020-02-12 MED ORDER — VERAPAMIL HCL ER 120 MG PO TBCR
120.0000 mg | EXTENDED_RELEASE_TABLET | Freq: Every day | ORAL | Status: DC
  Administered 2020-02-12: 120 mg via ORAL
  Filled 2020-02-12 (×2): qty 1

## 2020-02-12 MED ORDER — FENTANYL CITRATE (PF) 250 MCG/5ML IJ SOLN
INTRAMUSCULAR | Status: DC | PRN
  Administered 2020-02-12 (×6): 50 ug via INTRAVENOUS
  Administered 2020-02-12 (×2): 100 ug via INTRAVENOUS

## 2020-02-12 MED ORDER — SUGAMMADEX SODIUM 200 MG/2ML IV SOLN
INTRAVENOUS | Status: DC | PRN
  Administered 2020-02-12: 300 mg via INTRAVENOUS

## 2020-02-12 MED ORDER — LIDOCAINE-EPINEPHRINE 1 %-1:100000 IJ SOLN
INTRAMUSCULAR | Status: DC | PRN
  Administered 2020-02-12: 10 mL

## 2020-02-12 MED ORDER — SCOPOLAMINE 1 MG/3DAYS TD PT72
MEDICATED_PATCH | TRANSDERMAL | Status: DC | PRN
  Administered 2020-02-12: 1 via TRANSDERMAL

## 2020-02-12 MED ORDER — DEXAMETHASONE SODIUM PHOSPHATE 10 MG/ML IJ SOLN
INTRAMUSCULAR | Status: AC
  Filled 2020-02-12: qty 1

## 2020-02-12 MED ORDER — PHENYLEPHRINE 40 MCG/ML (10ML) SYRINGE FOR IV PUSH (FOR BLOOD PRESSURE SUPPORT)
PREFILLED_SYRINGE | INTRAVENOUS | Status: AC
  Filled 2020-02-12: qty 10

## 2020-02-12 MED ORDER — INSULIN GLARGINE 100 UNIT/ML ~~LOC~~ SOLN
30.0000 [IU] | Freq: Every day | SUBCUTANEOUS | Status: DC
  Administered 2020-02-13: 30 [IU] via SUBCUTANEOUS
  Filled 2020-02-12: qty 0.3

## 2020-02-12 MED ORDER — LIDOCAINE 2% (20 MG/ML) 5 ML SYRINGE
INTRAMUSCULAR | Status: AC
  Filled 2020-02-12: qty 5

## 2020-02-12 MED ORDER — MENTHOL 3 MG MT LOZG
1.0000 | LOZENGE | OROMUCOSAL | Status: DC | PRN
  Filled 2020-02-12: qty 9

## 2020-02-12 MED ORDER — 0.9 % SODIUM CHLORIDE (POUR BTL) OPTIME
TOPICAL | Status: DC | PRN
  Administered 2020-02-12: 07:00:00 1000 mL

## 2020-02-12 MED ORDER — OMEPRAZOLE MAGNESIUM 20 MG PO TBEC: 20.0000 mg | DELAYED_RELEASE_TABLET | Freq: Two times a day (BID) | ORAL | Status: DC

## 2020-02-12 MED ORDER — OXYCODONE HCL 5 MG PO TABS: 5.0000 mg | ORAL_TABLET | Freq: Once | ORAL | Status: DC | PRN

## 2020-02-12 MED ORDER — LABETALOL HCL 5 MG/ML IV SOLN
INTRAVENOUS | Status: AC
  Filled 2020-02-12: qty 4

## 2020-02-12 MED ORDER — ROCURONIUM BROMIDE 10 MG/ML (PF) SYRINGE
PREFILLED_SYRINGE | INTRAVENOUS | Status: DC | PRN
  Administered 2020-02-12: 30 mg via INTRAVENOUS
  Administered 2020-02-12: 40 mg via INTRAVENOUS
  Administered 2020-02-12: 20 mg via INTRAVENOUS
  Administered 2020-02-12: 30 mg via INTRAVENOUS
  Administered 2020-02-12: 60 mg via INTRAVENOUS
  Administered 2020-02-12: 20 mg via INTRAVENOUS

## 2020-02-12 MED ORDER — HYDROMORPHONE HCL 1 MG/ML IJ SOLN
INTRAMUSCULAR | Status: AC
  Filled 2020-02-12: qty 0.5

## 2020-02-12 MED ORDER — LABETALOL HCL 5 MG/ML IV SOLN
INTRAVENOUS | Status: DC | PRN
  Administered 2020-02-12: 5 mg via INTRAVENOUS

## 2020-02-12 MED ORDER — MIDAZOLAM HCL 5 MG/5ML IJ SOLN
INTRAMUSCULAR | Status: DC | PRN
  Administered 2020-02-12 (×2): 1 mg via INTRAVENOUS

## 2020-02-12 MED ORDER — IRBESARTAN 300 MG PO TABS
300.0000 mg | ORAL_TABLET | Freq: Every day | ORAL | Status: DC
  Administered 2020-02-12: 300 mg via ORAL
  Filled 2020-02-12 (×2): qty 1

## 2020-02-12 MED ORDER — PHENOL 1.4 % MT LIQD: 1.0000 | OROMUCOSAL | Status: DC | PRN

## 2020-02-12 SURGICAL SUPPLY — 76 items
APL SKNCLS STERI-STRIP NONHPOA (GAUZE/BANDAGES/DRESSINGS) ×1
BAG DECANTER FOR FLEXI CONT (MISCELLANEOUS) ×2 IMPLANT
BENZOIN TINCTURE PRP APPL 2/3 (GAUZE/BANDAGES/DRESSINGS) ×2 IMPLANT
BLADE CLIPPER SURG (BLADE) IMPLANT
BUR MATCHSTICK NEURO 3.0 LAGG (BURR) ×2 IMPLANT
BUR PRECISION FLUTE 6.0 (BURR) ×2 IMPLANT
CAGE ALTERA 10X31X9-13 15D (Cage) ×1 IMPLANT
CANISTER SUCT 3000ML PPV (MISCELLANEOUS) ×2 IMPLANT
CAP LOCK DLX THRD (Cap) ×6 IMPLANT
CARTRIDGE OIL MAESTRO DRILL (MISCELLANEOUS) ×1 IMPLANT
CNTNR URN SCR LID CUP LEK RST (MISCELLANEOUS) ×1 IMPLANT
CONN CROSS 6.35X38-50 (Connector) ×2 IMPLANT
CONNECTOR CROSS 6.35X38-50 (Connector) IMPLANT
CONT SPEC 4OZ STRL OR WHT (MISCELLANEOUS) ×2
COVER BACK TABLE 60X90IN (DRAPES) ×2 IMPLANT
COVER WAND RF STERILE (DRAPES) ×2 IMPLANT
DECANTER SPIKE VIAL GLASS SM (MISCELLANEOUS) ×2 IMPLANT
DIFFUSER DRILL AIR PNEUMATIC (MISCELLANEOUS) ×2 IMPLANT
DRAPE C-ARM 42X72 X-RAY (DRAPES) ×4 IMPLANT
DRAPE HALF SHEET 40X57 (DRAPES) ×3 IMPLANT
DRAPE LAPAROTOMY 100X72X124 (DRAPES) ×2 IMPLANT
DRAPE SURG 17X23 STRL (DRAPES) ×8 IMPLANT
DRSG OPSITE POSTOP 4X6 (GAUZE/BANDAGES/DRESSINGS) ×1 IMPLANT
ELECT BLADE 4.0 EZ CLEAN MEGAD (MISCELLANEOUS) ×2
ELECT REM PT RETURN 9FT ADLT (ELECTROSURGICAL) ×2
ELECTRODE BLDE 4.0 EZ CLN MEGD (MISCELLANEOUS) ×1 IMPLANT
ELECTRODE REM PT RTRN 9FT ADLT (ELECTROSURGICAL) ×1 IMPLANT
GAUZE 4X4 16PLY RFD (DISPOSABLE) ×2 IMPLANT
GAUZE SPONGE 4X4 12PLY STRL (GAUZE/BANDAGES/DRESSINGS) ×2 IMPLANT
GLOVE BIO SURGEON STRL SZ8 (GLOVE) ×4 IMPLANT
GLOVE BIO SURGEON STRL SZ8.5 (GLOVE) ×2 IMPLANT
GLOVE BIOGEL PI IND STRL 6.5 (GLOVE) IMPLANT
GLOVE BIOGEL PI INDICATOR 6.5 (GLOVE) ×1
GLOVE EXAM NITRILE XL STR (GLOVE) IMPLANT
GLOVE SURG SS PI 6.0 STRL IVOR (GLOVE) ×2 IMPLANT
GLOVE SURG SS PI 6.5 STRL IVOR (GLOVE) ×4 IMPLANT
GLOVE SURG SS PI 8.0 STRL IVOR (GLOVE) ×5 IMPLANT
GLOVE SURG SS PI 8.5 STRL IVOR (GLOVE) ×3
GLOVE SURG SS PI 8.5 STRL STRW (GLOVE) IMPLANT
GOWN STRL REUS W/ TWL LRG LVL3 (GOWN DISPOSABLE) IMPLANT
GOWN STRL REUS W/ TWL XL LVL3 (GOWN DISPOSABLE) ×2 IMPLANT
GOWN STRL REUS W/TWL 2XL LVL3 (GOWN DISPOSABLE) IMPLANT
GOWN STRL REUS W/TWL LRG LVL3 (GOWN DISPOSABLE) ×4
GOWN STRL REUS W/TWL XL LVL3 (GOWN DISPOSABLE) ×6
HEMOSTAT POWDER KIT SURGIFOAM (HEMOSTASIS) ×3 IMPLANT
KIT BASIN OR (CUSTOM PROCEDURE TRAY) ×2 IMPLANT
KIT TURNOVER KIT B (KITS) ×2 IMPLANT
MILL MEDIUM DISP (BLADE) ×1 IMPLANT
NDL HYPO 21X1.5 SAFETY (NEEDLE) IMPLANT
NEEDLE HYPO 21X1.5 SAFETY (NEEDLE) ×2 IMPLANT
NEEDLE HYPO 22GX1.5 SAFETY (NEEDLE) ×2 IMPLANT
NS IRRIG 1000ML POUR BTL (IV SOLUTION) ×2 IMPLANT
OIL CARTRIDGE MAESTRO DRILL (MISCELLANEOUS) ×2
PACK LAMINECTOMY NEURO (CUSTOM PROCEDURE TRAY) ×2 IMPLANT
PAD ARMBOARD 7.5X6 YLW CONV (MISCELLANEOUS) ×3 IMPLANT
PATTIES SURGICAL .5 X.5 (GAUZE/BANDAGES/DRESSINGS) ×1 IMPLANT
PATTIES SURGICAL .5 X1 (DISPOSABLE) IMPLANT
PATTIES SURGICAL 1X1 (DISPOSABLE) ×1 IMPLANT
PUTTY DBM 10CC CALC GRAN (Putty) ×2 IMPLANT
ROD CURVED TI 6.35X75 (Rod) ×2 IMPLANT
SCREW PA DLX CREO 7.5X45 (Screw) ×6 IMPLANT
SPACER ALTERA 10X31-15 (Spacer) ×1 IMPLANT
SPONGE LAP 4X18 RFD (DISPOSABLE) IMPLANT
SPONGE NEURO XRAY DETECT 1X3 (DISPOSABLE) ×1 IMPLANT
SPONGE SURGIFOAM ABS GEL 100 (HEMOSTASIS) IMPLANT
STRIP CLOSURE SKIN 1/2X4 (GAUZE/BANDAGES/DRESSINGS) ×2 IMPLANT
SUT VIC AB 1 CT1 18XBRD ANBCTR (SUTURE) ×2 IMPLANT
SUT VIC AB 1 CT1 8-18 (SUTURE) ×4
SUT VIC AB 2-0 CP2 18 (SUTURE) ×4 IMPLANT
SYR 20ML LL LF (SYRINGE) ×1 IMPLANT
TOWEL GREEN STERILE (TOWEL DISPOSABLE) ×2 IMPLANT
TOWEL GREEN STERILE FF (TOWEL DISPOSABLE) ×2 IMPLANT
TRAY FOL W/BAG SLVR 16FR STRL (SET/KITS/TRAYS/PACK) IMPLANT
TRAY FOLEY MTR SLVR 16FR STAT (SET/KITS/TRAYS/PACK) ×1 IMPLANT
TRAY FOLEY W/BAG SLVR 16FR LF (SET/KITS/TRAYS/PACK) ×2
WATER STERILE IRR 1000ML POUR (IV SOLUTION) ×2 IMPLANT

## 2020-02-12 NOTE — Progress Notes (Signed)
Dr. Bradley Ferris and Marena Chancy made aware that patient has on insulin pump.  Pump administers 2 units of novolog qH, rate cannot be adjusted.  Pump to stay in place.

## 2020-02-12 NOTE — Transfer of Care (Signed)
Immediate Anesthesia Transfer of Care Note  Patient: Alexis Choi  Procedure(s) Performed: POSTERIOR LUMBAR INTERBODY FUSION, INTERBODY PROSTHESIS, POSTERIOR INSTRUMENTATION LUMBAR THREE- LUMBAR FOUR, LUMBAR FOUR- LUMBAR FIVE (N/A )  Patient Location: PACU  Anesthesia Type:General  Level of Consciousness: drowsy  Airway & Oxygen Therapy: Patient Spontanous Breathing and Patient connected to nasal cannula oxygen  Post-op Assessment: Report given to RN and Post -op Vital signs reviewed and stable  Post vital signs: Reviewed and stable  Last Vitals:  Vitals Value Taken Time  BP 166/71 02/12/20 1310  Temp 36.8 C 02/12/20 1310  Pulse 101 02/12/20 1314  Resp 15 02/12/20 1314  SpO2 97 % 02/12/20 1314  Vitals shown include unvalidated device data.  Last Pain:  Vitals:   02/12/20 0606  TempSrc:   PainSc: 3       Patients Stated Pain Goal: 2 (02/12/20 0606)  Complications: No apparent anesthesia complications

## 2020-02-12 NOTE — Progress Notes (Signed)
Inpatient Diabetes Program Recommendations  AACE/ADA: New Consensus Statement on Inpatient Glycemic Control (2015)  Target Ranges:  Prepandial:   less than 140 mg/dL      Peak postprandial:   less than 180 mg/dL (1-2 hours)      Critically ill patients:  140 - 180 mg/dL   Lab Results  Component Value Date   GLUCAP 146 (H) 02/12/2020   HGBA1C 7.5 (H) 02/10/2020    Review of Glycemic Control  Diabetes history: DM 2 Outpatient Diabetes medications: V-Go 40, Lantus 60 units Daily, Semaglutide 1 mg QSunday Current orders for Inpatient glycemic control: In OR  Inpatient Diabetes Program Recommendations:    Order insulin pump order set. Resume 1/2 of pt's Lantus (lantus 30 units Daily)  We will utilize the hospital glucometer for glucose checks on the floor.  Thanks,  Christena Deem RN, MSN, BC-ADM Inpatient Diabetes Coordinator Team Pager [phone] (8a-5p)

## 2020-02-12 NOTE — H&P (Signed)
Subjective: The patient is a 64 year old white female who has complained of back and right greater left leg pain consistent with neurogenic claudication.  She has failed medical management.  She was worked up with lumbar x-rays and a lumbar MRI which demonstrated L3-4 and L4-5 spondylolisthesis and spinal stenosis.  I discussed the various treatment options with her.  She has decided to proceed with surgery.  Past Medical History:  Diagnosis Date  . Chronic back pain   . Diabetes mellitus   . Dysrhythmia    pat  . Edema    lower extremity  . Encounter for long-term (current) use of other medications   . Family history of adverse reaction to anesthesia    mother slow to wake  . Fluid retention   . GERD (gastroesophageal reflux disease)   . Hyperlipidemia    no meds  . Hypertension   . IBS (irritable bowel syndrome)   . Osteoarthritis of knee   . Pneumonia    several  . PONV (postoperative nausea and vomiting)    occ  . Unspecified vitamin D deficiency     Past Surgical History:  Procedure Laterality Date  . CHOLECYSTECTOMY N/A 06/30/2016   Procedure: LAPAROSCOPIC CHOLECYSTECTOMY;  Surgeon: De Blanch Kinsinger, MD;  Location: [facility] ORS;  Service: General;  Laterality: N/A;  . COLONOSCOPY  10/2020  . KNEE ARTHROSCOPY Left 06  . knee replacment Right   . right knee arthroscopy    . TONSILLECTOMY  63  . TOTAL KNEE ARTHROPLASTY Left 06/03/2013   Procedure: LEFT TOTAL KNEE ARTHROPLASTY;  Surgeon: Dannielle Huh, MD;  Location: [facility] OR;  Service: Orthopedics;  Laterality: Left;  . TUBAL LIGATION  89  . UPPER GI ENDOSCOPY      Allergies  Allergen Reactions  . Latex Itching and Rash    Itch,rash with bandaides  . Eggs Or Egg-Derived Products Nausea And Vomiting  . Januvia [Sitagliptin] Palpitations and Rash  . Metformin And Related Itching, Palpitations and Rash    Social History   Tobacco Use  . Smoking status: Never Smoker  . Smokeless tobacco: Never Used  Substance Use Topics   . Alcohol use: No    History reviewed. No pertinent family history. Prior to Admission medications   Medication Sig Start Date End Date Taking? Authorizing Provider  acetaminophen (TYLENOL ARTHRITIS PAIN) 650 MG CR tablet Take 1,300 mg by mouth in the morning and at bedtime.    Yes [provider]  aspirin 81 MG tablet Take 81 mg by mouth daily.   Yes [provider]  b complex vitamins capsule Take 1 capsule by mouth daily.   Yes [provider]  celecoxib (CELEBREX) 200 MG capsule Take 200 mg by mouth 2 (two) times daily.   Yes [provider]  Cholecalciferol 25 MCG (1000 UT) tablet Take 1,000 Units by mouth 2 (two) times daily.    Yes [provider]  cyclobenzaprine (FLEXERIL) 10 MG tablet Take 5 mg by mouth every 8 (eight) hours as needed (Back Spams).  05/26/16  Yes [provider]  dicyclomine (BENTYL) 10 MG capsule Take 10 mg by mouth 2 (two) times daily.   Yes [provider]  hyoscyamine (LEVSIN SL) 0.125 MG SL tablet Place 0.125 mg under the tongue daily as needed for cramping.  03/24/16  Yes [provider]  insulin aspart (NOVOLOG) 100 UNIT/ML injection Inject 76 Units into the skin continuous. VGO insulin pump   Yes [provider]  insulin glargine (LANTUS)  100 UNIT/ML injection Inject 60 Units into the skin daily.    Yes [provider]  Insulin Pen Needle (PEN NEEDLES 31GX5/16") 31G X 8 MM MISC by Does not apply route.   Yes [provider]  Insulin Syringe-Needle U-100 (ACCUSURE INS SYR 1CC/31GX5/16") 31G X 5/16" 1 ML MISC by Does not apply route.   Yes [provider]  Magnesium Gluconate 250 MG TABS Take 250 mg by mouth daily.    Yes [provider]  Multiple Vitamin (MULTIVITAMIN) capsule Take 1 capsule by mouth daily.   Yes [provider]  omeprazole (PRILOSEC OTC) 20 MG tablet Take 20 mg by mouth 2 (two) times daily.    Yes [provider]   Semaglutide, 1 MG/DOSE, (OZEMPIC, 1 MG/DOSE,) 2 MG/1.5ML SOPN Inject 1 mg into the skin once a week. Takes on Sunday   Yes [provider]  sodium chloride (MURO 128) 5 % ophthalmic ointment Place 1 application into both eyes at bedtime.   Yes [provider]  telmisartan-hydrochlorothiazide (MICARDIS HCT) 80-12.5 MG tablet Take 1 tablet by mouth daily.   Yes [provider]  verapamil (CALAN-SR) 120 MG CR tablet Take 120 mg by mouth daily.    Yes [provider]  budesonide-formoterol (SYMBICORT) 160-4.5 MCG/ACT inhaler Inhale 2 puffs into the lungs 2 (two) times daily as needed (shortness of breath or wheezing).    [provider]  HYDROcodone-acetaminophen (NORCO/VICODIN) 5-325 MG tablet Take 1-2 tablets by mouth every 6 (six) hours as needed for moderate pain. Patient not taking: Reported on 02/01/2019 06/30/16   Kinsinger, De Blanch, MD  QNASL 80 MCG/ACT AERS Inhale 1 puff into the lungs every 8 (eight) hours as needed (Sinus infection).  05/21/16   [provider]     Review of Systems  Positive ROS: As above  All other systems have been reviewed and were otherwise negative with the exception of those mentioned in the HPI and as above.  Objective: Vital signs in last 24 hours: Temp:  [98.3 F (36.8 C)] 98.3 F (36.8 C) (03/24 0602) Pulse Rate:  [96] 96 (03/24 0602) Resp:  [18] 18 (03/24 0602) BP: (168)/(58) 168/58 (03/24 0602) SpO2:  [96 %] 96 % (03/24 0602) Weight:  [115.7 kg] 115.7 kg (03/24 0602) Estimated body mass index is 49.8 kg/m as calculated from the following:   Height as of this encounter: 5' (1.524 m).   Weight as of this encounter: 115.7 kg.   General Appearance: Alert Head: Normocephalic, without obvious abnormality, atraumatic Eyes: PERRL, conjunctiva/corneas clear, EOM's intact,    Ears: Normal  Throat: Normal  Neck: Supple, Back: unremarkable Lungs: Clear to auscultation bilaterally, respirations  unlabored Heart: Regular rate and rhythm, no murmur, rub or gallop Abdomen: Soft, non-tender Extremities: Extremities normal, atraumatic, no cyanosis or edema Skin: unremarkable  NEUROLOGIC:   Mental status: alert and oriented,Motor Exam - grossly normal Sensory Exam - grossly normal Reflexes:  Coordination - grossly normal Gait - grossly normal Balance - grossly normal Cranial Nerves: I: smell Not tested  II: visual acuity  OS: Normal  OD: Normal   II: visual fields Full to confrontation  II: pupils Equal, round, reactive to light  III,VII: ptosis None  III,IV,VI: extraocular muscles  Full ROM  V: mastication Normal  V: facial light touch sensation  Normal  V,VII: corneal reflex  Present  VII: facial muscle function - upper  Normal  VII: facial muscle function - lower Normal  VIII: hearing Not tested  IX: soft palate elevation  Normal  IX,X: gag reflex Present  XI: trapezius strength  5/5  XI: sternocleidomastoid strength 5/5  XI: neck flexion strength  5/5  XII: tongue strength  Normal    Data Review Lab Results  Component Value Date   WBC 14.8 (H) 02/10/2020   HGB 12.5 02/10/2020   HCT 38.8 02/10/2020   MCV 90.7 02/10/2020   PLT 305 02/10/2020   Lab Results  Component Value Date   NA 136 02/10/2020   K 4.6 02/10/2020   CL 104 02/10/2020   CO2 22 02/10/2020   BUN 18 02/10/2020   CREATININE 0.79 02/10/2020   GLUCOSE 196 (H) 02/10/2020   Lab Results  Component Value Date   INR 0.98 05/27/2013    Assessment/Plan: L3-4 and L4-5 spondylolisthesis, spinal stenosis, lumbago, lumbar radiculopathy, neurogenic claudication: I have discussed the situation with the patient.  I have reviewed her imaging studies with her and pointed out the abnormalities.  We have discussed the various treatment options including surgery.  I have described the surgical treatment option of an L3-4 and L4-5 decompression, instrumentation and fusion.  I have given her a surgical pamphlet.   I have shown her surgical models.  We have discussed the risks, benefits, alternatives, expected postoperative course, and likelihood of achieving our goals with surgery.  I have answered all her questions.  She has decided to proceed with surgery.   Ophelia Charter 02/12/2020 7:04 AM

## 2020-02-12 NOTE — Anesthesia Postprocedure Evaluation (Signed)
Anesthesia Post Note  Patient: Alexis Choi  Procedure(s) Performed: POSTERIOR LUMBAR INTERBODY FUSION, INTERBODY PROSTHESIS, POSTERIOR INSTRUMENTATION LUMBAR THREE- LUMBAR FOUR, LUMBAR FOUR- LUMBAR FIVE (N/A )     Patient location during evaluation: PACU Anesthesia Type: General Level of consciousness: awake and alert Pain management: pain level controlled Vital Signs Assessment: post-procedure vital signs reviewed and stable Respiratory status: spontaneous breathing, nonlabored ventilation, respiratory function stable and patient connected to nasal cannula oxygen Cardiovascular status: blood pressure returned to baseline and stable Postop Assessment: no apparent nausea or vomiting Anesthetic complications: no    Last Vitals:  Vitals:   02/12/20 1529 02/12/20 1937  BP: (!) 156/68 (!) 168/70  Pulse: 95 94  Resp: 18 18  Temp: 36.9 C 36.8 C  SpO2: 94% 100%    Last Pain:  Vitals:   02/12/20 2057  TempSrc:   PainSc: 7                  Rosemary Mossbarger P Nakyia Dau

## 2020-02-12 NOTE — Progress Notes (Signed)
Providing Compassionate, Quality Care - Together   Subjective: Patient reports no pain, numbness, or tingling at present.  Objective: Vital signs in last 24 hours: Temp:  [98.2 F (36.8 C)-98.3 F (36.8 C)] 98.2 F (36.8 C) (03/24 1310) Pulse Rate:  [96-103] 103 (03/24 1310) Resp:  [12-18] 12 (03/24 1310) BP: (166-168)/(58-71) 166/71 (03/24 1310) SpO2:  [96 %] 96 % (03/24 1310) Arterial Line BP: (151)/(72) 151/72 (03/24 1310) Weight:  [115.7 kg] 115.7 kg (03/24 0602)  Intake/Output from previous day: No intake/output data recorded. Intake/Output this shift: Total I/O In: 2600 [I.V.:2100; Blood:150; IV Piggyback:350] Out: 975 [Urine:425; Blood:550]  Drowsy, oriented x 4 PERRLA CN II-XII grossly intact MAE, Strength and sensation intact Incision is covered with Honeycomb dressing and Steri Strips; Dressing is clean, dry, and intact   Lab Results: Recent Labs    02/10/20 1310  WBC 14.8*  HGB 12.5  HCT 38.8  PLT 305   BMET Recent Labs    02/10/20 1310  NA 136  K 4.6  CL 104  CO2 22  GLUCOSE 196*  BUN 18  CREATININE 0.79  CALCIUM 9.4    Studies/Results: DG Lumbar Spine 2-3 Views  Result Date: 02/12/2020 CLINICAL DATA:  L3-L5 lumbar fusion EXAM: LUMBAR SPINE - 2-3 VIEW; DG C-ARM 1-60 MIN COMPARISON:  Intraoperative film from earlier in the same day. FLUOROSCOPY TIME:  Fluoroscopy Time:  18 seconds Radiation Exposure Index (if provided by the fluoroscopic device): Not available Number of Acquired Spot Images: 2 FINDINGS: Interbody fusion is noted at L3-4 and L4-5. Pedicle screws are noted at all 3 levels although the posterior fixation elements have not been placed. IMPRESSION: L3-L5 fusion. Electronically Signed   By: Inez Catalina M.D.   On: 02/12/2020 12:52   DG Spine Portable 1 View  Result Date: 02/12/2020 CLINICAL DATA:  L3-5 PLIF EXAM: PORTABLE SPINE - 1 VIEW COMPARISON:  12/26/2019 outside lumbar spine radiographs and 11/22/2017 lumbar spine MRI.  FINDINGS: Posterior approach surgical marking device terminates posterior to the L5-S1 disc level using the same level notation as on 11/22/2017 MRI. Severe degenerative disc disease at L5-S1. IMPRESSION: Posterior approach surgical marking device terminates posterior to the L5-S1 disc level using the same level notation as on 11/22/2017 MRI. These results were called by telephone at the time of interpretation on 02/12/2020 at 9:19 am to provider DR. JEFFREY JENKINS in the OR, who verbally acknowledged these results. Electronically Signed   By: Ilona Sorrel M.D.   On: 02/12/2020 09:22   DG C-Arm 1-60 Min  Result Date: 02/12/2020 CLINICAL DATA:  L3-L5 lumbar fusion EXAM: LUMBAR SPINE - 2-3 VIEW; DG C-ARM 1-60 MIN COMPARISON:  Intraoperative film from earlier in the same day. FLUOROSCOPY TIME:  Fluoroscopy Time:  18 seconds Radiation Exposure Index (if provided by the fluoroscopic device): Not available Number of Acquired Spot Images: 2 FINDINGS: Interbody fusion is noted at L3-4 and L4-5. Pedicle screws are noted at all 3 levels although the posterior fixation elements have not been placed. IMPRESSION: L3-L5 fusion. Electronically Signed   By: Inez Catalina M.D.   On: 02/12/2020 12:52    Assessment/Plan: Patient underwent L3-4, L4-5 decompression and fusion by Dr. Arnoldo Morale. She is moving her extremities well. Strength and sensation intact. She will be admitted to [facility], where she will be mobilized with therapies.   LOS: 0 days    Viona Gilmore, DNP, AGNP-C Nurse Practitioner  [facility] [place] [address], [address], [place], [place] [address] P:  [phone]    F: [phone]  02/12/2020, 1:33 PM

## 2020-02-12 NOTE — Anesthesia Procedure Notes (Signed)
Arterial Line Insertion Start/End3/24/2021 6:55 AM, 02/12/2020 7:00 AM Performed by: Waynard Edwards, CRNA, CRNA  Patient location: Pre-op. Preanesthetic checklist: patient identified, IV checked, site marked, risks and benefits discussed, surgical consent, monitors and equipment checked, pre-op evaluation, timeout performed and anesthesia consent Lidocaine 1% used for infiltration Left, radial was placed Catheter size: 20 G Hand hygiene performed , maximum sterile barriers used  and Seldinger technique used Allen's test indicative of satisfactory collateral circulation Attempts: 1 Procedure performed without using ultrasound guided technique. Following insertion, dressing applied and Biopatch. Post procedure assessment: normal and unchanged  Patient tolerated the procedure well with no immediate complications.

## 2020-02-12 NOTE — Op Note (Signed)
Brief history: The patient is a 64 year old white female who has complained of back and right greater left leg pain consistent with neurogenic claudication.  She has failed medical management and was worked up with a lumbar MRI and lumbar x-rays.  This demonstrated an L3-4 and L4-5 spondylolisthesis, severe facet arthropathy, spinal stenosis, etc.  I discussed the various treatment options with the patient.  The patient has decided to proceed with surgery after weighing the risks, benefits and alternatives.  Preoperative diagnosis: L3-4 and L4-5 spondylolisthesis, degenerative disc disease, spinal stenosis compressing L3, L4 and L5 nerve roots; lumbago; lumbar radiculopathy; neurogenic claudication  Postoperative diagnosis: The same  Procedure: Bilateral L3-4 and L4-5 laminotomy/foraminotomies/medial facetectomy to decompress the bilateral L3, L4 and L5 nerve roots(the work required to do this was in addition to the work required to do the posterior lumbar interbody fusion because of the patient's spinal stenosis, facet arthropathy. Etc. requiring a wide decompression of the nerve roots.);  L3-4 and L4-5 transforaminal lumbar interbody fusion with local morselized autograft bone and Zimmer DBM; insertion of interbody prosthesis at L3-4 and L4-5 (globus peek expandable interbody prosthesis); posterior segmental instrumentation from L3 to L5 with globus titanium pedicle screws and rods; posterior lateral arthrodesis at L3-4 and L4-5 with local morselized autograft bone and Zimmer DBM.  Surgeon: Dr. Earle Gell  Asst.: Arnetha Massy, NP  Anesthesia: Gen. endotracheal  Estimated blood loss: 400 cc  Drains: None  Complications: None  Description of procedure: The patient was brought to the operating room by the anesthesia team. General endotracheal anesthesia was induced. The patient was turned to the prone position on the Wilson frame. The patient's lumbosacral region was then prepared with Betadine  scrub and Betadine solution. Sterile drapes were applied.  I then injected the area to be incised with Marcaine with epinephrine solution. I then used the scalpel to make a linear midline incision over the L3-4 and L4-5 interspace. I then used electrocautery to perform a bilateral subperiosteal dissection exposing the spinous process and lamina of L3-4 and L4-5. We then obtained intraoperative radiograph to confirm our location.  The patient had transitional anatomy so we had the radiologist confirm we are at the correct level.  We then inserted the Verstrac retractor to provide exposure.  I began the decompression by using the high speed drill to perform laminotomies at L3-4 and L4-5 bilaterally. We then used the Kerrison punches to widen the laminotomy and removed the ligamentum flavum at L3-4 and L4-5 bilaterally. We used the Kerrison punches to remove the medial facets at L3-4 and L4-5 bilaterally.  The facets at L3-4 and L4-5 were extremely arthritic and gaped bilaterally.  We removed the facets at L3-4 and L4-5 on the right. We performed wide foraminotomies about the bilateral L3-L4 and L5 nerve roots completing the decompression.  We now turned our attention to the posterior lumbar interbody fusion. I used a scalpel to incise the intervertebral disc at L3-4 and L4-5 bilaterally. I then performed a partial intervertebral discectomy at L3-4 and L4-5 bilaterally using the pituitary forceps. We prepared the vertebral endplates at G3-1 and D1-7 bilaterally for the fusion by removing the soft tissues with the curettes. We then used the trial spacers to pick the appropriate sized interbody prosthesis. We prefilled his prosthesis with a combination of local morselized autograft bone that we obtained during the decompression as well as Zimmer DBM. We inserted the prefilled prosthesis into the interspace at L3-4 and L4-5 from the right, we then turned and expanded  the prosthesis. There was a good snug fit of the  prosthesis in the interspace. We then filled and the remainder of the intervertebral disc space with local morselized autograft bone and Zimmer DBM. This completed the posterior lumbar interbody arthrodesis.  During the decompression and insertion of the prosthesis the assistant protected the thecal sac and nerve roots with the D'Errico retractor.  We now turned attention to the instrumentation. Under fluoroscopic guidance we cannulated the bilateral L3, L4 and L5 pedicles with the bone probe. We then removed the bone probe. We then tapped the pedicle with a 6.5 millimeter tap. We then removed the tap. We probed inside the tapped pedicle with a ball probe to rule out cortical breaches. We then inserted a 7.5 x 45 millimeter pedicle screw into the L3, L4 and L5 pedicles bilaterally under fluoroscopic guidance. We then palpated along the medial aspect of the pedicles to rule out cortical breaches. There were none. The nerve roots were not injured. We then connected the unilateral pedicle screws with a lordotic rod. We compressed the construct and secured the rod in place with the caps.  We placed a cross connector between the rods.  We then tightened the caps appropriately. This completed the instrumentation from L3-L5 bilaterally.  We now turned our attention to the posterior lateral arthrodesis at L3-4 and L4-5 bilaterally. We used the high-speed drill to decorticate the remainder of the facets, pars, transverse process at L3-4 and L4-5 bilaterally. We then applied a combination of local morselized autograft bone and Zimmer DBM over these decorticated posterior lateral structures. This completed the posterior lateral arthrodesis.  We then obtained hemostasis using bipolar electrocautery. We irrigated the wound out with bacitracin solution. We inspected the thecal sac and nerve roots and noted they were well decompressed. We then removed the retractor.  We injected Exparel . We reapproximated patient's  thoracolumbar fascia with interrupted #1 Vicryl suture. We reapproximated patient's subcutaneous tissue with interrupted 2-0 Vicryl suture. The reapproximated patient's skin with Steri-Strips and benzoin. The wound was then coated with bacitracin ointment. A sterile dressing was applied. The drapes were removed. The patient was subsequently returned to the supine position where they were extubated by the anesthesia team. He was then transported to the post anesthesia care unit in stable condition. All sponge instrument and needle counts were reportedly correct at the end of this case.

## 2020-02-12 NOTE — Progress Notes (Signed)
Patient's gold wedding band set given to husband, Annette Stable.

## 2020-02-12 NOTE — Anesthesia Procedure Notes (Signed)
Procedure Name: Intubation Date/Time: 02/12/2020 7:41 AM Performed by: Waynard Edwards, CRNA Pre-anesthesia Checklist: Patient identified, Emergency Drugs available, Patient being monitored and Suction available Patient Re-evaluated:Patient Re-evaluated prior to induction Oxygen Delivery Method: Circle system utilized Preoxygenation: Pre-oxygenation with 100% oxygen Induction Type: IV induction Ventilation: Mask ventilation without difficulty Laryngoscope Size: Miller and 2 Grade View: Grade II Tube type: Oral Tube size: 7.0 mm Number of attempts: 2 Airway Equipment and Method: Stylet and Oral airway Placement Confirmation: ETT inserted through vocal cords under direct vision,  positive ETCO2 and breath sounds checked- equal and bilateral Secured at: 21 cm Tube secured with: Tape Dental Injury: Teeth and Oropharynx as per pre-operative assessment  Comments: DL x 1 with Mil 2.  Grade 2 view.  Esophageal intubation.  DL x2 with Mil 2 and grade 2 view.  EBBS and VSS.

## 2020-02-13 LAB — GLUCOSE, CAPILLARY
Glucose-Capillary: 154 mg/dL — ABNORMAL HIGH (ref 70–99)
Glucose-Capillary: 140 mg/dL — ABNORMAL HIGH (ref 70–99)

## 2020-02-13 LAB — BASIC METABOLIC PANEL
Anion gap: 10 (ref 5–15)
BUN: 13 mg/dL (ref 8–23)
CO2: 26 mmol/L (ref 22–32)
Calcium: 8.5 mg/dL — ABNORMAL LOW (ref 8.9–10.3)
Chloride: 100 mmol/L (ref 98–111)
Creatinine, Ser: 0.88 mg/dL (ref 0.44–1.00)
GFR calc Af Amer: >60 mL/min (ref >60)
GFR calc non Af Amer: >60 mL/min (ref >60)
Glucose, Bld: 153 mg/dL — ABNORMAL HIGH (ref 70–99)
Potassium: 4.3 mmol/L (ref 3.5–5.1)
Sodium: 136 mmol/L (ref 135–145)

## 2020-02-13 LAB — CBC
HCT: 29.8 % — ABNORMAL LOW (ref 36.0–46.0)
Hemoglobin: 9.6 g/dL — ABNORMAL LOW (ref 12.0–15.0)
MCH: 29.4 pg (ref 26.0–34.0)
MCHC: 32.2 g/dL (ref 30.0–36.0)
MCV: 91.1 fL (ref 80.0–100.0)
Platelets: 261 10*3/uL (ref 150–400)
RBC: 3.27 MIL/uL — ABNORMAL LOW (ref 3.87–5.11)
RDW: 14.2 % (ref 11.5–15.5)
WBC: 17.4 10*3/uL — ABNORMAL HIGH (ref 4.0–10.5)
nRBC: 0 % (ref 0.0–0.2)

## 2020-02-13 MED ORDER — OXYCODONE-ACETAMINOPHEN 5-325 MG PO TABS: 1.0000 | ORAL_TABLET | ORAL | 0 refills | Status: DC | PRN

## 2020-02-13 MED ORDER — DOCUSATE SODIUM 100 MG PO CAPS: 100.0000 mg | ORAL_CAPSULE | Freq: Two times a day (BID) | ORAL | 0 refills | Status: DC

## 2020-02-13 MED ORDER — OXYCODONE-ACETAMINOPHEN 5-325 MG PO TABS: 1.0000 | ORAL_TABLET | ORAL | Status: DC | PRN

## 2020-02-13 MED ORDER — CYCLOBENZAPRINE HCL 10 MG PO TABS: 10.0000 mg | ORAL_TABLET | Freq: Three times a day (TID) | ORAL | 1 refills | Status: DC | PRN

## 2020-02-13 MED FILL — Heparin Sodium (Porcine) Inj 1000 Unit/ML: INTRAMUSCULAR | Qty: 30 | Status: AC

## 2020-02-13 MED FILL — Sodium Chloride IV Soln 0.9%: INTRAVENOUS | Qty: 1000 | Status: AC

## 2020-02-13 MED FILL — Sodium Chloride Irrigation Soln 0.9%: Qty: 3000 | Status: AC

## 2020-02-13 MED FILL — Thrombin For Soln 5000 Unit: CUTANEOUS | Qty: 5000 | Status: AC

## 2020-02-13 NOTE — Discharge Instructions (Signed)

## 2020-02-13 NOTE — TOC Transition Note (Signed)
Transition of Care [facility]) - CM/SW Discharge Note   Patient Details  Name: Alexis Choi MRN: [id number] Date of Birth: September 24, 1956  Transition of Care [facility]) CM/SW Contact:  Kermit Balo, RN Phone Number: 02/13/2020, 10:40 AM   Clinical Narrative:    Pt discharging home with [facility] services through [place]. Cory with [facility] accepted the referral.  Pt has transportation home.    Final next level of care: Home w Home Health Services Barriers to Discharge: No Barriers Identified   Patient Goals and CMS Choice   CMS Medicare.gov Compare Post Acute Care list provided to:: Patient Choice offered to / list presented to : Patient  Discharge Placement                       Discharge Plan and Services                          HH Arranged: PT [facility] Agency: [facility] Care Date [facility] Agency Contacted: 02/13/20   Representative spoke with at [facility] Agency: Kandee Keen  Social Determinants of Health (SDOH) Interventions     Readmission Risk Interventions No flowsheet data found.

## 2020-02-13 NOTE — Progress Notes (Signed)
Patient is discharged from room 3C04 at this time. Alert and in stable condition. IV site d/c'd and instructions read to patient and spouse with understanding verbalized. Left unit via wheelchair with all belongings at side. 

## 2020-02-13 NOTE — Evaluation (Signed)
Physical Therapy Evaluation Patient Details Name: Alexis Choi MRN: [id number] DOB: 1956-07-01 Today's Date: 02/13/2020   History of Present Illness  64 y/o female s/p L3-5 PLIF. PMH includes L TKA, HTN, DM2, obesity  Clinical Impression  Pt presents with LE functional weakness, mild unsteadiness in standing, decreased knowledge and application of spinal precautions, increased time and effort to mobilize putting pt at increased risk of falls. Pt to benefit from acute PT to address deficits. Pt ambulated hallway distance requiring RW for steadying, otherwise pt reaching for environment in forward flexed posture. PT recommending HHPT to address deficits post-acutely. PT to progress mobility as tolerated, and will continue to follow acutely.      Follow Up Recommendations Home health PT;Supervision for mobility/OOB    Equipment Recommendations  Rolling walker with 5" wheels    Recommendations for Other Services       Precautions / Restrictions Precautions Precautions: Fall;Back Precaution Booklet Issued: Yes (comment) Precaution Comments: pt verbalizes no bending and twisting, requires cuing for no lifting or arching. Pt educated and instructed in log roll technique for in and out of bed Required Braces or Orthoses: Spinal Brace Spinal Brace: Lumbar corset;Applied in sitting position Restrictions Weight Bearing Restrictions: No      Mobility  Bed Mobility Overal bed mobility: Needs Assistance Bed Mobility: Rolling;Sidelying to Sit Rolling: Min guard Sidelying to sit: Min assist;HOB elevated   Sit to supine: Supervision   General bed mobility comments: min guard for rolling with increased time and use of bedrail, min assist for righting trunk when transitioning from sidelying to sit. Very increased time to scoot to EOB.  Transfers Overall transfer level: Needs assistance Equipment used: None Transfers: Sit to/from Stand Sit to Stand: Min guard         General  transfer comment: min guard for safety, increased time to rise and steady and reaching for environment for self-steadying.  Ambulation/Gait Ambulation/Gait assistance: Min guard Gait Distance (Feet): 150 Feet Assistive device: Rolling walker (2 wheeled) Gait Pattern/deviations: Step-through pattern;Decreased stride length;Trunk flexed Gait velocity: decr   General Gait Details: Min guard for safety, verbal cuing for upright posture, placement in RW x2.  Stairs Stairs: Yes Stairs assistance: Min guard Stair Management: One rail Left;Sideways;Step to pattern Number of Stairs: 5 General stair comments: min guard for safety, attempted to climb steps forward but unsteady and reaching for opposite railing. Pt instructed in navigating steps sideways with use of bilateral UEs on railing, caregiver guarding when ascending/descending.  Wheelchair Mobility    Modified Rankin (Stroke Patients Only)       Balance Overall balance assessment: Needs assistance Sitting-balance support: No upper extremity supported;Feet supported Sitting balance-Leahy Scale: Good     Standing balance support: No upper extremity supported;During functional activity Standing balance-Leahy Scale: Fair Standing balance comment: reaching for environment without UE support                             Pertinent Vitals/Pain Pain Assessment: 0-10 Pain Score: 7  Faces Pain Scale: Hurts little more Pain Location: lumbar incisional site Pain Descriptors / Indicators: Aching;Sore Pain Intervention(s): Limited activity within patient's tolerance;Monitored during session;Premedicated before session;Repositioned    Home Living Family/patient expects to be discharged to:: Private residence Living Arrangements: Spouse/significant other Available Help at Discharge: Family;Available 24 hours/day Type of Home: House Home Access: Stairs to enter   [employer] of Steps: 5 Home Layout: One  level Home Equipment: Walker - 2  wheels;Walker - 4 wheels;Bedside commode;Electric scooter;Adaptive equipment      Prior Function Level of Independence: Independent with assistive device(s)         Comments: using cane and having outside assist for IADLs     Hand Dominance   Dominant Hand: Right    Extremity/Trunk Assessment   Upper Extremity Assessment Upper Extremity Assessment: Defer to OT evaluation    Lower Extremity Assessment Lower Extremity Assessment: Generalized weakness    Cervical / Trunk Assessment Cervical / Trunk Assessment: Other exceptions Cervical / Trunk Exceptions: s/p lumbar fusion  Communication   Communication: No difficulties  Cognition Arousal/Alertness: Awake/alert Behavior During Therapy: WFL for tasks assessed/performed Overall Cognitive Status: Within Functional Limits for tasks assessed                                        General Comments      Exercises Other Exercises Other Exercises: home walking program: discussed up and walking 5-10 minutes at a time with supervision, once every hour for circulation and prevention of spinal stiffness/poor posture.   Assessment/Plan    PT Assessment Patient needs continued PT services  PT Problem List Decreased strength;Decreased mobility;Decreased safety awareness;Decreased activity tolerance;Decreased balance;Decreased knowledge of use of DME;Pain;Obesity;Decreased knowledge of precautions       PT Treatment Interventions DME instruction;Therapeutic activities;Gait training;Therapeutic exercise;Patient/family education;Balance training;Stair training;Neuromuscular re-education;Functional mobility training    PT Goals (Current goals can be found in the Care Plan section)  Acute Rehab PT Goals Patient Stated Goal: return to independence PT Goal Formulation: With patient Time For Goal Achievement: 02/20/20 Potential to Achieve Goals: Good    Frequency Min 5X/week    Barriers to discharge        Co-evaluation               AM-PAC PT "6 Clicks" Mobility  Outcome Measure Help needed turning from your back to your side while in a flat bed without using bedrails?: A Little Help needed moving from lying on your back to sitting on the side of a flat bed without using bedrails?: A Little Help needed moving to and from a bed to a chair (including a wheelchair)?: A Little Help needed standing up from a chair using your arms (Choi.g., wheelchair or bedside chair)?: A Little Help needed to walk in hospital room?: A Little Help needed climbing 3-5 steps with a railing? : A Little 6 Click Score: 18    End of Session Equipment Utilized During Treatment: Back brace Activity Tolerance: Patient tolerated treatment well;Patient limited by pain Patient left: in bed;with call bell/phone within reach;Other (comment)(sitting EOB, OT arriving to room) Nurse Communication: Mobility status PT Visit Diagnosis: Other abnormalities of gait and mobility (R26.89);Muscle weakness (generalized) (M62.81)    Time: 2423-5361 PT Time Calculation (min) (ACUTE ONLY): 21 min   Charges:   PT Evaluation $PT Eval Low Complexity: 1 Low          Alexis Choi, PT Acute Rehabilitation Services Pager [phone]  Office [phone]   Alexis Choi 02/13/2020, 11:02 AM

## 2020-02-13 NOTE — Discharge Summary (Signed)
Physician Discharge Summary  Patient ID: QUINLEE SCIARRA MRN: [id number] DOB/AGE: 1956/09/09 64 y.o.  Admit date: 02/12/2020 Discharge date: 02/13/2020  Admission Diagnoses: Lumbar spondylolisthesis, lumbar spinal stenosis, lumbago, lumbar radiculopathy, neurogenic claudication  Discharge Diagnoses: The same Active Problems:   Spondylolisthesis of lumbar region   Discharged Condition: good  Hospital Course: I performed an L3-4 and L4-5 decompression, instrumentation and fusion on the patient on 02/12/2020.  The surgery went well.  The patient's postoperative course was unremarkable.  On postoperative day #1 she requested discharge to home.  She was given written and oral discharge instructions.  All her questions were answered.  Arrangements were made for home physical therapy.  Consults: PT, OT, care management Significant Diagnostic Studies: None Treatments: L3-4 and L4-5 decompression, instrumentation and fusion. Discharge Exam: Blood pressure (!) 125/44, pulse 92, temperature 98.5 F (36.9 C), temperature source Oral, resp. rate 16, height 5' (1.524 m), weight 115.7 kg, SpO2 94 %. The patient is alert and pleasant.  She looks well.  Her dressing is clean and dry.  Her lower extremity strength is normal.  Disposition: Home  Discharge Instructions    Call MD for:  difficulty breathing, headache or visual disturbances   Complete by: As directed    Call MD for:  extreme fatigue   Complete by: As directed    Call MD for:  hives   Complete by: As directed    Call MD for:  persistant dizziness or light-headedness   Complete by: As directed    Call MD for:  persistant nausea and vomiting   Complete by: As directed    Call MD for:  redness, tenderness, or signs of infection (pain, swelling, redness, odor or green/yellow discharge around incision site)   Complete by: As directed    Call MD for:  severe uncontrolled pain   Complete by: As directed    Call MD for:  temperature  >100.4   Complete by: As directed    Diet - low sodium heart healthy   Complete by: As directed    Discharge instructions   Complete by: As directed    Call [phone] for a followup appointment. Take a stool softener while you are using pain medications.   Driving Restrictions   Complete by: As directed    Do not drive for 2 weeks.   Increase activity slowly   Complete by: As directed    Lifting restrictions   Complete by: As directed    Do not lift more than 5 pounds. No excessive bending or twisting.   May shower / Bathe   Complete by: As directed    Remove the dressing for 3 days after surgery.  You may shower, but leave the incision alone.   Remove dressing in 48 hours   Complete by: As directed    Your stitches are under the scan and will dissolve by themselves. The Steri-Strips will fall off after you take a few showers. Do not rub back or pick at the wound, Leave the wound alone.     Allergies as of 02/13/2020      Reactions   Latex Itching, Rash   Itch,rash with bandaides   Eggs Or Egg-derived Products Nausea And Vomiting   Januvia [sitagliptin] Palpitations, Rash   Metformin And Related Itching, Palpitations, Rash      Medication List    STOP taking these medications   HYDROcodone-acetaminophen 5-325 MG tablet Commonly known as: NORCO/VICODIN   Tylenol Arthritis Pain 650 MG CR tablet  Generic drug: acetaminophen     TAKE these medications   ACCUSURE INS SYR 1CC/31GX5/16" 31G X 5/16" 1 ML Misc Generic drug: Insulin Syringe-Needle U-100 by Does not apply route.   aspirin 81 MG tablet Take 81 mg by mouth daily.   b complex vitamins capsule Take 1 capsule by mouth daily.   budesonide-formoterol 160-4.5 MCG/ACT inhaler Commonly known as: SYMBICORT Inhale 2 puffs into the lungs 2 (two) times daily as needed (shortness of breath or wheezing).   celecoxib 200 MG capsule Commonly known as: CELEBREX Take 200 mg by mouth 2 (two) times daily.    Cholecalciferol 25 MCG (1000 UT) tablet Take 1,000 Units by mouth 2 (two) times daily.   cyclobenzaprine 10 MG tablet Commonly known as: FLEXERIL Take 1 tablet (10 mg total) by mouth 3 (three) times daily as needed for muscle spasms. What changed:   how much to take  when to take this  reasons to take this   dicyclomine 10 MG capsule Commonly known as: BENTYL Take 10 mg by mouth 2 (two) times daily.   docusate sodium 100 MG capsule Commonly known as: COLACE Take 1 capsule (100 mg total) by mouth 2 (two) times daily.   hyoscyamine 0.125 MG SL tablet Commonly known as: LEVSIN SL Place 0.125 mg under the tongue daily as needed for cramping.   insulin aspart 100 UNIT/ML injection Commonly known as: novoLOG Inject 76 Units into the skin continuous. VGO insulin pump   insulin glargine 100 UNIT/ML injection Commonly known as: LANTUS Inject 60 Units into the skin daily.   Magnesium Gluconate 250 MG Tabs Take 250 mg by mouth daily.   multivitamin capsule Take 1 capsule by mouth daily.   Muro 128 5 % ophthalmic ointment Generic drug: sodium chloride Place 1 application into both eyes at bedtime.   omeprazole 20 MG tablet Commonly known as: PRILOSEC OTC Take 20 mg by mouth 2 (two) times daily.   oxyCODONE-acetaminophen 5-325 MG tablet Commonly known as: PERCOCET/ROXICET Take 1-2 tablets by mouth every 4 (four) hours as needed for moderate pain.   Ozempic (1 MG/DOSE) 2 MG/1.5ML Sopn Generic drug: Semaglutide (1 MG/DOSE) Inject 1 mg into the skin once a week. Takes on Sunday   PEN NEEDLES 31GX5/16" 31G X 8 MM Misc by Does not apply route.   Qnasl 80 MCG/ACT Aers Generic drug: Beclomethasone Dipropionate Inhale 1 puff into the lungs every 8 (eight) hours as needed (Sinus infection).   telmisartan-hydrochlorothiazide 80-12.5 MG tablet Commonly known as: MICARDIS HCT Take 1 tablet by mouth daily.   verapamil 120 MG CR tablet Commonly known as: CALAN-SR Take 120  mg by mouth daily.        Signed: Cristi Loron 02/13/2020, 10:01 AM

## 2020-02-13 NOTE — Evaluation (Signed)
Occupational Therapy Evaluation Patient Details Name: Alexis Choi MRN: [id number] DOB: 04-06-1956 Today's Date: 02/13/2020    History of Present Illness 64 y/o female s/p L3-5 PLIF. PMH includes L TKA, HTN, DM2   Clinical Impression   PTA pt living at home with spouse, using cane for mobility and having IADL assist. At time of eval, pt completing bed mobility and sit <> stands at supervision level with RW. Pt is proficient with use of LB AE at baseline, reviewed with new precautions. Educated pt on use of toilet aide and acquiring home for increased independence at home. Back handout provided and reviewed adls in detail. Pt educated on: clothing between brace, never sleep in brace, set an alarm at night for medication, avoid sitting for long periods of time, correct bed positioning for sleeping, correct sequence for bed mobility, avoiding lifting more than 5 pounds and never wash directly over incision. All education is complete and patient indicates understanding. No further OT needs identified, OT will sign off. Thank you for this consult.    Follow Up Recommendations  No OT follow up    Equipment Recommendations  Other (comment)(pt to acquire toilet aide)    Recommendations for Other Services       Precautions / Restrictions Precautions Precautions: Fall;Back Precaution Booklet Issued: Yes (comment) Precaution Comments: reviewed in context of BADL Required Braces or Orthoses: Spinal Brace Spinal Brace: Lumbar corset Restrictions Weight Bearing Restrictions: No      Mobility Bed Mobility Overal bed mobility: Needs Assistance Bed Mobility: Sit to Supine       Sit to supine: Supervision   General bed mobility comments: Pt has "own way" of returning to bed. Pt approached bed facing head, placed L knee on middle of bed, kept spine straight and extended hip to place RLE on bed. Pt then performed log roll to place self supine. Educated pt on traditional way to perform bed  mobility, she requires increased assist. Reinforced no arching with this choice of transfer  Transfers Overall transfer level: Needs assistance Equipment used: Rolling walker (2 wheeled) Transfers: Sit to/from Stand Sit to Stand: Supervision              Balance Overall balance assessment: Mild deficits observed, not formally tested                                         ADL either performed or assessed with clinical judgement   ADL Overall ADL's : Modified independent                                       General ADL Comments: Pt very porficient with use of AE. Pt has reacher, long handled sponge, and long handled lotion applicator at home. She also uses a tub bench and a seat to do her grooming. Educated pt on use of toilet aide for peri hygiene with wipes to increase independence further.     Vision Baseline Vision/History: Wears glasses Wears Glasses: At all times Patient Visual Report: No change from baseline       Perception     Praxis      Pertinent Vitals/Pain Pain Assessment: Faces Faces Pain Scale: Hurts little more Pain Location: lumbar incisional site Pain Descriptors / Indicators: Aching Pain Intervention(s): Monitored during session  Hand Dominance     Extremity/Trunk Assessment Upper Extremity Assessment Upper Extremity Assessment: Overall WFL for tasks assessed   Lower Extremity Assessment Lower Extremity Assessment: Defer to PT evaluation       Communication Communication Communication: No difficulties   Cognition Arousal/Alertness: Awake/alert Behavior During Therapy: WFL for tasks assessed/performed Overall Cognitive Status: Within Functional Limits for tasks assessed                                     General Comments       Exercises     Shoulder Instructions      Home Living Family/patient expects to be discharged to:: Private residence Living Arrangements:  Spouse/significant other Available Help at Discharge: Family;Available 24 hours/day Type of Home: House Home Access: Stairs to enter [employer] of Steps: 5   Home Layout: One level     Bathroom Shower/Tub: Chief Strategy Officer: Handicapped height Bathroom Accessibility: Yes How Accessible: Accessible via walker Home Equipment: Walker - 2 wheels;Walker - 4 wheels;Bedside commode;Electric scooter;Adaptive equipment Adaptive Equipment: Reacher        Prior Functioning/Environment Level of Independence: Independent with assistive device(s)        Comments: using cane and having outside assist for IADLs        OT Problem List: Decreased knowledge of use of DME or AE;Decreased knowledge of precautions;Pain      OT Treatment/Interventions:      OT Goals(Current goals can be found in the care plan section) Acute Rehab OT Goals Patient Stated Goal: return to independence OT Goal Formulation: With patient Time For Goal Achievement: 02/27/20 Potential to Achieve Goals: Good  OT Frequency:     Barriers to D/C:            Co-evaluation              AM-PAC OT "6 Clicks" Daily Activity     Outcome Measure Help from another person eating meals?: None Help from another person taking care of personal grooming?: None Help from another person toileting, which includes using toliet, bedpan, or urinal?: None Help from another person bathing (including washing, rinsing, drying)?: None Help from another person to put on and taking off regular upper body clothing?: None Help from another person to put on and taking off regular lower body clothing?: None 6 Click Score: 24   End of Session Equipment Utilized During Treatment: Rolling walker;Back brace Nurse Communication: Mobility status;Precautions  Activity Tolerance: Patient tolerated treatment well Patient left: in bed;with call bell/phone within reach  OT Visit Diagnosis: Unsteadiness on feet  (R26.81);Other abnormalities of gait and mobility (R26.89);Pain Pain - part of body: (back)                Time: 3267-1245 OT Time Calculation (min): 27 min Charges:  OT General Charges $OT Visit: 1 Visit OT Evaluation $OT Eval Low Complexity: 1 Low OT Treatments $Self Care/Home Management : 8-22 mins  Dalphine Handing, MSOT, OTR/L Acute Rehabilitation Services [facility] Office Number: [phone] Pager: [phone]  Dalphine Handing 02/13/2020, 10:15 AM

## 2020-05-05 ENCOUNTER — Inpatient Hospital Stay (HOSPITAL_COMMUNITY)
Admission: AD | Admit: 2020-05-05 | Discharge: 2020-05-09 | DRG: 857 | Disposition: A | Discharge status: Home Health | Payer: BC Managed Care – PPO | Source: Ambulatory Visit | Attending: Neurosurgery | Admitting: Neurosurgery

## 2020-05-05 ENCOUNTER — Encounter (HOSPITAL_COMMUNITY): Payer: Self-pay | Admitting: Neurosurgery

## 2020-05-05 ENCOUNTER — Other Ambulatory Visit: Payer: Self-pay | Admitting: Neurosurgery

## 2020-05-05 DIAGNOSIS — K219 Gastro-esophageal reflux disease without esophagitis: Secondary | ICD-10-CM | POA: Diagnosis present

## 2020-05-05 DIAGNOSIS — Z6841 Body Mass Index (BMI) 40.0 and over, adult: Secondary | ICD-10-CM

## 2020-05-05 DIAGNOSIS — Z91012 Allergy to eggs: Secondary | ICD-10-CM | POA: Diagnosis not present

## 2020-05-05 DIAGNOSIS — G8929 Other chronic pain: Secondary | ICD-10-CM | POA: Diagnosis present

## 2020-05-05 DIAGNOSIS — T8140XA Infection following a procedure, unspecified, initial encounter: Secondary | ICD-10-CM | POA: Diagnosis present

## 2020-05-05 DIAGNOSIS — Z96652 Presence of left artificial knee joint: Secondary | ICD-10-CM | POA: Diagnosis present

## 2020-05-05 DIAGNOSIS — M4646 Discitis, unspecified, lumbar region: Secondary | ICD-10-CM | POA: Diagnosis present

## 2020-05-05 DIAGNOSIS — Z79891 Long term (current) use of opiate analgesic: Secondary | ICD-10-CM

## 2020-05-05 DIAGNOSIS — K589 Irritable bowel syndrome without diarrhea: Secondary | ICD-10-CM | POA: Diagnosis present

## 2020-05-05 DIAGNOSIS — Z981 Arthrodesis status: Secondary | ICD-10-CM

## 2020-05-05 DIAGNOSIS — Z7982 Long term (current) use of aspirin: Secondary | ICD-10-CM

## 2020-05-05 DIAGNOSIS — B9561 Methicillin susceptible Staphylococcus aureus infection as the cause of diseases classified elsewhere: Secondary | ICD-10-CM | POA: Diagnosis present

## 2020-05-05 DIAGNOSIS — M4626 Osteomyelitis of vertebra, lumbar region: Secondary | ICD-10-CM | POA: Diagnosis present

## 2020-05-05 DIAGNOSIS — E785 Hyperlipidemia, unspecified: Secondary | ICD-10-CM | POA: Diagnosis present

## 2020-05-05 DIAGNOSIS — Z794 Long term (current) use of insulin: Secondary | ICD-10-CM

## 2020-05-05 DIAGNOSIS — I10 Essential (primary) hypertension: Secondary | ICD-10-CM | POA: Diagnosis present

## 2020-05-05 DIAGNOSIS — A4901 Methicillin susceptible Staphylococcus aureus infection, unspecified site: Secondary | ICD-10-CM | POA: Diagnosis not present

## 2020-05-05 DIAGNOSIS — Z91040 Latex allergy status: Secondary | ICD-10-CM | POA: Diagnosis not present

## 2020-05-05 DIAGNOSIS — M4316 Spondylolisthesis, lumbar region: Secondary | ICD-10-CM | POA: Diagnosis present

## 2020-05-05 DIAGNOSIS — Z79899 Other long term (current) drug therapy: Secondary | ICD-10-CM

## 2020-05-05 DIAGNOSIS — E119 Type 2 diabetes mellitus without complications: Secondary | ICD-10-CM | POA: Diagnosis not present

## 2020-05-05 DIAGNOSIS — E559 Vitamin D deficiency, unspecified: Secondary | ICD-10-CM | POA: Diagnosis present

## 2020-05-05 DIAGNOSIS — Z888 Allergy status to other drugs, medicaments and biological substances status: Secondary | ICD-10-CM

## 2020-05-05 DIAGNOSIS — M609 Myositis, unspecified: Secondary | ICD-10-CM | POA: Diagnosis present

## 2020-05-05 LAB — CBC
HCT: 29.7 % — ABNORMAL LOW (ref 36.0–46.0)
Hemoglobin: 9 g/dL — ABNORMAL LOW (ref 12.0–15.0)
MCH: 24.5 pg — ABNORMAL LOW (ref 26.0–34.0)
MCHC: 30.3 g/dL (ref 30.0–36.0)
MCV: 80.7 fL (ref 80.0–100.0)
Platelets: 491 10*3/uL — ABNORMAL HIGH (ref 150–400)
RBC: 3.68 MIL/uL — ABNORMAL LOW (ref 3.87–5.11)
RDW: 16.3 % — ABNORMAL HIGH (ref 11.5–15.5)
WBC: 16 10*3/uL — ABNORMAL HIGH (ref 4.0–10.5)
nRBC: 0 % (ref 0.0–0.2)

## 2020-05-05 LAB — BASIC METABOLIC PANEL
Anion gap: 12 (ref 5–15)
BUN: 19 mg/dL (ref 8–23)
CO2: 23 mmol/L (ref 22–32)
Calcium: 8.7 mg/dL — ABNORMAL LOW (ref 8.9–10.3)
Chloride: 101 mmol/L (ref 98–111)
Creatinine, Ser: 0.8 mg/dL (ref 0.44–1.00)
GFR calc Af Amer: >60 mL/min (ref >60)
GFR calc non Af Amer: >60 mL/min (ref >60)
Glucose, Bld: 149 mg/dL — ABNORMAL HIGH (ref 70–99)
Potassium: 4.7 mmol/L (ref 3.5–5.1)
Sodium: 136 mmol/L (ref 135–145)

## 2020-05-05 LAB — HIV ANTIBODY (ROUTINE TESTING W REFLEX): HIV Screen 4th Generation wRfx: NONREACTIVE

## 2020-05-05 LAB — GLUCOSE, CAPILLARY: Glucose-Capillary: 143 mg/dL — ABNORMAL HIGH (ref 70–99)

## 2020-05-05 MED ORDER — DOCUSATE SODIUM 100 MG PO CAPS
100.0000 mg | ORAL_CAPSULE | Freq: Two times a day (BID) | ORAL | Status: DC
  Administered 2020-05-05 – 2020-05-09 (×6): 100 mg via ORAL
  Filled 2020-05-05 (×6): qty 1

## 2020-05-05 MED ORDER — HYDROCHLOROTHIAZIDE 12.5 MG PO CAPS
12.5000 mg | ORAL_CAPSULE | Freq: Every day | ORAL | Status: DC
  Administered 2020-05-09: 12.5 mg via ORAL
  Filled 2020-05-05 (×5): qty 1

## 2020-05-05 MED ORDER — VERAPAMIL HCL ER 120 MG PO TBCR
120.0000 mg | EXTENDED_RELEASE_TABLET | Freq: Every day | ORAL | Status: DC
  Administered 2020-05-06 – 2020-05-09 (×4): 120 mg via ORAL
  Filled 2020-05-05 (×5): qty 1

## 2020-05-05 MED ORDER — INSULIN GLARGINE 100 UNIT/ML ~~LOC~~ SOLN
60.0000 [IU] | Freq: Every day | SUBCUTANEOUS | Status: DC
  Administered 2020-05-06: 50 [IU] via SUBCUTANEOUS
  Filled 2020-05-05 (×2): qty 0.6

## 2020-05-05 MED ORDER — INSULIN ASPART 100 UNIT/ML ~~LOC~~ SOLN
0.0000 [IU] | Freq: Three times a day (TID) | SUBCUTANEOUS | Status: DC
  Administered 2020-05-06: 4 [IU] via SUBCUTANEOUS
  Administered 2020-05-07 (×2): 3 [IU] via SUBCUTANEOUS
  Administered 2020-05-07: 4 [IU] via SUBCUTANEOUS
  Administered 2020-05-08: 6 [IU] via SUBCUTANEOUS
  Administered 2020-05-08: 2 [IU] via SUBCUTANEOUS

## 2020-05-05 MED ORDER — MAGNESIUM OXIDE 400 (241.3 MG) MG PO TABS
200.0000 mg | ORAL_TABLET | Freq: Every day | ORAL | Status: DC
  Administered 2020-05-05 – 2020-05-09 (×3): 200 mg via ORAL
  Filled 2020-05-05 (×3): qty 1

## 2020-05-05 MED ORDER — BISACODYL 10 MG RE SUPP: 10.0000 mg | Freq: Every day | RECTAL | Status: DC | PRN

## 2020-05-05 MED ORDER — CYCLOBENZAPRINE HCL 10 MG PO TABS
10.0000 mg | ORAL_TABLET | Freq: Three times a day (TID) | ORAL | Status: DC | PRN
  Administered 2020-05-05 – 2020-05-09 (×5): 10 mg via ORAL
  Filled 2020-05-05 (×5): qty 1

## 2020-05-05 MED ORDER — METOPROLOL TARTRATE 5 MG/5ML IV SOLN: 5.0000 mg | Freq: Four times a day (QID) | INTRAVENOUS | Status: DC | PRN

## 2020-05-05 MED ORDER — ADULT MULTIVITAMIN W/MINERALS CH
1.0000 | ORAL_TABLET | Freq: Every day | ORAL | Status: DC
  Administered 2020-05-05 – 2020-05-09 (×3): 1 via ORAL
  Filled 2020-05-05 (×3): qty 1

## 2020-05-05 MED ORDER — DICYCLOMINE HCL 10 MG PO CAPS
10.0000 mg | ORAL_CAPSULE | Freq: Two times a day (BID) | ORAL | Status: DC
  Administered 2020-05-05 – 2020-05-09 (×6): 10 mg via ORAL
  Filled 2020-05-05 (×6): qty 1

## 2020-05-05 MED ORDER — HYDROMORPHONE HCL 1 MG/ML IJ SOLN
0.5000 mg | INTRAMUSCULAR | Status: DC | PRN
  Administered 2020-05-06: 0.5 mg via INTRAVENOUS
  Administered 2020-05-06 – 2020-05-09 (×17): 1 mg via INTRAVENOUS
  Filled 2020-05-05 (×18): qty 1

## 2020-05-05 MED ORDER — OXYCODONE HCL 5 MG PO TABS
5.0000 mg | ORAL_TABLET | ORAL | Status: DC | PRN
  Administered 2020-05-05 – 2020-05-09 (×7): 5 mg via ORAL
  Filled 2020-05-05 (×7): qty 1

## 2020-05-05 MED ORDER — SODIUM CHLORIDE 0.9% FLUSH
3.0000 mL | Freq: Two times a day (BID) | INTRAVENOUS | Status: DC
  Administered 2020-05-05 – 2020-05-07 (×5): 3 mL via INTRAVENOUS

## 2020-05-05 MED ORDER — ACETAMINOPHEN 650 MG RE SUPP: 650.0000 mg | Freq: Four times a day (QID) | RECTAL | Status: DC | PRN

## 2020-05-05 MED ORDER — ONDANSETRON HCL 4 MG PO TABS: 4.0000 mg | ORAL_TABLET | Freq: Four times a day (QID) | ORAL | Status: DC | PRN

## 2020-05-05 MED ORDER — FLUTICASONE PROPIONATE 50 MCG/ACT NA SUSP
1.0000 | Freq: Every day | NASAL | Status: DC
  Filled 2020-05-05: qty 16

## 2020-05-05 MED ORDER — LACTATED RINGERS IV SOLN: INTRAVENOUS | Status: DC

## 2020-05-05 MED ORDER — TELMISARTAN-HCTZ 80-12.5 MG PO TABS: 1.0000 | ORAL_TABLET | Freq: Every day | ORAL | Status: DC

## 2020-05-05 MED ORDER — MOMETASONE FURO-FORMOTEROL FUM 200-5 MCG/ACT IN AERO
2.0000 | INHALATION_SPRAY | Freq: Two times a day (BID) | RESPIRATORY_TRACT | Status: DC
  Filled 2020-05-05: qty 8.8

## 2020-05-05 MED ORDER — VITAMIN D 25 MCG (1000 UNIT) PO TABS
1000.0000 [IU] | ORAL_TABLET | Freq: Two times a day (BID) | ORAL | Status: DC
  Administered 2020-05-05 – 2020-05-08 (×5): 1000 [IU] via ORAL
  Filled 2020-05-05 (×6): qty 1

## 2020-05-05 MED ORDER — IRBESARTAN 300 MG PO TABS
300.0000 mg | ORAL_TABLET | Freq: Every day | ORAL | Status: DC
  Administered 2020-05-06 – 2020-05-08 (×3): 300 mg via ORAL
  Filled 2020-05-05 (×5): qty 1

## 2020-05-05 MED ORDER — HYOSCYAMINE SULFATE 0.125 MG SL SUBL
0.1250 mg | SUBLINGUAL_TABLET | Freq: Every day | SUBLINGUAL | Status: DC | PRN
  Administered 2020-05-06: 0.125 mg via SUBLINGUAL
  Filled 2020-05-05 (×2): qty 1

## 2020-05-05 MED ORDER — ACETAMINOPHEN 325 MG PO TABS
650.0000 mg | ORAL_TABLET | Freq: Four times a day (QID) | ORAL | Status: DC | PRN
  Administered 2020-05-08 (×2): 650 mg via ORAL
  Filled 2020-05-05 (×2): qty 2

## 2020-05-05 MED ORDER — SODIUM CHLORIDE (HYPERTONIC) 5 % OP SOLN
1.0000 [drp] | Freq: Every day | OPHTHALMIC | Status: DC
  Administered 2020-05-05 – 2020-05-08 (×4): 1 [drp] via OPHTHALMIC
  Filled 2020-05-05 (×2): qty 15

## 2020-05-05 MED ORDER — ONDANSETRON HCL 4 MG/2ML IJ SOLN: 4.0000 mg | Freq: Four times a day (QID) | INTRAMUSCULAR | Status: DC | PRN

## 2020-05-05 MED ORDER — PANTOPRAZOLE SODIUM 20 MG PO TBEC
20.0000 mg | DELAYED_RELEASE_TABLET | Freq: Two times a day (BID) | ORAL | Status: DC
  Administered 2020-05-05 – 2020-05-09 (×6): 20 mg via ORAL
  Filled 2020-05-05 (×9): qty 1

## 2020-05-05 MED ORDER — SEMAGLUTIDE (1 MG/DOSE) 2 MG/1.5ML ~~LOC~~ SOPN: 1.0000 mg | PEN_INJECTOR | SUBCUTANEOUS | Status: DC

## 2020-05-05 MED ORDER — POLYETHYLENE GLYCOL 3350 17 G PO PACK
17.0000 g | PACK | Freq: Every day | ORAL | Status: DC | PRN
  Administered 2020-05-05: 17 g via ORAL
  Filled 2020-05-05: qty 1

## 2020-05-05 MED ORDER — B COMPLEX-C PO TABS
1.0000 | ORAL_TABLET | Freq: Every day | ORAL | Status: DC
  Administered 2020-05-08 – 2020-05-09 (×2): 1 via ORAL
  Filled 2020-05-05 (×5): qty 1

## 2020-05-05 NOTE — Progress Notes (Signed)
Report given to roni, RN at bedside, all questions answered.

## 2020-05-05 NOTE — Progress Notes (Addendum)
Pt arrived on unit, VSS, pt states she is comfortable, no pain d/t the fact that she took her home meds, including percocet and flexeril,  before arriving on the unit. Unable to admit currently d/t incorrect admission paperwork, NS is working on that with the admission office. Until then will continue to monitor.

## 2020-05-06 ENCOUNTER — Encounter (HOSPITAL_COMMUNITY): Payer: Self-pay | Admitting: Neurosurgery

## 2020-05-06 ENCOUNTER — Other Ambulatory Visit: Payer: Self-pay

## 2020-05-06 DIAGNOSIS — Z981 Arthrodesis status: Secondary | ICD-10-CM

## 2020-05-06 DIAGNOSIS — M4646 Discitis, unspecified, lumbar region: Secondary | ICD-10-CM

## 2020-05-06 DIAGNOSIS — E119 Type 2 diabetes mellitus without complications: Secondary | ICD-10-CM

## 2020-05-06 DIAGNOSIS — E785 Hyperlipidemia, unspecified: Secondary | ICD-10-CM | POA: Diagnosis present

## 2020-05-06 DIAGNOSIS — M4626 Osteomyelitis of vertebra, lumbar region: Secondary | ICD-10-CM

## 2020-05-06 DIAGNOSIS — I10 Essential (primary) hypertension: Secondary | ICD-10-CM | POA: Diagnosis present

## 2020-05-06 LAB — GLUCOSE, CAPILLARY
Glucose-Capillary: 123 mg/dL — ABNORMAL HIGH (ref 70–99)
Glucose-Capillary: 117 mg/dL — ABNORMAL HIGH (ref 70–99)
Glucose-Capillary: 104 mg/dL — ABNORMAL HIGH (ref 70–99)
Glucose-Capillary: 174 mg/dL — ABNORMAL HIGH (ref 70–99)
Glucose-Capillary: 133 mg/dL — ABNORMAL HIGH (ref 70–99)

## 2020-05-06 LAB — SEDIMENTATION RATE: Sed Rate: 111 mm/h — ABNORMAL HIGH (ref 0–22)

## 2020-05-06 MED ORDER — INSULIN GLARGINE 100 UNIT/ML ~~LOC~~ SOLN
50.0000 [IU] | Freq: Every day | SUBCUTANEOUS | Status: DC
  Administered 2020-05-07 – 2020-05-09 (×3): 50 [IU] via SUBCUTANEOUS
  Filled 2020-05-06 (×3): qty 0.5

## 2020-05-06 MED ORDER — POLYVINYL ALCOHOL 1.4 % OP SOLN
1.0000 [drp] | OPHTHALMIC | Status: DC | PRN
  Filled 2020-05-06: qty 15

## 2020-05-06 NOTE — H&P (Signed)
Subjective: The patient is a 64 year old diabetic white female on whom I performed a lumbar fusion approximately 2 months ago.  She initially did well but developed some drainage.  She was treated empirically with Keflex and the drainage stopped.  He is developed worsening back and some left leg pain.  She was worked up with a lumbar MRI which demonstrated findings consistent with discitis, osteomyelitis myositis and a small epidural fluid collection at L4-5.  She was admitted for further work-up, needle aspiration, PICC line, etc.  Blood cultures are pending.  Past Medical History:  Diagnosis Date  . Chronic back pain   . Diabetes mellitus   . Dysrhythmia    pat  . Edema    lower extremity  . Encounter for long-term (current) use of other medications   . Family history of adverse reaction to anesthesia    mother slow to wake  . Fluid retention   . GERD (gastroesophageal reflux disease)   . Hyperlipidemia    no meds  . Hypertension   . IBS (irritable bowel syndrome)   . Osteoarthritis of knee   . Pneumonia    several  . PONV (postoperative nausea and vomiting)    occ  . Unspecified vitamin D deficiency     Past Surgical History:  Procedure Laterality Date  . CHOLECYSTECTOMY N/A 06/30/2016   Procedure: LAPAROSCOPIC CHOLECYSTECTOMY;  Surgeon: Arta Bruce Kinsinger, MD;  Location: [facility];  Service: General;  Laterality: N/A;  . COLONOSCOPY  10/2020  . KNEE ARTHROSCOPY Left 06  . knee replacment Right   . right knee arthroscopy    . TONSILLECTOMY  63  . TOTAL KNEE ARTHROPLASTY Left 06/03/2013   Procedure: LEFT TOTAL KNEE ARTHROPLASTY;  Surgeon: Vickey Huger, MD;  Location: [place];  Service: Orthopedics;  Laterality: Left;  . TUBAL LIGATION  89  . UPPER GI ENDOSCOPY      Allergies  Allergen Reactions  . Latex Itching, Rash and Other (See Comments)    NO Band-Aids!!  . Eggs Or Egg-Derived Products Nausea And Vomiting  . Januvia [Sitagliptin] Palpitations and Rash  . Metformin And  Related Itching, Palpitations and Rash    Social History   Tobacco Use  . Smoking status: Never Smoker  . Smokeless tobacco: Never Used  Substance Use Topics  . Alcohol use: No    History reviewed. No pertinent family history. Prior to Admission medications   Medication Sig Start Date End Date Taking? Authorizing Provider  acetaminophen (TYLENOL) 650 MG CR tablet Take 1,300 mg by mouth in the morning and at bedtime.   Yes [provider]  aspirin 81 MG tablet Take 81 mg by mouth at bedtime.    Yes [provider]  b complex vitamins capsule Take 1 capsule by mouth daily.   Yes [provider]  celecoxib (CELEBREX) 200 MG capsule Take 200 mg by mouth 2 (two) times daily.    Yes [provider]  Cholecalciferol (VITAMIN D-3) 25 MCG (1000 UT) CAPS Take 1,000 Units by mouth in the morning and at bedtime.   Yes [provider]  cyclobenzaprine (FLEXERIL) 10 MG tablet Take 1 tablet (10 mg total) by mouth 3 (three) times daily as needed for muscle spasms. Patient taking differently: Take 10 mg by mouth every 6 (six) hours.  02/13/20  Yes Newman Pies, MD  dicyclomine (BENTYL) 10 MG capsule Take 10 mg by mouth 2 (two) times daily.   Yes [provider]  docusate sodium (COLACE) 100 MG  capsule Take 1 capsule (100 mg total) by mouth 2 (two) times daily. 02/13/20  Yes Newman Pies, MD  gabapentin (NEURONTIN) 300 MG capsule Take 300 mg by mouth 3 (three) times daily.   Yes [provider]  glycerin adult 2 g suppository Place 1 suppository rectally as needed for constipation.   Yes [provider]  hyoscyamine (LEVSIN SL) 0.125 MG SL tablet Place 0.125 mg under the tongue daily as needed for cramping.  03/24/16  Yes [provider]  insulin aspart (NOVOLOG) 100 UNIT/ML injection Inject 76 Units into the skin See admin instructions. Via VGO insulin pump, up to 76 units/day   Yes [provider]  Insulin  Disposable Pump (V-GO 20) KIT continuous. 05/03/20  Yes [provider]  Insulin Glargine (BASAGLAR KWIKPEN) 100 UNIT/ML Inject 50 Units into the skin daily after breakfast.   Yes [provider]  magnesium citrate SOLN Take 10 mLs by mouth once as needed for mild constipation.    Yes [provider]  Magnesium Gluconate 250 MG TABS Take 250 mg by mouth at bedtime.    Yes [provider]  Multiple Vitamin (MULTIVITAMIN) capsule Take 1 capsule by mouth daily.   Yes [provider]  nystatin cream (MYCOSTATIN) Apply 1 application topically See admin instructions. Apply to perineal area once a day   Yes [provider]  omeprazole (PRILOSEC) 20 MG capsule Take 20 mg by mouth 2 (two) times daily. 03/02/20  Yes [provider]  oxyCODONE-acetaminophen (PERCOCET/ROXICET) 5-325 MG tablet Take 1-2 tablets by mouth every 4 (four) hours as needed for moderate pain. Patient taking differently: Take 2 tablets by mouth every 6 (six) hours.  02/13/20  Yes Newman Pies, MD  Semaglutide, 1 MG/DOSE, (OZEMPIC, 1 MG/DOSE,) 2 MG/1.5ML SOPN Inject 1 mg into the skin every Sunday.    Yes [provider]  sodium chloride (MURO 128) 5 % ophthalmic ointment Place 1 application into both eyes at bedtime.   Yes [provider]  telmisartan-hydrochlorothiazide (MICARDIS HCT) 80-12.5 MG tablet Take 1 tablet by mouth daily.   Yes [provider]  verapamil (VERELAN PM) 120 MG 24 hr capsule Take 120 mg by mouth daily. 02/28/20  Yes [provider]  Insulin Pen Needle (PEN NEEDLES 31GX5/16") 31G X 8 MM MISC by Does not apply route.    [provider]  Insulin Syringe-Needle U-100 (ACCUSURE INS SYR 1CC/31GX5/16") 31G X 5/16" 1 ML MISC by Does not apply route.    [provider]     Review of Systems  Positive ROS: As above  All other systems have been reviewed and were otherwise negative with the exception of  those mentioned in the HPI and as above.  Objective: Vital signs in last 24 hours: Temp:  [97.9 F (36.6 C)-98.8 F (37.1 C)] 98.3 F (36.8 C) (06/16 0753) Pulse Rate:  [95-101] 101 (06/16 0753) Resp:  [16-17] 17 (06/16 0753) BP: (127-144)/(46-55) 127/47 (06/16 0753) SpO2:  [94 %-100 %] 94 % (06/16 0753) Weight:  [111.2 kg] 111.2 kg (06/15 2100) Estimated body mass index is 46.32 kg/m as calculated from the following:   Height as of this encounter: _0  (1.549 m).   Weight as of this encounter: 111.2 kg.   Exam:  General: An alert and pleasant obese 64 year old white female in no apparent distress  HEENT: Unremarkable  Thorax: Symmetric  Abdomen: Soft  Back exam: The patient's lumbar incision is well-healed.  Neurologic exam: The patient is alert  and oriented.  Her lower extremity strength is normal.   Data Review Lab Results  Component Value Date   WBC 16.0 (H) 05/05/2020   HGB 9.0 (L) 05/05/2020   HCT 29.7 (L) 05/05/2020   MCV 80.7 05/05/2020   PLT 491 (H) 05/05/2020   Lab Results  Component Value Date   NA 136 05/05/2020   K 4.7 05/05/2020   CL 101 05/05/2020   CO2 23 05/05/2020   BUN 19 05/05/2020   CREATININE 0.80 05/05/2020   GLUCOSE 149 (H) 05/05/2020   Lab Results  Component Value Date   INR 0.98 05/27/2013    Assessment/Plan: Lumbar discitis, osteomyelitis, myositis: I have discussed the situation with the patient.  We have asked interventional radiology to perform a CT-guided aspiration of the epidural fluid for cultures.  We will start empiric antibiotics after the needle aspiration.  Blood cultures are pending.  I will ask ID to see the patient to help with antibiotics and duration.  I have answered all her questions.   Ophelia Charter 05/06/2020 9:04 AM

## 2020-05-06 NOTE — Consult Note (Addendum)
[facility] for Infectious Disease    Date of Admission:  05/05/2020     Total days of antibiotics 0               Reason for Consult: Lumbar Discitis     Referring Provider: Arnoldo Morale  Primary Care Provider: Serita Grammes, MD   Assessment: Alexis Choi is a 64 y.o. female with diabetes.  She had lumbar 3-5 fusion February 12, 2020. Early wound infection / dehiscence which required 3 week course of PO Keflex; this eventually healed recently.  Since this time over the last 4-5 weeks she has experienced progressively more back pain, stiffness, mobility limitations and nowwith evidence of lumbar discitis, osteomyelitis, epidural fluid collection and paraspinal myositis based on recent MRI findings.  Awaiting IR guided disc aspirate for cultures - would check anaerobic & aerobic cultures given suspicion for sensitive staphylococcus. Would feel comfortable to start Cefazolin IV 2 gm TID given improvement on course of PO keflex and adjust based on cultures.   She does have hardware and likely will need consideration of Rifampin addition for synergy. Will discuss with pharmacy and see if any prohibiting medication interactions.   Please continue to hold antibiotics until sample can be obtained. Would be comfortable placing PICC line if blood cultures are negative 48 hours given lack of systemic symptoms prior to admission.  ESR and CRP in AM for therapeutic monitoring.  HgbA1C 7.5% in late March    Plan: 1. Follow micro from blood and IR disc aspirate  2. Once aspirate is done start Cefazolin 2 gm IV TID  3. Hold on PICC for now pending #1   4. ESR and CRP in AM     Active Problems:   Lumbar discitis   Type 2 diabetes mellitus without complications (HCC)    B-complex with vitamin C  1 tablet Oral Daily   cholecalciferol  1,000 Units Oral BID   dicyclomine  10 mg Oral BID   docusate sodium  100 mg Oral BID   fluticasone  1 spray Each Nare Daily    irbesartan  300 mg Oral Daily   Or   hydrochlorothiazide  12.5 mg Oral Daily   insulin aspart  0-20 Units Subcutaneous TID WC   [START ON 05/07/2020] insulin glargine  50 Units Subcutaneous Daily   magnesium oxide  200 mg Oral Daily   mometasone-formoterol  2 puff Inhalation BID   multivitamin with minerals  1 tablet Oral Daily   pantoprazole  20 mg Oral BID   [START ON 05/10/2020] Semaglutide (1 MG/DOSE)  1 mg Subcutaneous Weekly   sodium chloride  1 drop Both Eyes QHS   sodium chloride flush  3 mL Intravenous Q12H   verapamil  120 mg Oral Daily    HPI: Alexis Choi is a 64 y.o. female here for evaluation and treatment of lumbar spine discitis/osteomyelitis and epidural fluid collection.   Underwent lumbar fusion with Dr. Arnoldo Morale 02-12-2020 with Dr. Arnoldo Morale. Initially did well post-op but did develop some drainage and dehiscence at the incision site a few weeks after surgery. She was treated with Keflex x 3 weeks which stopped the drainage; estimates last dose of antibiotics to be about 4-5 weeks ago. She was also treated for a UTI at some point while on Keflex but can't recall what the name of the antibiotic was.   She initially fevers and shaking chills with original wound infection/drainage but has not had any  since. She does feel like her back pain has progressively worsened since she stopped antibiotics. She does have some weakness in her lower extremities and feels her lower back "tightening up". She has had full control over bowels, albeit struggling with constipation. She does have some urinary incontinence that she deals with due to effort required to get up to get to the restroom.    Review of Systems: Review of Systems  Constitutional: Negative for chills, fever, malaise/fatigue and weight loss.  Respiratory: Negative for cough and sputum production.   Cardiovascular: Negative for chest pain and leg swelling.  Gastrointestinal: Negative for abdominal pain,  diarrhea and vomiting.  Genitourinary: Negative for dysuria and flank pain.  Musculoskeletal: Positive for back pain and joint pain (occasional hip pain ). Negative for myalgias and neck pain.  Skin: Negative for rash.  Neurological: Positive for weakness (b/l legs). Negative for dizziness, tingling and headaches.  Psychiatric/Behavioral: Negative for depression and substance abuse. The patient is not nervous/anxious and does not have insomnia.      Past Medical History:  Diagnosis Date   Chronic back pain    Diabetes mellitus    Dysrhythmia    pat   Edema    lower extremity   Encounter for long-term (current) use of other medications    Family history of adverse reaction to anesthesia    mother slow to wake   Fluid retention    GERD (gastroesophageal reflux disease)    Hyperlipidemia    no meds   Hypertension    IBS (irritable bowel syndrome)    Osteoarthritis of knee    Pneumonia    several   PONV (postoperative nausea and vomiting)    occ   Unspecified vitamin D deficiency     Social History   Tobacco Use   Smoking status: Never Smoker   Smokeless tobacco: Never Used  Vaping Use   Vaping Use: Never used  Substance Use Topics   Alcohol use: No   Drug use: No    History reviewed. No pertinent family history. Allergies  Allergen Reactions   Latex Itching, Rash and Other (See Comments)    NO Band-Aids!!   Eggs Or Egg-Derived Products Nausea And Vomiting   Januvia [Sitagliptin] Palpitations and Rash   Metformin And Related Itching, Palpitations and Rash    OBJECTIVE: Blood pressure (!) 127/43, pulse (!) 102, temperature 98.2 F (36.8 C), temperature source Oral, resp. rate 16, height '5\' 1"'$  (1.549 m), weight 111.2 kg, SpO2 96 %.  Physical Exam Constitutional:      Appearance: Normal appearance. She is not ill-appearing.     Comments: Resting quietly in bed. Appears uncomfortable.   HENT:     Mouth/Throat:     Mouth: Mucous  membranes are moist.     Pharynx: Oropharynx is clear.  Eyes:     General: No scleral icterus. Cardiovascular:     Rate and Rhythm: Normal rate and regular rhythm.     Heart sounds: No murmur heard.   Pulmonary:     Effort: Pulmonary effort is normal.  Abdominal:     General: Bowel sounds are normal.     Palpations: Abdomen is soft.  Musculoskeletal:     Cervical back: Normal. No tenderness.     Thoracic back: Normal.     Lumbar back: Edema, tenderness and bony tenderness present. Decreased range of motion.       Back:     Comments: Tenderness with palpation over vertebra and surrounding paraspinal muscles.  There does feel to be some swelling overlying paraspinal muscles.   Skin:    General: Skin is warm and dry.     Capillary Refill: Capillary refill takes less than 2 seconds.          Comments: Blanching erythema surrounding incision, which appears healed.   Neurological:     Mental Status: She is oriented to person, place, and time.  Psychiatric:        Mood and Affect: Mood normal.        Thought Content: Thought content normal.     Lab Results Lab Results  Component Value Date   WBC 16.0 (H) 05/05/2020   HGB 9.0 (L) 05/05/2020   HCT 29.7 (L) 05/05/2020   MCV 80.7 05/05/2020   PLT 491 (H) 05/05/2020    Lab Results  Component Value Date   CREATININE 0.80 05/05/2020   BUN 19 05/05/2020   NA 136 05/05/2020   K 4.7 05/05/2020   CL 101 05/05/2020   CO2 23 05/05/2020    Lab Results  Component Value Date   ALT 23 05/27/2013   AST 24 05/27/2013   ALKPHOS 66 05/27/2013   BILITOT 0.3 05/27/2013     Microbiology: Recent Results (from the past 240 hour(s))  Culture, blood (routine x 2)     Status: None (Preliminary result)   Collection Time: 05/05/20  9:45 PM   Specimen: BLOOD  Result Value Ref Range Status   Specimen Description BLOOD RIGHT ANTECUBITAL  Final   Special Requests   Final    BOTTLES DRAWN AEROBIC ONLY Blood Culture adequate volume   Culture    Final    NO GROWTH < 24 HOURS Performed at [facility], [address] [place], [place] [address]    Report Status PENDING  Incomplete  Culture, blood (routine x 2)     Status: None (Preliminary result)   Collection Time: 05/05/20  9:56 PM   Specimen: BLOOD RIGHT HAND  Result Value Ref Range Status   Specimen Description BLOOD RIGHT HAND  Final   Special Requests   Final    BOTTLES DRAWN AEROBIC ONLY Blood Culture adequate volume   Culture   Final    NO GROWTH < 24 HOURS Performed at [facility], [place] [address], [place], [place] [address]    Report Status PENDING  Incomplete     Janene Madeira, MSN, NP-C [facility] [email or web address] Pager: [phone] Office: [phone] [place]: [phone]   05/06/2020 2:16 PM

## 2020-05-06 NOTE — Progress Notes (Signed)
IR/NIR consulted by Val Eagle, NP for possible image-guided lumbar disc aspiration.  MR lumbar spine reviewed by Dr. Tommie Sams [facility]) who states collection is epidural and recommended IR review for possible lumbar posterior fluid collection aspiration. Images were then reviewed by Dr. Deanne Coffer who approves procedure. Discussed with Dr. Orvan Falconer (ID) who states aspiration of this fluid should be sufficient for sampling. Plan for image-guided lumbar posterior fluid collection aspiration tentatively for tomorrow 05/07/2020 in IR. Patient will be NPO at midnight. Patient will be seen/consented tomorrow prior to procedure.  Please call IR with questions/concerns.   Waylan Boga Wanell Lorenzi, PA-C 05/06/2020, 4:31 PM

## 2020-05-06 NOTE — Progress Notes (Signed)
Dear Doctor:  This patient has been identified as a candidate for PICC for the following reason (s): restarts due to phlebitis and infiltration in 24 hours If you agree, please write an order for the indicated device.   Thank you for supporting the early vascular access assessment program.

## 2020-05-06 NOTE — Progress Notes (Signed)
Subjective: The patient is a 64-year-old diabetic white female on whom I performed a lumbar fusion approximately 2 months ago.  She initially did well but developed some drainage.  She was treated empirically with Keflex and the drainage stopped.  He is developed worsening back and some left leg pain.  She was worked up with a lumbar MRI which demonstrated findings consistent with discitis, osteomyelitis myositis and a small epidural fluid collection at L4-5.  She was admitted for further work-up, needle aspiration, PICC line, etc.  Blood cultures are pending.  Past Medical History:  Diagnosis Date  . Chronic back pain   . Diabetes mellitus   . Dysrhythmia    pat  . Edema    lower extremity  . Encounter for long-term (current) use of other medications   . Family history of adverse reaction to anesthesia    mother slow to wake  . Fluid retention   . GERD (gastroesophageal reflux disease)   . Hyperlipidemia    no meds  . Hypertension   . IBS (irritable bowel syndrome)   . Osteoarthritis of knee   . Pneumonia    several  . PONV (postoperative nausea and vomiting)    occ  . Unspecified vitamin D deficiency     Past Surgical History:  Procedure Laterality Date  . CHOLECYSTECTOMY N/A 06/30/2016   Procedure: LAPAROSCOPIC CHOLECYSTECTOMY;  Surgeon: Luke Aaron Kinsinger, MD;  Location: [facility];  Service: General;  Laterality: N/A;  . COLONOSCOPY  10/2020  . KNEE ARTHROSCOPY Left 06  . knee replacment Right   . right knee arthroscopy    . TONSILLECTOMY  63  . TOTAL KNEE ARTHROPLASTY Left 06/03/2013   Procedure: LEFT TOTAL KNEE ARTHROPLASTY;  Surgeon: Steve Lucey, MD;  Location: [facility];  Service: Orthopedics;  Laterality: Left;  . TUBAL LIGATION  89  . UPPER GI ENDOSCOPY      Allergies  Allergen Reactions  . Latex Itching, Rash and Other (See Comments)    NO Band-Aids!!  . Eggs Or Egg-Derived Products Nausea And Vomiting  . Januvia [Sitagliptin] Palpitations and Rash  . Metformin And  Related Itching, Palpitations and Rash    Social History   Tobacco Use  . Smoking status: Never Smoker  . Smokeless tobacco: Never Used  Substance Use Topics  . Alcohol use: No    History reviewed. No pertinent family history. Prior to Admission medications   Medication Sig Start Date End Date Taking? Authorizing Provider  acetaminophen (TYLENOL) 650 MG CR tablet Take 1,300 mg by mouth in the morning and at bedtime.   Yes [provider]  aspirin 81 MG tablet Take 81 mg by mouth at bedtime.    Yes [provider]  b complex vitamins capsule Take 1 capsule by mouth daily.   Yes [provider]  celecoxib (CELEBREX) 200 MG capsule Take 200 mg by mouth 2 (two) times daily.    Yes [provider]  Cholecalciferol (VITAMIN D-3) 25 MCG (1000 UT) CAPS Take 1,000 Units by mouth in the morning and at bedtime.   Yes [provider]  cyclobenzaprine (FLEXERIL) 10 MG tablet Take 1 tablet (10 mg total) by mouth 3 (three) times daily as needed for muscle spasms. Patient taking differently: Take 10 mg by mouth every 6 (six) hours.  02/13/20  Yes Antonino Nienhuis, MD  dicyclomine (BENTYL) 10 MG capsule Take 10 mg by mouth 2 (two) times daily.   Yes [provider]  docusate sodium (COLACE) 100 MG   capsule Take 1 capsule (100 mg total) by mouth 2 (two) times daily. 02/13/20  Yes Jaegar Croft, MD  gabapentin (NEURONTIN) 300 MG capsule Take 300 mg by mouth 3 (three) times daily.   Yes [provider]  glycerin adult 2 g suppository Place 1 suppository rectally as needed for constipation.   Yes [provider]  hyoscyamine (LEVSIN SL) 0.125 MG SL tablet Place 0.125 mg under the tongue daily as needed for cramping.  03/24/16  Yes [provider]  insulin aspart (NOVOLOG) 100 UNIT/ML injection Inject 76 Units into the skin See admin instructions. Via VGO insulin pump, up to 76 units/day   Yes [provider]  Insulin  Disposable Pump (V-GO 20) KIT continuous. 05/03/20  Yes [provider]  Insulin Glargine (BASAGLAR KWIKPEN) 100 UNIT/ML Inject 50 Units into the skin daily after breakfast.   Yes [provider]  magnesium citrate SOLN Take 10 mLs by mouth once as needed for mild constipation.    Yes [provider]  Magnesium Gluconate 250 MG TABS Take 250 mg by mouth at bedtime.    Yes [provider]  Multiple Vitamin (MULTIVITAMIN) capsule Take 1 capsule by mouth daily.   Yes [provider]  nystatin cream (MYCOSTATIN) Apply 1 application topically See admin instructions. Apply to perineal area once a day   Yes [provider]  omeprazole (PRILOSEC) 20 MG capsule Take 20 mg by mouth 2 (two) times daily. 03/02/20  Yes [provider]  oxyCODONE-acetaminophen (PERCOCET/ROXICET) 5-325 MG tablet Take 1-2 tablets by mouth every 4 (four) hours as needed for moderate pain. Patient taking differently: Take 2 tablets by mouth every 6 (six) hours.  02/13/20  Yes Mazen Marcin, MD  Semaglutide, 1 MG/DOSE, (OZEMPIC, 1 MG/DOSE,) 2 MG/1.5ML SOPN Inject 1 mg into the skin every Sunday.    Yes [provider]  sodium chloride (MURO 128) 5 % ophthalmic ointment Place 1 application into both eyes at bedtime.   Yes [provider]  telmisartan-hydrochlorothiazide (MICARDIS HCT) 80-12.5 MG tablet Take 1 tablet by mouth daily.   Yes [provider]  verapamil (VERELAN PM) 120 MG 24 hr capsule Take 120 mg by mouth daily. 02/28/20  Yes [provider]  Insulin Pen Needle (PEN NEEDLES 31GX5/16") 31G X 8 MM MISC by Does not apply route.    [provider]  Insulin Syringe-Needle U-100 (ACCUSURE INS SYR 1CC/31GX5/16") 31G X 5/16" 1 ML MISC by Does not apply route.    [provider]     Review of Systems  Positive ROS: As above  All other systems have been reviewed and were otherwise negative with the exception of  those mentioned in the HPI and as above.  Objective: Vital signs in last 24 hours: Temp:  [97.9 F (36.6 C)-98.8 F (37.1 C)] 98.3 F (36.8 C) (06/16 0753) Pulse Rate:  [95-101] 101 (06/16 0753) Resp:  [16-17] 17 (06/16 0753) BP: (127-144)/(46-55) 127/47 (06/16 0753) SpO2:  [94 %-100 %] 94 % (06/16 0753) Weight:  [111.2 kg] 111.2 kg (06/15 2100) Estimated body mass index is 46.32 kg/m as calculated from the following:   Height as of this encounter: 5' 1" (1.549 m).   Weight as of this encounter: 111.2 kg.   Exam:  General: An alert and pleasant obese 63-year-old white female in no apparent distress  HEENT: Unremarkable  Thorax: Symmetric  Abdomen: Soft  Back exam: The patient's lumbar incision is well-healed.  Neurologic exam: The patient is alert   and oriented.  Her lower extremity strength is normal.   Data Review Lab Results  Component Value Date   WBC 16.0 (H) 05/05/2020   HGB 9.0 (L) 05/05/2020   HCT 29.7 (L) 05/05/2020   MCV 80.7 05/05/2020   PLT 491 (H) 05/05/2020   Lab Results  Component Value Date   NA 136 05/05/2020   K 4.7 05/05/2020   CL 101 05/05/2020   CO2 23 05/05/2020   BUN 19 05/05/2020   CREATININE 0.80 05/05/2020   GLUCOSE 149 (H) 05/05/2020   Lab Results  Component Value Date   INR 0.98 05/27/2013    Assessment/Plan: Lumbar discitis, osteomyelitis, myositis: I have discussed the situation with the patient.  We have asked interventional radiology to perform a CT-guided aspiration of the epidural fluid for cultures.  We will start empiric antibiotics after the needle aspiration.  Blood cultures are pending.  I will ask ID to see the patient to help with antibiotics and duration.  I have answered all her questions.   Tiaira Arambula D Micayla Brathwaite 05/06/2020 9:04 AM  

## 2020-05-06 NOTE — Plan of Care (Signed)

## 2020-05-07 ENCOUNTER — Inpatient Hospital Stay (HOSPITAL_COMMUNITY): Payer: BC Managed Care – PPO

## 2020-05-07 ENCOUNTER — Inpatient Hospital Stay: Payer: Self-pay

## 2020-05-07 LAB — HEMOGLOBIN A1C
Hgb A1c MFr Bld: 7.3 % — ABNORMAL HIGH (ref 4.8–5.6)
Mean Plasma Glucose: 163 mg/dL

## 2020-05-07 LAB — C-REACTIVE PROTEIN: CRP: 20.2 mg/dL — ABNORMAL HIGH (ref <1.0)

## 2020-05-07 LAB — GLUCOSE, CAPILLARY
Glucose-Capillary: 164 mg/dL — ABNORMAL HIGH (ref 70–99)
Glucose-Capillary: 132 mg/dL — ABNORMAL HIGH (ref 70–99)
Glucose-Capillary: 123 mg/dL — ABNORMAL HIGH (ref 70–99)
Glucose-Capillary: 174 mg/dL — ABNORMAL HIGH (ref 70–99)

## 2020-05-07 MED ORDER — LIDOCAINE HCL 1 % IJ SOLN
INTRAMUSCULAR | Status: AC
  Filled 2020-05-07: qty 20

## 2020-05-07 MED ORDER — CHLORHEXIDINE GLUCONATE CLOTH 2 % EX PADS
6.0000 | MEDICATED_PAD | Freq: Every day | CUTANEOUS | Status: DC
  Administered 2020-05-08 (×2): 6 via TOPICAL

## 2020-05-07 MED ORDER — SODIUM CHLORIDE 0.9% FLUSH
10.0000 mL | Freq: Two times a day (BID) | INTRAVENOUS | Status: DC
  Administered 2020-05-07: 10 mL

## 2020-05-07 MED ORDER — MIDAZOLAM HCL 2 MG/2ML IJ SOLN
INTRAMUSCULAR | Status: AC
  Filled 2020-05-07: qty 2

## 2020-05-07 MED ORDER — CHLORHEXIDINE GLUCONATE 4 % EX LIQD
CUTANEOUS | Status: AC
  Filled 2020-05-07: qty 15

## 2020-05-07 MED ORDER — SODIUM CHLORIDE 0.9% FLUSH: 10.0000 mL | INTRAVENOUS | Status: DC | PRN

## 2020-05-07 MED ORDER — CELECOXIB 200 MG PO CAPS
200.0000 mg | ORAL_CAPSULE | Freq: Two times a day (BID) | ORAL | Status: DC
  Administered 2020-05-07 – 2020-05-09 (×5): 200 mg via ORAL
  Filled 2020-05-07 (×6): qty 1

## 2020-05-07 MED ORDER — FENTANYL CITRATE (PF) 100 MCG/2ML IJ SOLN
INTRAMUSCULAR | Status: AC
  Filled 2020-05-07: qty 2

## 2020-05-07 MED ORDER — CEFAZOLIN SODIUM-DEXTROSE 2-4 GM/100ML-% IV SOLN
2.0000 g | Freq: Three times a day (TID) | INTRAVENOUS | Status: DC
  Administered 2020-05-07 – 2020-05-09 (×7): 2 g via INTRAVENOUS
  Filled 2020-05-07 (×7): qty 100

## 2020-05-07 MED ORDER — MIDAZOLAM HCL 2 MG/2ML IJ SOLN
INTRAMUSCULAR | Status: AC | PRN
  Administered 2020-05-07: 1 mg via INTRAVENOUS
  Administered 2020-05-07: 0.5 mg via INTRAVENOUS

## 2020-05-07 MED ORDER — FENTANYL CITRATE (PF) 100 MCG/2ML IJ SOLN
INTRAMUSCULAR | Status: AC | PRN
  Administered 2020-05-07: 25 ug via INTRAVENOUS
  Administered 2020-05-07: 50 ug via INTRAVENOUS
  Administered 2020-05-07 (×2): 25 ug via INTRAVENOUS

## 2020-05-07 NOTE — Consult Note (Signed)
Chief Complaint: Patient was seen in consultation today for L4-5 posterior fluid collection aspiration.  Referring Physician(s): Viona Gilmore, NP  Supervising Physician: Arne Cleveland  Patient Status: [facility] - In-pt  History of Present Illness: Alexis Choi is a 64 y.o. female with a past medical history significant for OA, chronic back pain, GERD, HLD, HTN, DM and IBS who underwent a lumbar fusion with Dr. Arnoldo Morale on 02/12/20 and developed post operative drainage. She was treated with antibiotics however she developed worsening back and left leg pain. She underwent a lumbar MRI as an outpatient which noted findings concerning for discitis, osteomyelitis, myositis and an epidural fluid collection at L4-5. She has been directly admitted to [facility] via the neurosurgery service for further management and IR has been asked to perform an aspiration of this fluid collection to further guide care.  [name] seen in IR holding today, she reports continued significant back pain. She cannot lay flat on her back due to the pain. She tells me that the pain started as soon as the drainage stopped and has worsened since then. She has also has pain in both of her legs however some days the pain is in the left leg and other days it is in the right - the pain is in her right leg today. She has had trouble walking due to pain but does not feel that her legs are weak or numb. She is a little nauseous this morning but thinks that is from not being able to eat. She states understanding of the requested procedure and is agreeable to proceed as planned.   Past Medical History:  Diagnosis Date  . Chronic back pain   . Diabetes mellitus   . Dysrhythmia    pat  . Edema    lower extremity  . Encounter for long-term (current) use of other medications   . Family history of adverse reaction to anesthesia    mother slow to wake  . Fluid retention   . GERD (gastroesophageal reflux disease)   . Hyperlipidemia     no meds  . Hypertension   . IBS (irritable bowel syndrome)   . Osteoarthritis of knee   . Pneumonia    several  . PONV (postoperative nausea and vomiting)    occ  . Unspecified vitamin D deficiency     Past Surgical History:  Procedure Laterality Date  . CHOLECYSTECTOMY N/A 06/30/2016   Procedure: LAPAROSCOPIC CHOLECYSTECTOMY;  Surgeon: Arta Bruce Kinsinger, MD;  Location: [facility];  Service: General;  Laterality: N/A;  . COLONOSCOPY  10/2020  . KNEE ARTHROSCOPY Left 06  . knee replacment Right   . right knee arthroscopy    . TONSILLECTOMY  63  . TOTAL KNEE ARTHROPLASTY Left 06/03/2013   Procedure: LEFT TOTAL KNEE ARTHROPLASTY;  Surgeon: Vickey Huger, MD;  Location: [place];  Service: Orthopedics;  Laterality: Left;  . TUBAL LIGATION  89  . UPPER GI ENDOSCOPY      Allergies: Latex, Eggs or egg-derived products, Januvia [sitagliptin], and Metformin and related  Medications: Prior to Admission medications   Medication Sig Start Date End Date Taking? Authorizing Provider  acetaminophen (TYLENOL) 650 MG CR tablet Take 1,300 mg by mouth in the morning and at bedtime.   Yes [provider]  aspirin 81 MG tablet Take 81 mg by mouth at bedtime.    Yes [provider]  b complex vitamins capsule Take 1 capsule by mouth daily.   Yes [provider]  celecoxib (  CELEBREX) 200 MG capsule Take 200 mg by mouth 2 (two) times daily.    Yes [provider]  Cholecalciferol (VITAMIN D-3) 25 MCG (1000 UT) CAPS Take 1,000 Units by mouth in the morning and at bedtime.   Yes [provider]  cyclobenzaprine (FLEXERIL) 10 MG tablet Take 1 tablet (10 mg total) by mouth 3 (three) times daily as needed for muscle spasms. Patient taking differently: Take 10 mg by mouth every 6 (six) hours.  02/13/20  Yes Newman Pies, MD  dicyclomine (BENTYL) 10 MG capsule Take 10 mg by mouth 2 (two) times daily.   Yes [provider]  docusate sodium (COLACE) 100 MG  capsule Take 1 capsule (100 mg total) by mouth 2 (two) times daily. 02/13/20  Yes Newman Pies, MD  gabapentin (NEURONTIN) 300 MG capsule Take 300 mg by mouth 3 (three) times daily.   Yes [provider]  glycerin adult 2 g suppository Place 1 suppository rectally as needed for constipation.   Yes [provider]  hyoscyamine (LEVSIN SL) 0.125 MG SL tablet Place 0.125 mg under the tongue daily as needed for cramping.  03/24/16  Yes [provider]  insulin aspart (NOVOLOG) 100 UNIT/ML injection Inject 76 Units into the skin See admin instructions. Via VGO insulin pump, up to 76 units/day   Yes [provider]  Insulin Disposable Pump (V-GO 20) KIT continuous. 05/03/20  Yes [provider]  Insulin Glargine (BASAGLAR KWIKPEN) 100 UNIT/ML Inject 50 Units into the skin daily after breakfast.   Yes [provider]  magnesium citrate SOLN Take 10 mLs by mouth once as needed for mild constipation.    Yes [provider]  Magnesium Gluconate 250 MG TABS Take 250 mg by mouth at bedtime.    Yes [provider]  Multiple Vitamin (MULTIVITAMIN) capsule Take 1 capsule by mouth daily.   Yes [provider]  nystatin cream (MYCOSTATIN) Apply 1 application topically See admin instructions. Apply to perineal area once a day   Yes [provider]  omeprazole (PRILOSEC) 20 MG capsule Take 20 mg by mouth 2 (two) times daily. 03/02/20  Yes [provider]  oxyCODONE-acetaminophen (PERCOCET/ROXICET) 5-325 MG tablet Take 1-2 tablets by mouth every 4 (four) hours as needed for moderate pain. Patient taking differently: Take 2 tablets by mouth every 6 (six) hours.  02/13/20  Yes Newman Pies, MD  Semaglutide, 1 MG/DOSE, (OZEMPIC, 1 MG/DOSE,) 2 MG/1.5ML SOPN Inject 1 mg into the skin every Sunday.    Yes [provider]  sodium chloride (MURO 128) 5 % ophthalmic ointment Place 1 application into both eyes at bedtime.    Yes [provider]  telmisartan-hydrochlorothiazide (MICARDIS HCT) 80-12.5 MG tablet Take 1 tablet by mouth daily.   Yes [provider]  verapamil (VERELAN PM) 120 MG 24 hr capsule Take 120 mg by mouth daily. 02/28/20  Yes [provider]  Insulin Pen Needle (PEN NEEDLES 31GX5/16") 31G X 8 MM MISC by Does not apply route.    [provider]  Insulin Syringe-Needle U-100 (ACCUSURE INS SYR 1CC/31GX5/16") 31G X 5/16" 1 ML MISC by Does not apply route.    [provider]     History reviewed. No pertinent family history.  Social History   Socioeconomic History  . Marital status: Married    Spouse name: Not on file  . Number of children: Not on file  . Years of education: Not on file  . Highest education level: Not  on file  Occupational History  . Not on file  Tobacco Use  . Smoking status: Never Smoker  . Smokeless tobacco: Never Used  Vaping Use  . Vaping Use: Never used  Substance and Sexual Activity  . Alcohol use: No  . Drug use: No  . Sexual activity: Not on file  Other Topics Concern  . Not on file  Social History Narrative  . Not on file   Social Determinants of Health   Financial Resource Strain:   . Difficulty of Paying Living Expenses:   Food Insecurity:   . Worried About Charity fundraiser in the Last Year:   . Arboriculturist in the Last Year:   Transportation Needs:   . Film/video editor (Medical):   Marland Kitchen Lack of Transportation (Non-Medical):   Physical Activity:   . Days of Exercise per Week:   . Minutes of Exercise per Session:   Stress:   . Feeling of Stress :   Social Connections:   . Frequency of Communication with Friends and Family:   . Frequency of Social Gatherings with Friends and Family:   . Attends Religious Services:   . Active Member of Clubs or Organizations:   . Attends Archivist Meetings:   Marland Kitchen Marital Status:      Review of Systems: A 12 point ROS discussed and pertinent  positives are indicated in the HPI above.  All other systems are negative.  Review of Systems  Constitutional: Negative for chills and fever.  Respiratory: Negative for cough and shortness of breath.   Cardiovascular: Negative for chest pain.  Gastrointestinal: Positive for nausea. Negative for abdominal pain, blood in stool, diarrhea and vomiting.  Genitourinary: Negative for dysuria and hematuria.  Musculoskeletal: Positive for back pain and gait problem.       (+) bilateral leg pain; changes sides - on right today  Neurological: Negative for dizziness, weakness, numbness and headaches.    Vital Signs: BP (!) 133/47 (BP Location: Right Arm)   Pulse 89   Temp 98.4 F (36.9 C) (Oral)   Resp 16   Ht '5\' 1"'$  (1.549 m)   Wt 245 lb 2.4 oz (111.2 kg)   LMP  (LMP Unknown)   SpO2 91%   BMI 46.32 kg/m   Physical Exam Vitals and nursing note reviewed.  Constitutional:      General: She is not in acute distress. HENT:     Head: Normocephalic.     Mouth/Throat:     Mouth: Mucous membranes are moist.     Pharynx: Oropharynx is clear. No oropharyngeal exudate or posterior oropharyngeal erythema.  Cardiovascular:     Rate and Rhythm: Normal rate and regular rhythm.  Pulmonary:     Effort: Pulmonary effort is normal.     Breath sounds: Normal breath sounds.  Abdominal:     General: There is no distension.     Palpations: Abdomen is soft.     Tenderness: There is no abdominal tenderness.  Skin:    General: Skin is warm and dry.  Neurological:     Mental Status: She is alert and oriented to person, place, and time.  Psychiatric:        Mood and Affect: Mood normal.        Behavior: Behavior normal.        Thought Content: Thought content normal.        Judgment: Judgment normal.      MD Evaluation Airway: WNL Heart:  WNL Abdomen: WNL Chest/ Lungs: WNL ASA  Classification: 3 Mallampati/Airway Score: Two   Imaging: No results found.  Labs:  CBC: Recent Labs     02/10/20 1310 02/13/20 0450 05/05/20 2156  WBC 14.8* 17.4* 16.0*  HGB 12.5 9.6* 9.0*  HCT 38.8 29.8* 29.7*  PLT 305 261 491*    COAGS: No results for input(s): INR, APTT in the last 8760 hours.  BMP: Recent Labs    02/10/20 1310 02/13/20 0450 05/05/20 2156  NA 136 136 136  K 4.6 4.3 4.7  CL 104 100 101  CO2 _0 GLUCOSE 196* 153* 149*  BUN _1 CALCIUM 9.4 8.5* 8.7*  CREATININE 0.79 0.88 0.80  GFRNONAA >60 >60 >60  GFRAA >60 >60 >60    LIVER FUNCTION TESTS: No results for input(s): BILITOT, AST, ALT, ALKPHOS, PROT, ALBUMIN in the last 8760 hours.  TUMOR MARKERS: No results for input(s): AFPTM, CEA, CA199, CHROMGRNA in the last 8760 hours.  Assessment and Plan:  64 y/o F s/p lumbar fusion 02/12/20 with neurosurgery who developed drainage, worsening back pain and left leg pain found to have small epidural fluid collection at L4-5 which IR has been asked to aspirate to further direct care. Patient has been reviewed by Dr. Vernard Gambles and approved for procedure.  Patient has been NPO since midnight, no current anticoagulation/antiplatelet medications. Afebrile, WBC 16, hgb 9.0, plt 491, blood cultures negative thus far.   Risks and benefits of L4-5 fluid collection aspiration was discussed with the patient and/or patient's family including, but not limited to bleeding, infection, damage to adjacent structures or low yield requiring additional tests.  All of the questions were answered and there is agreement to proceed.  Consent signed and in chart.    Thank you for this interesting consult.  I greatly enjoyed meeting LYNNETT LANGLINAIS and look forward to participating in their care.  A copy of this report was sent to the requesting provider on this date.  Electronically Signed: Joaquim Nam, PA-C 05/07/2020, 7:32 AM   I spent a total of 20 Minutes  in face to face in clinical consultation, greater than 50% of which was counseling/coordinating care for  L4-5 posterior fluid collection aspiration.

## 2020-05-07 NOTE — Progress Notes (Signed)
[facility]  Date of Admission:  05/05/2020      Total days of antibiotics 1  Day 1 cefazolin            ASSESSMENT: Alexis Choi is a 64 y.o. female with discitis/osteomyelitis of L4-L5 with epidural fluid collection following decompressive laminectomy / fusion in March 2021. While we await cultures from aspirate, will go ahead and start Cefazolin given she had initial improvement from cephalexin and adjust as indicated.  Inflammatory markers elevated as expected - will continue to follow for improvement.   Multiple PIV infiltrations - blood cultures negative 24 hours - OK to go ahead and place PICC line. Discussed briefly with Philis and her husband today about PICC care at home.     PLAN: 1. Start cefazolin 2 gm IV TID  2. OK to place PICC line 3. Follow micro and adjust antibiotics accordingly 4. ?if back brace would be helpful for pain control - will defer to Dr. Lovell Sheehan to ensure that is OK   Principal Problem:   Lumbar discitis Active Problems:   Spondylolisthesis of lumbar region   Type 2 diabetes mellitus without complications (HCC)   Hyperlipidemia   Hypertension   S/P lumbar fusion   Morbid obesity (HCC)   . B-complex with vitamin C  1 tablet Oral Daily  . cholecalciferol  1,000 Units Oral BID  . dicyclomine  10 mg Oral BID  . docusate sodium  100 mg Oral BID  . fluticasone  1 spray Each Nare Daily  . irbesartan  300 mg Oral Daily   Or  . hydrochlorothiazide  12.5 mg Oral Daily  . insulin aspart  0-20 Units Subcutaneous TID WC  . insulin glargine  50 Units Subcutaneous Daily  . lidocaine      . magnesium oxide  200 mg Oral Daily  . mometasone-formoterol  2 puff Inhalation BID  . multivitamin with minerals  1 tablet Oral Daily  . pantoprazole  20 mg Oral BID  . sodium chloride  1 drop Both Eyes QHS  . sodium chloride flush  3 mL Intravenous Q12H  . verapamil  120 mg Oral Daily    SUBJECTIVE: Just had CT  guided aspiration done, which was pretty painful. She has not moved much off her left side since being in the hospital.  No fevers or chills. Having some trouble with PIVs infiltrating.  Otherwise no new concerns / findings.    Review of Systems: Review of Systems  Constitutional: Negative for chills and fever.  Gastrointestinal: Negative for abdominal pain, diarrhea and vomiting.  Genitourinary: Negative for dysuria.  Musculoskeletal: Positive for back pain.  Neurological: Positive for weakness. Negative for dizziness, focal weakness and headaches.  All other systems reviewed and are negative.   Allergies  Allergen Reactions  . Latex Itching, Rash and Other (See Comments)    NO Band-Aids!!  . Eggs Or Egg-Derived Products Nausea And Vomiting  . Januvia [Sitagliptin] Palpitations and Rash  . Metformin And Related Itching, Palpitations and Rash    OBJECTIVE: Vitals:   05/07/20 0915 05/07/20 0920 05/07/20 0935 05/07/20 0950  BP: (!) 142/66 (!) 147/58 (!) 148/49 (!) 117/39  Pulse: (!) 122 (!) 121 (!) 123 90  Resp: 16 14 16 17   Temp:    98.4 F (36.9 C)  TempSrc:    Oral  SpO2: 97% 97% 95% 94%  Weight:      Height:  Body mass index is 46.32 kg/m.  Physical Exam Constitutional:      Appearance: Normal appearance.     Comments: Resting in bed on the left side. Appears pretty uncomfortable.   HENT:     Mouth/Throat:     Mouth: Mucous membranes are moist.     Pharynx: Oropharynx is clear.  Cardiovascular:     Rate and Rhythm: Regular rhythm. Tachycardia present.  Pulmonary:     Effort: Pulmonary effort is normal.     Breath sounds: Normal breath sounds.  Abdominal:     General: Abdomen is flat.     Palpations: Abdomen is soft.  Musculoskeletal:        General: Tenderness (back) present.  Skin:    General: Skin is warm and dry.     Capillary Refill: Capillary refill takes less than 2 seconds.  Neurological:     Mental Status: She is alert and oriented to  person, place, and time.     Lab Results Lab Results  Component Value Date   WBC 16.0 (H) 05/05/2020   HGB 9.0 (L) 05/05/2020   HCT 29.7 (L) 05/05/2020   MCV 80.7 05/05/2020   PLT 491 (H) 05/05/2020    Lab Results  Component Value Date   CREATININE 0.80 05/05/2020   BUN 19 05/05/2020   NA 136 05/05/2020   K 4.7 05/05/2020   CL 101 05/05/2020   CO2 23 05/05/2020    Lab Results  Component Value Date   ALT 23 05/27/2013   AST 24 05/27/2013   ALKPHOS 66 05/27/2013   BILITOT 0.3 05/27/2013     Microbiology: Recent Results (from the past 240 hour(s))  Culture, blood (routine x 2)     Status: None (Preliminary result)   Collection Time: 05/05/20  9:45 PM   Specimen: BLOOD  Result Value Ref Range Status   Specimen Description BLOOD RIGHT ANTECUBITAL  Final   Special Requests   Final    BOTTLES DRAWN AEROBIC ONLY Blood Culture adequate volume   Culture   Final    NO GROWTH 2 DAYS Performed at [facility], [address] [place], [place] [address]    Report Status PENDING  Incomplete  Culture, blood (routine x 2)     Status: None (Preliminary result)   Collection Time: 05/05/20  9:56 PM   Specimen: BLOOD RIGHT HAND  Result Value Ref Range Status   Specimen Description BLOOD RIGHT HAND  Final   Special Requests   Final    BOTTLES DRAWN AEROBIC ONLY Blood Culture adequate volume   Culture   Final    NO GROWTH 2 DAYS Performed at [facility], [place] [address] [place], [place] [address]    Report Status PENDING  Incomplete    Janene Madeira, MSN, NP-C [email or web address] Pager: [phone] Office: [phone] [place]: [phone]

## 2020-05-07 NOTE — Progress Notes (Signed)
Called for Val Eagle, requesting diet.   Awaiting call back.

## 2020-05-07 NOTE — Progress Notes (Signed)
Peripherally Inserted Central Catheter Placement  The IV Nurse has discussed with the patient and/or persons authorized to consent for the patient, the purpose of this procedure and the potential benefits and risks involved with this procedure.  The benefits include less needle sticks, lab draws from the catheter, and the patient may be discharged home with the catheter. Risks include, but not limited to, infection, bleeding, blood clot (thrombus formation), and puncture of an artery; nerve damage and irregular heartbeat and possibility to perform a PICC exchange if needed/ordered by physician.  Alternatives to this procedure were also discussed.  Bard Power PICC patient education guide, fact sheet on infection prevention and patient information card has been provided to patient /or left at bedside.    PICC Placement Documentation  PICC Single Lumen 05/07/20 PICC Right Cephalic 39 cm 0 cm (Active)  Indication for Insertion or Continuance of Line Home intravenous therapies (PICC only) 05/07/20 2053  Exposed Catheter (cm) 0 cm 05/07/20 2053  Site Assessment Clean;Dry;Intact 05/07/20 2053  Line Status Flushed;Saline locked;Blood return noted 05/07/20 2053  Dressing Type Transparent 05/07/20 2053  Dressing Status Clean;Dry;Intact;Antimicrobial disc in place 05/07/20 2053  Safety Lock Not Applicable 05/07/20 2053  Dressing Change Due 05/14/20 05/07/20 2053       Ethelda Chick 05/07/2020, 8:54 PM

## 2020-05-07 NOTE — Progress Notes (Signed)
   Providing Compassionate, Quality Care - Together   Subjective: Patient reports she just returned from IR for the CT guided aspiration. Her husband is at the bedside.  Objective: Vital signs in last 24 hours: Temp:  [98.2 F (36.8 C)-98.9 F (37.2 C)] 98.4 F (36.9 C) (06/17 0950) Pulse Rate:  [79-124] 90 (06/17 0950) Resp:  [14-18] 17 (06/17 0950) BP: (117-149)/(39-79) 117/39 (06/17 0950) SpO2:  [91 %-99 %] 94 % (06/17 0950)  Intake/Output from previous day: 06/16 0701 - 06/17 0700 In: 240 [P.O.:240] Out: -  Intake/Output this shift: No intake/output data recorded.  Alert and oriented x 4 PERRLA CN II-XII grossly intact MAE, Strength and sensation intact Incision  is clean, dry, and intact; Band-aid over aspiration site   Lab Results: Recent Labs    05/05/20 2156  WBC 16.0*  HGB 9.0*  HCT 29.7*  PLT 491*   BMET Recent Labs    05/05/20 2156  NA 136  K 4.7  CL 101  CO2 23  GLUCOSE 149*  BUN 19  CREATININE 0.80  CALCIUM 8.7*    Studies/Results: No results found.  Assessment/Plan: Patient underwent a lumbar fusion by Dr. Lovell Sheehan approximately 2 months ago. She developed drainage and was treated with Keflex empirically and drainage resolved. Patient's pain worsened and follow up imaging revealed findings consistent with discitis, osteomyelitis, myositis, and a small epidural fluid collection. Patient underwent a CT guided aspiration on 05/07/2020. Her blood cultures drawn on 05/05/2020 show no growth.   LOS: 2 days    -PICC line ordered -Antibiotic per ID once aspiration culture results -Patient's home Celebrex ordered   Val Eagle, DNP, AGNP-C Nurse Practitioner  [facility] [address], [place], [place] [address] P: [phone]    F: [phone]  05/07/2020, 10:41 AM

## 2020-05-07 NOTE — Procedures (Signed)
  Procedure: CT aspiration Lumbar paraspinal 80ml cloudy thin yellow for GS, C&S EBL:   minimal Complications:  none immediate  See full dictation in [employer].  Thora Lance MD Main # [phone] Pager  [phone]

## 2020-05-07 NOTE — Plan of Care (Signed)

## 2020-05-07 NOTE — Plan of Care (Signed)
  Problem: Education: Goal: Knowledge of General Education information will improve Description: Including pain rating scale, medication(s)/side effects and non-pharmacologic comfort measures 05/07/2020 0813 by Lenise Arena, RN Outcome: Progressing 05/07/2020 0813 by Lenise Arena, RN Outcome: Progressing   Problem: Health Behavior/Discharge Planning: Goal: Ability to manage health-related needs will improve 05/07/2020 0813 by Lenise Arena, RN Outcome: Progressing 05/07/2020 0813 by Lenise Arena, RN Outcome: Progressing   Problem: Clinical Measurements: Goal: Ability to maintain clinical measurements within normal limits will improve 05/07/2020 0813 by Lenise Arena, RN Outcome: Progressing 05/07/2020 0813 by Lenise Arena, RN Outcome: Progressing Goal: Will remain free from infection 05/07/2020 0813 by Lenise Arena, RN Outcome: Progressing 05/07/2020 0813 by Lenise Arena, RN Outcome: Progressing Goal: Diagnostic test results will improve 05/07/2020 0813 by Lenise Arena, RN Outcome: Progressing 05/07/2020 0813 by Lenise Arena, RN Outcome: Progressing Goal: Respiratory complications will improve 05/07/2020 0813 by Lenise Arena, RN Outcome: Progressing 05/07/2020 0813 by Lenise Arena, RN Outcome: Progressing Goal: Cardiovascular complication will be avoided 05/07/2020 0813 by Lenise Arena, RN Outcome: Progressing 05/07/2020 0813 by Lenise Arena, RN Outcome: Progressing   Problem: Activity: Goal: Risk for activity intolerance will decrease 05/07/2020 0813 by Lenise Arena, RN Outcome: Progressing 05/07/2020 0813 by Lenise Arena, RN Outcome: Progressing   Problem: Nutrition: Goal: Adequate nutrition will be maintained 05/07/2020 0813 by Lenise Arena, RN Outcome: Progressing 05/07/2020 0813 by Lenise Arena, RN Outcome: Progressing   Problem: Coping: Goal: Level of anxiety will decrease 05/07/2020 0813 by Lenise Arena, RN Outcome: Progressing 05/07/2020 0813 by Lenise Arena, RN Outcome: Progressing   Problem: Elimination: Goal: Will not experience complications related to bowel motility 05/07/2020 0813 by Lenise Arena, RN Outcome: Progressing 05/07/2020 0813 by Lenise Arena, RN Outcome: Progressing Goal: Will not experience complications related to urinary retention 05/07/2020 0813 by Lenise Arena, RN Outcome: Progressing 05/07/2020 0813 by Lenise Arena, RN Outcome: Progressing   Problem: Pain Managment: Goal: General experience of comfort will improve 05/07/2020 0813 by Lenise Arena, RN Outcome: Progressing 05/07/2020 0813 by Lenise Arena, RN Outcome: Progressing   Problem: Safety: Goal: Ability to remain free from injury will improve 05/07/2020 0813 by Lenise Arena, RN Outcome: Progressing 05/07/2020 0813 by Lenise Arena, RN Outcome: Progressing   Problem: Skin Integrity: Goal: Risk for impaired skin integrity will decrease 05/07/2020 0813 by Lenise Arena, RN Outcome: Progressing 05/07/2020 0813 by Lenise Arena, RN Outcome: Progressing

## 2020-05-08 LAB — GLUCOSE, CAPILLARY
Glucose-Capillary: 102 mg/dL — ABNORMAL HIGH (ref 70–99)
Glucose-Capillary: 208 mg/dL — ABNORMAL HIGH (ref 70–99)
Glucose-Capillary: 142 mg/dL — ABNORMAL HIGH (ref 70–99)
Glucose-Capillary: 176 mg/dL — ABNORMAL HIGH (ref 70–99)

## 2020-05-08 MED ORDER — RIFAMPIN 300 MG PO CAPS
300.0000 mg | ORAL_CAPSULE | Freq: Two times a day (BID) | ORAL | Status: DC
  Administered 2020-05-08 – 2020-05-09 (×2): 300 mg via ORAL
  Filled 2020-05-08 (×3): qty 1

## 2020-05-08 NOTE — Progress Notes (Signed)
Providing Compassionate, Quality Care - Together   Subjective: Patient reports no significant change in her pain level overnight.  Objective: Vital signs in last 24 hours: Temp:  [97.7 F (36.5 C)-98.6 F (37 C)] 97.7 F (36.5 C) (06/18 0755) Pulse Rate:  [93-100] 99 (06/18 0755) Resp:  [15-16] 15 (06/18 0755) BP: (116-135)/(44-63) 125/63 (06/18 0755) SpO2:  [96 %-100 %] 100 % (06/18 0755)  Intake/Output from previous day: 06/17 0701 - 06/18 0700 In: 340 [P.O.:340] Out: -  Intake/Output this shift: Total I/O In: 240 [P.O.:240] Out: -   Alert and oriented x 4 PERRLA CN II-XII grossly intact MAE, Strength and sensation intact Incision  is clean, dry, and intact    Lab Results: Recent Labs    05/05/20 2156  WBC 16.0*  HGB 9.0*  HCT 29.7*  PLT 491*   BMET Recent Labs    05/05/20 2156  NA 136  K 4.7  CL 101  CO2 23  GLUCOSE 149*  BUN 19  CREATININE 0.80  CALCIUM 8.7*    Studies/Results: CT ASPIRATION  Result Date: 05/07/2020 CLINICAL DATA:  Back pain. Previous laminectomy and PLIF from L3 through L5. There is diffuse edema and enhancement within the soft tissues surrounding the operative levels, including the psoas and erector spinae muscles. Small amount of nonspecific fluid within the laminectomy bed. In addition, there is low-level marrow edema and enhancement within the L4 and L5 vertebral bodies. Infection cannot be excluded. EXAM: CT GUIDED ASPIRATION BIOPSY OF  LUMBAR PARASPINAL COLLECTION ANESTHESIA/SEDATION: Intravenous Fentanyl 113mcg and Versed 1.5mg  were administered as conscious sedation during continuous monitoring of the patient's level of consciousness and physiological / cardiorespiratory status by the radiology RN, with a total moderate sedation time of 20 minutes. PROCEDURE: The procedure risks, benefits, and alternatives were explained to the patient. Questions regarding the procedure were encouraged and answered. The patient understands  and consents to the procedure. Patient placed LAO and select axial scans through the lumbar region obtained. The collection was localized based on direct comparison to recent MRI and an appropriate skin entry site determined and marked. The operative field was prepped with chlorhexidinein a sterile fashion, and a sterile drape was applied covering the operative field. A sterile gown and sterile gloves were used for the procedure. Local anesthesia was provided with 1% Lidocaine. Under intermittent CT fluoroscopic guidance, 18 gauge trocar needle advanced into the posterior paraspinal collection in the midline. 15 mL thin cloudy yellow fluid aspirated, sent for Gram stain and culture. Postprocedure scans show no hemorrhage or other apparent complication. The patient tolerated the procedure well. COMPLICATIONS: None immediate FINDINGS: The posterior paraspinal fluid collection was localized based on direct comparison to previous MRI. 15 mL cloudy thin fluid were aspirated. IMPRESSION: 1. Technically successful CT-guided aspiration of lumbar paravertebral collection. Electronically Signed   By: Lucrezia Europe M.D.   On: 05/07/2020 11:06   [place] EKG SITE RITE  Result Date: 05/07/2020 If Site Rite image not attached, placement could not be confirmed due to current cardiac rhythm.   Assessment/Plan: Patient underwent a lumbar fusion by Dr. Arnoldo Morale approximately 2 months ago. She developed drainage and was treated with Keflex empirically and drainage resolved. Patient's pain worsened and follow up imaging revealed findings consistent with discitis, osteomyelitis, myositis, and a small epidural fluid collection. Patient underwent a CT guided aspiration on 05/07/2020. Her blood cultures drawn on 05/05/2020 show no growth. Epidural fluid show gram positive cocci. ID following for antibiotic recommendations.   LOS: 3 days    -  Mobilize patient -TOC consulted for HH arrangements   Val Eagle, DNP, AGNP-C Nurse  Practitioner  [facility] [address], [address], [place], [place] [address] P: [phone]     F: [phone]  05/08/2020, 10:52 AM

## 2020-05-08 NOTE — TOC Initial Note (Signed)
Transition of Care [facility]) - Initial/Assessment Note    Patient Details  Name: Alexis Choi MRN: [id number] Date of Birth: [birth year]  [facility] CM/SW Contact:    Curlene Labrum, RN Phone Number: 05/08/2020, 1:17 PM  Clinical Narrative:                 Case management met with the patient regarding transitions to home care.  Carolynn Sayers, [employer], is planning to see the patient today for infusion teaching and PICC line care.  The patient states that she had [facility] prior to her admission to the hospital for PT services and will need continued [facility] services  - with Alvis Lemmings at patient's request.  I ordered PT/RN [facility] services and followed up with Carron Brazen, NP with neurosurgery and notified her of the discharge care plans.  Home Health placed in the patient's instructions.  Expected Discharge Plan: [place] Barriers to Discharge: No Barriers Identified   Patient Goals and CMS Choice Patient states their goals for this hospitalization and ongoing recovery are:: Patient states that she is ready to feel better and go home tomorrow. CMS Medicare.gov Compare Post Acute Care list provided to:: Patient Choice offered to / list presented to : Patient  Expected Discharge Plan and Services Expected Discharge Plan: [place]   Discharge Planning Services: CM Consult Post Acute Care Choice: Durable Medical Equipment, Home Health Living arrangements for the past 2 months: Single Family Home                 DME Arranged:  (Patient currently has Conservation officer, nature and cane - does not need a 3:1 per the patient.) DME Agency: [place] Arranged: PT, RN [facility] Agency: [place] Date [place]: 05/08/20 Time [place]: [id number] Representative spoke with at [place]: cory, [facility]  Prior Living Arrangements/Services Living arrangements for the past 2 months: [place] Lives with::  Spouse Patient language and need for interpreter reviewed:: Yes Do you feel safe going back to the place where you live?: Yes      Need for Family Participation in Patient Care: Yes (Comment) Care giver support system in place?: Yes (comment) Current home services: DME Criminal Activity/Legal Involvement Pertinent to Current Situation/Hospitalization: No - Comment as needed  Activities of Daily Living Home Assistive Devices/Equipment: Cane (specify quad or straight), Walker (specify type) ADL Screening (condition at time of admission) Patient's cognitive ability adequate to safely complete daily activities?: Yes Is the patient deaf or have difficulty hearing?: No Does the patient have difficulty seeing, even when wearing glasses/contacts?: No Does the patient have difficulty concentrating, remembering, or making decisions?: No Patient able to express need for assistance with ADLs?: Yes Does the patient have difficulty dressing or bathing?: No Independently performs ADLs?: Yes (appropriate for developmental age) Does the patient have difficulty walking or climbing stairs?: No Weakness of Legs: None Weakness of Arms/Hands: None  Permission Sought/Granted Permission sought to share information with : Case Manager Permission granted to share information with : Yes, Verbal Permission Granted     Permission granted to share info w AGENCY: [place] granted to share info w Relationship: husband     Emotional Assessment Appearance:: Appears stated age Attitude/Demeanor/Rapport: Engaged Affect (typically observed): Accepting Orientation: : Oriented to Self, Oriented to Place, Oriented to  Time, Oriented to Situation Alcohol / Substance Use: Not Applicable Psych Involvement:  No (comment)  Admission diagnosis:  Lumbar discitis [M46.46] Patient Active Problem List   Diagnosis Date Noted  . Type 2 diabetes mellitus without complications ([place]) 97/35/3299  . Hyperlipidemia  05/06/2020  . Hypertension 05/06/2020  . S/P lumbar fusion 05/06/2020  . Morbid obesity ([place]) 05/06/2020  . Lumbar discitis 05/05/2020  . Spondylolisthesis of lumbar region 02/12/2020   PCP:  Serita Grammes, MD Pharmacy:   [id number] [place] [id number]: [phone]: [phone]    Social Determinants of Health (SDOH) Interventions    Readmission Risk Interventions Readmission Risk Prevention Plan 05/08/2020  Post Dischage Appt Complete  Medication Screening Complete  Transportation Screening Complete  Some recent data might be hidden

## 2020-05-08 NOTE — Progress Notes (Signed)
[facility]  Date of Admission:  05/05/2020      Total days of antibiotics 2  Day 2 cefazolin            ASSESSMENT: Alexis Choi is a 64 y.o. female with discitis/osteomyelitis of L4-L5 with epidural fluid collection following decompressive laminectomy / fusion in March 2021. 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 Will add rifampin 300 mg BID for synergy and hardware presence. Counseled on possible side effects. Continue cefazolin for now - will ask home health to run possibility of daptomycin IV should we need anti-MRSA coverage. PICC in good position. Still with pain as expected but hopeful it will start to improve over the next 1-2 weeks.   Only drug interaction is with her verapamil - may make this less effective with BP control. To monitor.   Hopeful we will have an ID on bacteria in the morning. Dr. Ninetta Lights will follow micro and adjust antibiotics as needed. He can be reached at [phone].   Will schedule her with virtual appointment in 3 weeks with Rexene Alberts, NP July 9th @ 9:30 am.     PLAN: 1. Continue cefazolin 2 gm IV TID  2. Follow micro and adjust antibiotics accordingly 3. Add rifampin 300 mg BID  4. FU appointment has been arranged    Principal Problem:   Lumbar discitis Active Problems:   Spondylolisthesis of lumbar region   Type 2 diabetes mellitus without complications (HCC)   Hyperlipidemia   Hypertension   S/P lumbar fusion   Morbid obesity (HCC)   . B-complex with vitamin C  1 tablet Oral Daily  . celecoxib  200 mg Oral BID  . Chlorhexidine Gluconate Cloth  6 each Topical Daily  . cholecalciferol  1,000 Units Oral BID  . dicyclomine  10 mg Oral BID  . docusate sodium  100 mg Oral BID  . fluticasone  1 spray Each Nare Daily  . irbesartan  300 mg Oral Daily   Or  . hydrochlorothiazide  12.5 mg Oral Daily  . insulin aspart  0-20 Units Subcutaneous TID WC  . insulin glargine  50  Units Subcutaneous Daily  . magnesium oxide  200 mg Oral Daily  . multivitamin with minerals  1 tablet Oral Daily  . pantoprazole  20 mg Oral BID  . rifampin  300 mg Oral BID WC  . sodium chloride  1 drop Both Eyes QHS  . sodium chloride flush  10-40 mL Intracatheter Q12H  . sodium chloride flush  3 mL Intravenous Q12H  . verapamil  120 mg Oral Daily    SUBJECTIVE: PICC line in place - went smoothly and no problems.  Feeling the same with regards to pain.  Tolerating antibiotics well without concern.     Review of Systems: Review of Systems  Constitutional: Negative for chills and fever.  Gastrointestinal: Negative for abdominal pain, diarrhea and vomiting.  Genitourinary: Negative for dysuria.  Musculoskeletal: Positive for back pain.  Neurological: Positive for weakness. Negative for dizziness, focal weakness and headaches.  All other systems reviewed and are negative.   Allergies  Allergen Reactions  . Latex Itching, Rash and Other (See Comments)    NO Band-Aids!!  . Eggs Or Egg-Derived Products Nausea And Vomiting  . Januvia [Sitagliptin] Palpitations and Rash  . Metformin And Related Itching, Palpitations and Rash    OBJECTIVE: Vitals:   05/07/20 1330 05/07/20 2105 05/08/20 0325 05/08/20 0755  BP: (!) 135/44 (!) 116/53 (!) 126/55 125/63  Pulse: 100 94 93 99  Resp: 15 16 16 15   Temp: 98.4 F (36.9 C) 98.6 F (37 C) 98.1 F (36.7 C) 97.7 F (36.5 C)  TempSrc: Oral Oral Oral Oral  SpO2: 97% 100% 96% 100%  Weight:      Height:       Body mass index is 46.32 kg/m.  Physical Exam Constitutional:      Appearance: Normal appearance.     Comments: Resting in bed on the left side. Appears uncomfortable.   HENT:     Mouth/Throat:     Mouth: Mucous membranes are moist.     Pharynx: Oropharynx is clear.  Cardiovascular:     Rate and Rhythm: Normal rate and regular rhythm.  Pulmonary:     Effort: Pulmonary effort is normal.     Breath sounds: Normal breath  sounds.  Abdominal:     General: Abdomen is flat.     Palpations: Abdomen is soft.  Skin:    General: Skin is warm and dry.     Capillary Refill: Capillary refill takes less than 2 seconds.  Neurological:     Mental Status: She is alert and oriented to person, place, and time.     Lab Results Lab Results  Component Value Date   WBC 16.0 (H) 05/05/2020   HGB 9.0 (L) 05/05/2020   HCT 29.7 (L) 05/05/2020   MCV 80.7 05/05/2020   PLT 491 (H) 05/05/2020    Lab Results  Component Value Date   CREATININE 0.80 05/05/2020   BUN 19 05/05/2020   NA 136 05/05/2020   K 4.7 05/05/2020   CL 101 05/05/2020   CO2 23 05/05/2020    Lab Results  Component Value Date   ALT 23 05/27/2013   AST 24 05/27/2013   ALKPHOS 66 05/27/2013   BILITOT 0.3 05/27/2013     Microbiology: Recent Results (from the past 240 hour(s))  Culture, blood (routine x 2)     Status: None (Preliminary result)   Collection Time: 05/05/20  9:45 PM   Specimen: BLOOD  Result Value Ref Range Status   Specimen Description BLOOD RIGHT ANTECUBITAL  Final   Special Requests   Final    BOTTLES DRAWN AEROBIC ONLY Blood Culture adequate volume   Culture   Final    NO GROWTH 3 DAYS Performed at [facility], [address], [place], [place]    Report Status PENDING  Incomplete  Culture, blood (routine x 2)     Status: None (Preliminary result)   Collection Time: 05/05/20  9:56 PM   Specimen: BLOOD RIGHT HAND  Result Value Ref Range Status   Specimen Description BLOOD RIGHT HAND  Final   Special Requests   Final    BOTTLES DRAWN AEROBIC ONLY Blood Culture adequate volume   Culture   Final    NO GROWTH 3 DAYS Performed at [facility], [address] [place], [place]    Report Status PENDING  Incomplete  Aerobic/Anaerobic Culture (surgical/deep wound)     Status: None (Preliminary result)   Collection Time: 05/07/20 10:23 AM   Specimen: Abscess  Result Value Ref Range Status    Specimen Description ABSCESS  Final   Special Requests ASPIRATE  Final   Gram Stain   Final    ABUNDANT WBC PRESENT, PREDOMINANTLY PMN RARE GRAM POSITIVE COCCI    Culture   Final    CULTURE  REINCUBATED FOR BETTER GROWTH Performed at [facility], [place] [address] [place], [place] [address]    Report Status PENDING  Incomplete    Janene Madeira, MSN, NP-C [facility] [email or web address] Pager: [phone] Office: [phone] [place]: [phone]

## 2020-05-08 NOTE — Evaluation (Signed)
Physical Therapy Evaluation & Discharge Patient Details Name: Alexis Choi MRN: [id number] DOB: 08-Nov-1956 Today's Date: 05/08/2020   History of Present Illness  64 y.o. female admitted for back and L leg pain. MRI 05/06/20 discitis, osteomyelitis myositis and a small epidural fluid collection at L4-5. CT aspiration lumbar paraspinal 05/07/20. PMH Lumbar fusion L3-L5 01/2020, DMII, HTN, L TKA  Clinical Impression  Pt presents with no major decreases in functional mobility secondary to above. PTA, pt lives with husband in one story home. Today, pt able to complete transfers and amb Mod(I). Pt reports she has been up and moving around room without assistance with no issue. Reeducated on spinal precautions for comfort and use of RW for energy conservation. Pt appropriate to d/c from physical therapy acutely from mobility stand point.     Follow Up Recommendations No PT follow up    Equipment Recommendations  None recommended by PT    Recommendations for Other Services       Precautions / Restrictions Precautions Precautions: Back Precaution Comments: for comfort      Mobility  Bed Mobility               General bed mobility comments: received patient from transfering off commode with daughter.  Transfers Overall transfer level: Modified independent Equipment used: Rolling walker (2 wheeled)             General transfer comment: Pt reports she has been getting out of bed and transfering with and without RW in hospital. Pt reports she gets more faitgued transfering without RW.  Ambulation/Gait Ambulation/Gait assistance: Modified independent (Device/Increase time) Gait Distance (Feet): 170 Feet Assistive device: Rolling walker (2 wheeled) Gait Pattern/deviations: Step-through pattern;Decreased stride length;Wide base of support Gait velocity: decreased   General Gait Details: Pt able to amb 140 feet without fatiguing, pt required no rest breaks and was able to  make it back to room.  Stairs            Wheelchair Mobility    Modified Rankin (Stroke Patients Only)       Balance Overall balance assessment: No apparent balance deficits (not formally assessed)                                           Pertinent Vitals/Pain Pain Assessment: Faces Faces Pain Scale: Hurts a little bit Pain Location: Back Pain Descriptors / Indicators: Grimacing;Discomfort Pain Intervention(s): Limited activity within patient's tolerance;Monitored during session;Premedicated before session    Home Living Family/patient expects to be discharged to:: Private residence Living Arrangements: Spouse/significant other Available Help at Discharge: Family;Available 24 hours/day Type of Home: House Home Access: Stairs to enter   [employer] of Steps: 5 Home Layout: One level Home Equipment: Walker - 2 wheels;Walker - 4 wheels;Bedside commode;Electric scooter;Adaptive equipment      Prior Function Level of Independence: Independent with assistive device(s)         Comments: using cane and RW since last surgery and having outside assist for IADLs     Hand Dominance   Dominant Hand: Right    Extremity/Trunk Assessment   Upper Extremity Assessment Upper Extremity Assessment: Overall WFL for tasks assessed    Lower Extremity Assessment Lower Extremity Assessment: Overall WFL for tasks assessed    Cervical / Trunk Assessment Cervical / Trunk Assessment: Kyphotic  Communication   Communication: No difficulties  Cognition Arousal/Alertness: Awake/alert Behavior  During Therapy: WFL for tasks assessed/performed Overall Cognitive Status: Within Functional Limits for tasks assessed                                        General Comments General comments (skin integrity, edema, etc.): skin integ WNL, pt family in room and supportive, reminded pt of back precautions ands she was able to verblize.     Exercises     Assessment/Plan    PT Assessment Patent does not need any further PT services  PT Problem List         PT Treatment Interventions      PT Goals (Current goals can be found in the Care Plan section)  Acute Rehab PT Goals PT Goal Formulation: All assessment and education complete, DC therapy    Frequency     Barriers to discharge        Co-evaluation               AM-PAC PT "6 Clicks" Mobility  Outcome Measure Help needed turning from your back to your side while in a flat bed without using bedrails?: None Help needed moving from lying on your back to sitting on the side of a flat bed without using bedrails?: None Help needed moving to and from a bed to a chair (including a wheelchair)?: None Help needed standing up from a chair using your arms (e.g., wheelchair or bedside chair)?: None Help needed to walk in hospital room?: None Help needed climbing 3-5 steps with a railing? : None 6 Click Score: 24    End of Session Equipment Utilized During Treatment: Gait belt Activity Tolerance: Patient tolerated treatment well Patient left: in bed;with call bell/phone within reach;with family/visitor present Nurse Communication: Mobility status      Time: 1455-1506 PT Time Calculation (min) (ACUTE ONLY): 11 min   Charges:   PT Evaluation $PT Eval Low Complexity: 1 Low          Star Cheese SPT 05/08/2020   Sanjuana Letters 05/08/2020, 5:37 PM

## 2020-05-08 NOTE — Progress Notes (Addendum)
Patient changed her insulin pump out this evening. Encouraged not to bolus herself, instead to allow [place] to continue treating her blood sugars in order to reduce risk of double dosing/hypoglycemia.

## 2020-05-09 DIAGNOSIS — I10 Essential (primary) hypertension: Secondary | ICD-10-CM

## 2020-05-09 DIAGNOSIS — A4901 Methicillin susceptible Staphylococcus aureus infection, unspecified site: Secondary | ICD-10-CM

## 2020-05-09 DIAGNOSIS — Z981 Arthrodesis status: Secondary | ICD-10-CM

## 2020-05-09 DIAGNOSIS — E785 Hyperlipidemia, unspecified: Secondary | ICD-10-CM

## 2020-05-09 LAB — GLUCOSE, CAPILLARY
Glucose-Capillary: 131 mg/dL — ABNORMAL HIGH (ref 70–99)
Glucose-Capillary: 135 mg/dL — ABNORMAL HIGH (ref 70–99)

## 2020-05-09 MED ORDER — HEPARIN SOD (PORK) LOCK FLUSH 100 UNIT/ML IV SOLN
250.0000 [IU] | INTRAVENOUS | Status: AC | PRN
  Administered 2020-05-09: 250 [IU]
  Filled 2020-05-09: qty 2.5

## 2020-05-09 MED ORDER — CEFAZOLIN IV (FOR PTA / DISCHARGE USE ONLY)
2.0000 g | Freq: Three times a day (TID) | INTRAVENOUS | 0 refills | Status: DC
Start: 2020-05-09 — End: 2020-06-17

## 2020-05-09 MED ORDER — RIFAMPIN 300 MG PO CAPS: 300.0000 mg | ORAL_CAPSULE | Freq: Two times a day (BID) | ORAL | 1 refills | Status: DC

## 2020-05-09 MED ORDER — CEFAZOLIN SODIUM-DEXTROSE 2-4 GM/100ML-% IV SOLN: 2.0000 g | Freq: Three times a day (TID) | INTRAVENOUS | 0 refills | Status: DC

## 2020-05-09 NOTE — Progress Notes (Addendum)
INFECTIOUS DISEASE PROGRESS NOTE  ID: Alexis Choi is a 64 y.o. female with  Principal Problem:   Lumbar discitis Active Problems:   Spondylolisthesis of lumbar region   Type 2 diabetes mellitus without complications (HCC)   Hyperlipidemia   Hypertension   S/P lumbar fusion   Morbid obesity (HCC)  Subjective: C/o swelling pain, last pm, of back wound.   Abtx:  Anti-infectives (From admission, onward)   Start     Dose/Rate Route Frequency Ordered Stop   05/09/20 0000  ceFAZolin (ANCEF) 2-4 GM/100ML-% IVPB     Discontinue     2 g Intravenous Every 8 hours 05/09/20 0956     05/09/20 0000  rifampin (RIFADIN) 300 MG capsule     Discontinue     300 mg Oral 2 times daily with meals 05/09/20 0956     05/08/20 1700  rifampin (RIFADIN) capsule 300 mg     Discontinue     300 mg Oral 2 times daily with meals 05/08/20 0930     05/07/20 1130  ceFAZolin (ANCEF) IVPB 2g/100 mL premix     Discontinue     2 g 200 mL/hr over 30 Minutes Intravenous Every 8 hours 05/07/20 1043        Medications:  Scheduled: . B-complex with vitamin C  1 tablet Oral Daily  . celecoxib  200 mg Oral BID  . Chlorhexidine Gluconate Cloth  6 each Topical Daily  . cholecalciferol  1,000 Units Oral BID  . dicyclomine  10 mg Oral BID  . docusate sodium  100 mg Oral BID  . irbesartan  300 mg Oral Daily   Or  . hydrochlorothiazide  12.5 mg Oral Daily  . insulin aspart  0-20 Units Subcutaneous TID WC  . insulin glargine  50 Units Subcutaneous Daily  . magnesium oxide  200 mg Oral Daily  . multivitamin with minerals  1 tablet Oral Daily  . pantoprazole  20 mg Oral BID  . rifampin  300 mg Oral BID WC  . sodium chloride  1 drop Both Eyes QHS  . sodium chloride flush  10-40 mL Intracatheter Q12H  . sodium chloride flush  3 mL Intravenous Q12H  . verapamil  120 mg Oral Daily    Objective: Vital signs in last 24 hours: Temp:  [97.7 F (36.5 C)-98.2 F (36.8 C)] 97.7 F (36.5 C) (06/19 0807) Pulse  Rate:  [86-101] 92 (06/19 0807) Resp:  [16] 16 (06/19 0807) BP: (124-136)/(44-66) 134/44 (06/19 0807) SpO2:  [96 %-98 %] 96 % (06/19 0807)   General appearance: alert, cooperative and no distress Resp: clear to auscultation bilaterally Cardio: regular rate and rhythm GI: normal findings: bowel sounds normal and soft, non-tender Skin: mild erythema around her lumbar wound. RUE PIC is clean.   Lab Results No results for input(s): WBC, HGB, HCT, NA, K, CL, CO2, BUN, CREATININE, GLU in the last 72 hours.  Invalid input(s): PLATELETS Liver Panel No results for input(s): PROT, ALBUMIN, AST, ALT, ALKPHOS, BILITOT, BILIDIR, IBILI in the last 72 hours. Sedimentation Rate Recent Labs    05/06/20 1508  ESRSEDRATE 111*   C-Reactive Protein Recent Labs    05/07/20 0457  CRP 20.2*    Microbiology: Recent Results (from the past 240 hour(s))  Culture, blood (routine x 2)     Status: None (Preliminary result)   Collection Time: 05/05/20  9:45 PM   Specimen: BLOOD  Result Value Ref Range Status   Specimen Description BLOOD RIGHT ANTECUBITAL  Final   Special Requests   Final    BOTTLES DRAWN AEROBIC ONLY Blood Culture adequate volume   Culture   Final    NO GROWTH 4 DAYS Performed at [facility], [address] [place], [place] [address]    Report Status PENDING  Incomplete  Culture, blood (routine x 2)     Status: None (Preliminary result)   Collection Time: 05/05/20  9:56 PM   Specimen: BLOOD RIGHT HAND  Result Value Ref Range Status   Specimen Description BLOOD RIGHT HAND  Final   Special Requests   Final    BOTTLES DRAWN AEROBIC ONLY Blood Culture adequate volume   Culture   Final    NO GROWTH 4 DAYS Performed at [facility], [place] [address], [place], [place] [address]    Report Status PENDING  Incomplete  Aerobic/Anaerobic Culture (surgical/deep wound)     Status: None (Preliminary result)   Collection Time: 05/07/20 10:23 AM   Specimen: Abscess  Result  Value Ref Range Status   Specimen Description ABSCESS  Final   Special Requests ASPIRATE  Final   Gram Stain   Final    ABUNDANT WBC PRESENT, PREDOMINANTLY PMN RARE GRAM POSITIVE COCCI Performed at [facility], [place] [address], [place], [place] [address]    Culture MODERATE STAPHYLOCOCCUS AUREUS  Final   Report Status PENDING  Incomplete    Studies/Results: No results found.   Assessment/Plan: L4-5 Discitis/Osteo Fusion/laminectomy 01-2020.  MSSA DM2 Morbid Obesity  Total days of antibiotics: 3 ancef  Her Cx is MSSA, sens to Ox Going home with ancef She has f/u appt with NP Dixon in our clinic (this is confirmed with pt and she puts info into her phone) Given precautions about wound care, PIC care, possible ADR of anbx.  FSG 131-208         Bobby Rumpf MD, FACP Infectious Diseases (pager) [phone] [email or web address] 05/09/2020, 12:02 PM  LOS: 4 days

## 2020-05-09 NOTE — TOC Transition Note (Signed)
Transition of Care [facility]) - CM/SW Discharge Note   Patient Details  Name: Alexis Choi MRN: [id number] Date of Birth: [birth year]  [facility]) CM/SW Contact:  Deveron Furlong, RN 05/09/2020, 11:00am  Clinical Narrative:    Patient to d/c home with [facility] and IV antibiotics.  Per Pam with [facility] infusion and Cory with [facility], patient is able to independently administer antibiotics tonight and will have first [facility] visit tomorrow mornings.  Patient has had two doses today and can be discharged with plan to give 3rd dose tonight around 8pm.  RN is aware.    Final next level of care: Home w Home Health Services Barriers to Discharge: No Barriers Identified   Patient Goals and CMS Choice Patient states their goals for this hospitalization and ongoing recovery are:: Patient states that she is ready to feel better and go home tomorrow. CMS Medicare.gov Compare Post Acute Care list provided to:: Patient Choice offered to / list presented to : Patient  Discharge Placement                       Discharge Plan and Services   Discharge Planning Services: CM Consult Post Acute Care Choice: Durable Medical Equipment, Home Health          DME Arranged:  (Patient currently has Rolling Walker and cane - does not need a 3:1 per the patient.) DME Agency: [facility]       HH Arranged: PT, RN [facility] Agency: [facility] Date [facility] Agency Contacted: 05/08/20 Time HH Agency Contacted: [phone] Representative spoke with at [facility] Agency: cory, [facility]  Social Determinants of Health (SDOH) Interventions     Readmission Risk Interventions Readmission Risk Prevention Plan 05/08/2020  Post Dischage Appt Complete  Medication Screening Complete  Transportation Screening Complete  Some recent data might be hidden

## 2020-05-09 NOTE — Discharge Summary (Addendum)
Physician Discharge Summary  Patient ID: Alexis Choi MRN: [id number] DOB/AGE: March 07, 1956 64 y.o.  Admit date: 05/05/2020 Discharge date: 05/09/2020  Admission Diagnoses: discitis, osteomyelitis   Discharge Diagnoses: same   Discharged Condition: good  Hospital Course: The patient was admitted on 05/05/2020.  The hospital course was routine. There were no complications. The wound remained clean dry and intact. Pt had appropriate backsoreness. complaints of leg  pain or but no new N/T/W. The patient remained afebrile with stable vital signs, and tolerated a regular diet. The patient continued to increase activities, and pain was well controlled with oral pain medications.   Consults: ID  Significant Diagnostic Studies:  Results for orders placed or performed during the hospital encounter of 05/05/20  Culture, blood (routine x 2)   Specimen: BLOOD  Result Value Ref Range   Specimen Description BLOOD RIGHT ANTECUBITAL    Special Requests      BOTTLES DRAWN AEROBIC ONLY Blood Culture adequate volume   Culture      NO GROWTH 4 DAYS Performed at [facility] [address], [place], [place] [address]    Report Status PENDING   Culture, blood (routine x 2)   Specimen: BLOOD RIGHT HAND  Result Value Ref Range   Specimen Description BLOOD RIGHT HAND    Special Requests      BOTTLES DRAWN AEROBIC ONLY Blood Culture adequate volume   Culture      NO GROWTH 4 DAYS Performed at [facility], [place] [address], [place], [place] [address]    Report Status PENDING   Aerobic/Anaerobic Culture (surgical/deep wound)   Specimen: Abscess  Result Value Ref Range   Specimen Description ABSCESS    Special Requests ASPIRATE    Gram Stain      ABUNDANT WBC PRESENT, PREDOMINANTLY PMN RARE GRAM POSITIVE COCCI    Culture MODERATE STAPHYLOCOCCUS AUREUS    Report Status PENDING    Organism ID, Bacteria STAPHYLOCOCCUS AUREUS       Susceptibility   Staphylococcus aureus - MIC*     CIPROFLOXACIN <=0.5 SENSITIVE Sensitive     ERYTHROMYCIN >=8 RESISTANT Resistant     GENTAMICIN <=0.5 SENSITIVE Sensitive     OXACILLIN 0.5 SENSITIVE Sensitive     TETRACYCLINE <=1 SENSITIVE Sensitive     VANCOMYCIN 2 SENSITIVE Sensitive     TRIMETH/SULFA <=10 SENSITIVE Sensitive     CLINDAMYCIN RESISTANT Resistant     RIFAMPIN <=0.5 SENSITIVE Sensitive     Inducible Clindamycin Value in next row Resistant      POSITIVEPerformed at [facility] [address] [place], [place] [address]    * MODERATE STAPHYLOCOCCUS AUREUS  HIV Antibody (routine testing w rflx)  Result Value Ref Range   HIV Screen 4th Generation wRfx Non Reactive Non Reactive  Basic metabolic panel  Result Value Ref Range   Sodium 136 135 - 145 mmol/L   Potassium 4.7 3.5 - 5.1 mmol/L   Chloride 101 98 - 111 mmol/L   CO2 23 22 - 32 mmol/L   Glucose, Bld 149 (H) 70 - 99 mg/dL   BUN 19 8 - 23 mg/dL   Creatinine, Ser 0.80 0.44 - 1.00 mg/dL   Calcium 8.7 (L) 8.9 - 10.3 mg/dL   GFR calc non Af Amer >60 >60 mL/min   GFR calc Af Amer >60 >60 mL/min   Anion gap 12 5 - 15  CBC  Result Value Ref Range   WBC 16.0 (H) 4.0 - 10.5 K/uL  RBC 3.68 (L) 3.87 - 5.11 MIL/uL   Hemoglobin 9.0 (L) 12.0 - 15.0 g/dL   HCT 29.7 (L) 36 - 46 %   MCV 80.7 80.0 - 100.0 fL   MCH 24.5 (L) 26.0 - 34.0 pg   MCHC 30.3 30.0 - 36.0 g/dL   RDW 16.3 (H) 11.5 - 15.5 %   Platelets 491 (H) 150 - 400 K/uL   nRBC 0.0 0.0 - 0.2 %  Hemoglobin A1c  Result Value Ref Range   Hgb A1c MFr Bld 7.3 (H) 4.8 - 5.6 %   Mean Plasma Glucose 163 mg/dL  Glucose, capillary  Result Value Ref Range   Glucose-Capillary 143 (H) 70 - 99 mg/dL  Glucose, capillary  Result Value Ref Range   Glucose-Capillary 123 (H) 70 - 99 mg/dL  Glucose, capillary  Result Value Ref Range   Glucose-Capillary 117 (H) 70 - 99 mg/dL  Glucose, capillary  Result Value Ref Range   Glucose-Capillary 104 (H) 70 - 99 mg/dL  Sedimentation rate  Result Value Ref Range   Sed Rate  111 (H) 0 - 22 mm/hr  Glucose, capillary  Result Value Ref Range   Glucose-Capillary 174 (H) 70 - 99 mg/dL  C-reactive protein  Result Value Ref Range   CRP 20.2 (H) <1.0 mg/dL  Glucose, capillary  Result Value Ref Range   Glucose-Capillary 133 (H) 70 - 99 mg/dL  Glucose, capillary  Result Value Ref Range   Glucose-Capillary 164 (H) 70 - 99 mg/dL  Glucose, capillary  Result Value Ref Range   Glucose-Capillary 132 (H) 70 - 99 mg/dL  Glucose, capillary  Result Value Ref Range   Glucose-Capillary 123 (H) 70 - 99 mg/dL  Glucose, capillary  Result Value Ref Range   Glucose-Capillary 174 (H) 70 - 99 mg/dL  Glucose, capillary  Result Value Ref Range   Glucose-Capillary 102 (H) 70 - 99 mg/dL  Glucose, capillary  Result Value Ref Range   Glucose-Capillary 208 (H) 70 - 99 mg/dL  Glucose, capillary  Result Value Ref Range   Glucose-Capillary 142 (H) 70 - 99 mg/dL  Glucose, capillary  Result Value Ref Range   Glucose-Capillary 176 (H) 70 - 99 mg/dL  Glucose, capillary  Result Value Ref Range   Glucose-Capillary 131 (H) 70 - 99 mg/dL    CT ASPIRATION  Result Date: 05/07/2020 CLINICAL DATA:  Back pain. Previous laminectomy and PLIF from L3 through L5. There is diffuse edema and enhancement within the soft tissues surrounding the operative levels, including the psoas and erector spinae muscles. Small amount of nonspecific fluid within the laminectomy bed. In addition, there is low-level marrow edema and enhancement within the L4 and L5 vertebral bodies. Infection cannot be excluded. EXAM: CT GUIDED ASPIRATION BIOPSY OF  LUMBAR PARASPINAL COLLECTION ANESTHESIA/SEDATION: Intravenous Fentanyl 178mg and Versed 1.'5mg'$  were administered as conscious sedation during continuous monitoring of the patient's level of consciousness and physiological / cardiorespiratory status by the radiology RN, with a total moderate sedation time of 20 minutes. PROCEDURE: The procedure risks, benefits, and  alternatives were explained to the patient. Questions regarding the procedure were encouraged and answered. The patient understands and consents to the procedure. Patient placed LAO and select axial scans through the lumbar region obtained. The collection was localized based on direct comparison to recent MRI and an appropriate skin entry site determined and marked. The operative field was prepped with chlorhexidinein a sterile fashion, and a sterile drape was applied covering the operative field. A sterile gown and sterile gloves  were used for the procedure. Local anesthesia was provided with 1% Lidocaine. Under intermittent CT fluoroscopic guidance, 18 gauge trocar needle advanced into the posterior paraspinal collection in the midline. 15 mL thin cloudy yellow fluid aspirated, sent for Gram stain and culture. Postprocedure scans show no hemorrhage or other apparent complication. The patient tolerated the procedure well. COMPLICATIONS: None immediate FINDINGS: The posterior paraspinal fluid collection was localized based on direct comparison to previous MRI. 15 mL cloudy thin fluid were aspirated. IMPRESSION: 1. Technically successful CT-guided aspiration of lumbar paravertebral collection. Electronically Signed   By: Lucrezia Europe M.D.   On: 05/07/2020 11:06   [place] EKG SITE RITE  Result Date: 05/07/2020 If Site Rite image not attached, placement could not be confirmed due to current cardiac rhythm.   Antibiotics:  Anti-infectives (From admission, onward)   Start     Dose/Rate Route Frequency Ordered Stop   05/09/20 0000  ceFAZolin (ANCEF) 2-4 GM/100ML-% IVPB     Discontinue     2 g Intravenous Every 8 hours 05/09/20 0956     05/09/20 0000  rifampin (RIFADIN) 300 MG capsule     Discontinue     300 mg Oral 2 times daily with meals 05/09/20 0956     05/09/20 0000  ceFAZolin (ANCEF) IVPB     Discontinue     2 g Intravenous Every 8 hours 05/09/20 1214 06/20/20 2359   05/08/20 1700  rifampin (RIFADIN)  capsule 300 mg     Discontinue     300 mg Oral 2 times daily with meals 05/08/20 0930     05/07/20 1130  ceFAZolin (ANCEF) IVPB 2g/100 mL premix     Discontinue     2 g 200 mL/hr over 30 Minutes Intravenous Every 8 hours 05/07/20 1043        Discharge Exam: Blood pressure (!) 134/44, pulse 92, temperature 97.7 F (36.5 C), temperature source Oral, resp. rate 16, height '5\' 1"'$  (1.549 m), weight 111.2 kg, SpO2 96 %. Neurologic: Grossly normal Ambulating and voiding well.  Discharge Medications:   Allergies as of 05/09/2020      Reactions   Latex Itching, Rash, Other (See Comments)   NO Band-Aids!!   Eggs Or Egg-derived Products Nausea And Vomiting   Januvia [sitagliptin] Palpitations, Rash   Metformin And Related Itching, Palpitations, Rash      Medication List    TAKE these medications   ACCUSURE INS SYR 1CC/31GX5/16" 31G X 5/16" 1 ML Misc Generic drug: Insulin Syringe-Needle U-100 by Does not apply route.   acetaminophen 650 MG CR tablet Commonly known as: TYLENOL Take 1,300 mg by mouth in the morning and at bedtime.   aspirin 81 MG tablet Take 81 mg by mouth at bedtime.   b complex vitamins capsule Take 1 capsule by mouth daily.   Basaglar KwikPen 100 UNIT/ML Inject 50 Units into the skin daily after breakfast.   ceFAZolin  IVPB Commonly known as: ANCEF Inject 2 g into the vein every 8 (eight) hours. Indication:  osteomyelitis First Dose: Yes Last Day of Therapy:  06/20/2020 Labs - Once weekly:  CBC/D and BMP, Labs - Every other week:  ESR and CRP Method of administration: IV Push Method of administration may be changed at the discretion of home infusion pharmacist based upon assessment of the patient and/or caregiver's ability to self-administer the medication ordered.   ceFAZolin 2-4 GM/100ML-% IVPB Commonly known as: ANCEF Inject 100 mLs (2 g total) into the vein every 8 (eight) hours.  celecoxib 200 MG capsule Commonly known as: CELEBREX Take 200 mg by  mouth 2 (two) times daily.   cyclobenzaprine 10 MG tablet Commonly known as: FLEXERIL Take 1 tablet (10 mg total) by mouth 3 (three) times daily as needed for muscle spasms. What changed: when to take this   dicyclomine 10 MG capsule Commonly known as: BENTYL Take 10 mg by mouth 2 (two) times daily.   docusate sodium 100 MG capsule Commonly known as: COLACE Take 1 capsule (100 mg total) by mouth 2 (two) times daily.   gabapentin 300 MG capsule Commonly known as: NEURONTIN Take 300 mg by mouth 3 (three) times daily.   glycerin adult 2 g suppository Place 1 suppository rectally as needed for constipation.   hyoscyamine 0.125 MG SL tablet Commonly known as: LEVSIN SL Place 0.125 mg under the tongue daily as needed for cramping.   insulin aspart 100 UNIT/ML injection Commonly known as: novoLOG Inject 76 Units into the skin See admin instructions. Via VGO insulin pump, up to 76 units/day   magnesium citrate Soln Take 10 mLs by mouth once as needed for mild constipation.   Magnesium Gluconate 250 MG Tabs Take 250 mg by mouth at bedtime.   multivitamin capsule Take 1 capsule by mouth daily.   Muro 128 5 % ophthalmic ointment Generic drug: sodium chloride Place 1 application into both eyes at bedtime.   nystatin cream Commonly known as: MYCOSTATIN Apply 1 application topically See admin instructions. Apply to perineal area once a day   omeprazole 20 MG capsule Commonly known as: PRILOSEC Take 20 mg by mouth 2 (two) times daily.   oxyCODONE-acetaminophen 5-325 MG tablet Commonly known as: PERCOCET/ROXICET Take 1-2 tablets by mouth every 4 (four) hours as needed for moderate pain. What changed:   how much to take  when to take this   Ozempic (1 MG/DOSE) 2 MG/1.5ML Sopn Generic drug: Semaglutide (1 MG/DOSE) Inject 1 mg into the skin every 'Sunday.   PEN NEEDLES 31GX5/16" 31G X 8 MM Misc by Does not apply route.   rifampin 300 MG capsule Commonly known as:  RIFADIN Take 1 capsule (300 mg total) by mouth 2 (two) times daily with a meal.   telmisartan-hydrochlorothiazide 80-12.5 MG tablet Commonly known as: MICARDIS HCT Take 1 tablet by mouth daily.   V-Go 20 Kit continuous.   verapamil 120 MG 24 hr capsule Commonly known as: VERELAN PM Take 120 mg by mouth daily.   Vitamin D-3 25 MCG (1000 UT) Caps Take 1,000 Units by mouth in the morning and at bedtime.            Discharge Care Instructions  (From admission, onward)         Start     Ordered   05/09/20 0000  Change dressing on IV access line weekly and PRN  (Home infusion instructions - [facility] )        05/09/20 0956   05/09/20 0000  Change dressing on IV access line weekly and PRN  (Home infusion instructions - [facility] )        06'$ /19/21 1214          Disposition: home   Final Dx: discitis, osteomyelitis  Discharge Instructions    [facility] pharmacist to adjust dose for Vancomycin, Aminoglycosides and other anti-infective therapies as requested by physician.   Complete by: As directed    [facility] pharmacist to adjust dose for Vancomycin, Aminoglycosides and other anti-infective therapies  as requested by physician.   Complete by: As directed    [facility] to provide Cath Flo '2mg'$    Complete by: As directed    Administer for PICC line occlusion and as ordered by physician for other access device issues.   [facility] to provide Cath Flo '2mg'$    Complete by: As directed    Administer for PICC line occlusion and as ordered by physician for other access device issues.   Anaphylaxis Kit: Provided to treat any anaphylactic reaction to the medication being provided to the patient if First Dose or when requested by physician   Complete by: As directed    Epinephrine '1mg'$ /ml vial / amp: Administer 0.'3mg'$  (0.30m) subcutaneously once for moderate to severe anaphylaxis, nurse to call physician and  pharmacy when reaction occurs and call 911 if needed for immediate care   Diphenhydramine '50mg'$ /ml IV vial: Administer 25-'50mg'$  IV/IM PRN for first dose reaction, rash, itching, mild reaction, nurse to call physician and pharmacy when reaction occurs   Sodium Chloride 0.9% NS 50712mIV: Administer if needed for hypovolemic blood pressure drop or as ordered by physician after call to physician with anaphylactic reaction   Anaphylaxis Kit: Provided to treat any anaphylactic reaction to the medication being provided to the patient if First Dose or when requested by physician   Complete by: As directed    Epinephrine '1mg'$ /ml vial / amp: Administer 0.'3mg'$  (0.12m51msubcutaneously once for moderate to severe anaphylaxis, nurse to call physician and pharmacy when reaction occurs and call 911 if needed for immediate care   Diphenhydramine '50mg'$ /ml IV vial: Administer 25-'50mg'$  IV/IM PRN for first dose reaction, rash, itching, mild reaction, nurse to call physician and pharmacy when reaction occurs   Sodium Chloride 0.9% NS 500m52m: Administer if needed for hypovolemic blood pressure drop or as ordered by physician after call to physician with anaphylactic reaction   Call MD for:  difficulty breathing, headache or visual disturbances   Complete by: As directed    Call MD for:  hives   Complete by: As directed    Call MD for:  persistant dizziness or light-headedness   Complete by: As directed    Call MD for:  persistant nausea and vomiting   Complete by: As directed    Call MD for:  redness, tenderness, or signs of infection (pain, swelling, redness, odor or green/yellow discharge around incision site)   Complete by: As directed    Call MD for:  severe uncontrolled pain   Complete by: As directed    Call MD for:  temperature >100.4   Complete by: As directed    Change dressing on IV access line weekly and PRN   Complete by: As directed    Change dressing on IV access line weekly and PRN   Complete by: As  directed    Diet - low sodium heart healthy   Complete by: As directed    Flush IV access with Sodium Chloride 0.9% and Heparin 10 units/ml or 100 units/ml   Complete by: As directed    Flush IV access with Sodium Chloride 0.9% and Heparin 10 units/ml or 100 units/ml   Complete by: As directed    Home infusion instructions - [facility]   Complete by: As directed    Instructions: Flush IV access with Sodium Chloride 0.9% and Heparin 10units/ml or 100units/ml   Change dressing on IV access line: Weekly and PRN   Instructions Cath Flo '2mg'$ : Administer for PICC  Line occlusion and as ordered by physician for other access device   [facility] pharmacist to adjust dose for: Vancomycin, Aminoglycosides and other anti-infective therapies as requested by physician   Home infusion instructions - [facility]   Complete by: As directed    Instructions: Flush IV access with Sodium Chloride 0.9% and Heparin 10units/ml or 100units/ml   Change dressing on IV access line: Weekly and PRN   Instructions Cath Flo '2mg'$ : Administer for PICC Line occlusion and as ordered by physician for other access device   [facility] pharmacist to adjust dose for: Vancomycin, Aminoglycosides and other anti-infective therapies as requested by physician   Increase activity slowly   Complete by: As directed    Method of administration may be changed at the discretion of home infusion pharmacist based upon assessment of the patient and/or caregiver's ability to self-administer the medication ordered   Complete by: As directed    Method of administration may be changed at the discretion of home infusion pharmacist based upon assessment of the patient and/or caregiver's ability to self-administer the medication ordered   Complete by: As directed    No wound care   Complete by: As directed    Outpatient Parenteral Antibiotic Therapy Information Antibiotic: Cefazolin (Ancef) IVPB;  Indications for use: osteomyelitis; End Date: 06/20/2020   Complete by: As directed    Antibiotic: Cefazolin (Ancef) IVPB   Indications for use: osteomyelitis   End Date: 06/20/2020       Follow-up Information    [facility] Follow up on 05/29/2020.   Specialty: Infectious Diseases Why: Video Visit with Janene Madeira, NP @ 9:30 a- please e-check in online prior to appointment. You will receive a call shortly before your appointment to review your medications. Happy to change to in person visit if you prefe Contact information: [place], [place] [id number] [place] [phone]       [facility] Follow up.   Why: Carolynn Sayers, RNCM will be seeing you for PICC line and antibiotic teaching prior to discharge.       [facility] Follow up.   Specialty: Home Health Services Why: Alvis Lemmings will be seeing you for home health services for physical therapy and registered nurse for IV care and teaching once you are discharged from the hospital. Contact information: [address] [place] [address] [phone]                Signed: Ocie Cornfield [facility] 05/09/2020, 12:14 PM

## 2020-05-09 NOTE — Evaluation (Signed)
Occupational Therapy Evaluation Patient Details Name: Alexis Choi MRN: [id number] DOB: [birth year] Today's Date: 05/09/2020    History of Present Illness 64 y.o. female admitted for back and L leg pain. MRI 05/06/20 discitis, osteomyelitis myositis and a small epidural fluid collection at L4-5. CT aspiration lumbar paraspinal 05/07/20. PMH Lumbar fusion L3-L5 01/2020, DMII, HTN, L TKA   Clinical Impression   PTA pt living with spouse, functioning at mod I level for BADL/IADL. She reports using either cane or RW for mobility depending on pain and if she is going longer distances. At time of eval, pt is able to complete mobility with mod I level of independence. She is very familiar with AE from previous back surgery. She has a Secondary school teacher, sock aide, and toilet aide at home. Pt also has a shower seat. She is familiar with back precautions for comfort. No further OT needs identified, OT will sign off. Thank you for this consult.     Follow Up Recommendations  No OT follow up    Equipment Recommendations  None recommended by OT    Recommendations for Other Services       Precautions / Restrictions Precautions Precautions: Back Precaution Comments: for comfort Restrictions Weight Bearing Restrictions: No      Mobility Bed Mobility Overal bed mobility: Modified Independent                Transfers Overall transfer level: Modified independent Equipment used: Rolling walker (2 wheeled)                  Balance Overall balance assessment: No apparent balance deficits (not formally assessed)                                         ADL either performed or assessed with clinical judgement   ADL Overall ADL's : Modified independent                                       General ADL Comments: Pt demonstrates ability to complete BADL at mod I level. Pt is familiar with use of reacher, sock aide, and toilet aide as needed and has all of  these items at home. She uses reacher in hospital room as well. She is very understanding of spinal precautions for comfort and how to apply to BADL     Vision Baseline Vision/History: Wears glasses Wears Glasses: At all times Patient Visual Report: No change from baseline       Perception     Praxis      Pertinent Vitals/Pain Pain Assessment: Faces Faces Pain Scale: Hurts a little bit Pain Location: Back Pain Descriptors / Indicators: Grimacing;Discomfort Pain Intervention(s): Monitored during session;Repositioned     Hand Dominance     Extremity/Trunk Assessment Upper Extremity Assessment Upper Extremity Assessment: Overall WFL for tasks assessed   Lower Extremity Assessment Lower Extremity Assessment: Overall WFL for tasks assessed       Communication Communication Communication: No difficulties   Cognition Arousal/Alertness: Awake/alert Behavior During Therapy: WFL for tasks assessed/performed Overall Cognitive Status: Within Functional Limits for tasks assessed  General Comments       Exercises     Shoulder Instructions      Home Living Family/patient expects to be discharged to:: Private residence Living Arrangements: Spouse/significant other Available Help at Discharge: Family;Available 24 hours/day Type of Home: House Home Access: Stairs to enter [employer] of Steps: 5   Home Layout: One level     Bathroom Shower/Tub: Chief Strategy Officer: Handicapped height     Home Equipment: Environmental consultant - 2 wheels;Walker - 4 wheels;Bedside commode;Electric scooter;Adaptive equipment Adaptive Equipment: Reacher;Sock aid;Other (Comment) (toilet aide)        Prior Functioning/Environment Level of Independence: Independent with assistive device(s)        Comments: using cane and RW since last surgery and having outside assist for IADLs        OT Problem List: Decreased knowledge  of use of DME or AE;Decreased knowledge of precautions;Decreased activity tolerance      OT Treatment/Interventions: Self-care/ADL training;Therapeutic exercise;Patient/family education;Balance training;Energy conservation;Therapeutic activities;DME and/or AE instruction    OT Goals(Current goals can be found in the care plan section) Acute Rehab OT Goals Patient Stated Goal: return to independence OT Goal Formulation: All assessment and education complete, DC therapy  OT Frequency: Min 2X/week   Barriers to D/C:            Co-evaluation              AM-PAC OT "6 Clicks" Daily Activity     Outcome Measure Help from another person eating meals?: None Help from another person taking care of personal grooming?: None Help from another person toileting, which includes using toliet, bedpan, or urinal?: None Help from another person bathing (including washing, rinsing, drying)?: None Help from another person to put on and taking off regular upper body clothing?: None Help from another person to put on and taking off regular lower body clothing?: None 6 Click Score: 24   End of Session Equipment Utilized During Treatment: Rolling walker Nurse Communication: Mobility status  Activity Tolerance: Patient tolerated treatment well Patient left: in bed;with call bell/phone within reach  OT Visit Diagnosis: Other abnormalities of gait and mobility (R26.89)                Time: 1050-1100 OT Time Calculation (min): 10 min Charges:  OT General Charges $OT Visit: 1 Visit OT Evaluation $OT Eval Low Complexity: 1 Low  Dalphine Handing, MSOT, OTR/L Acute Rehabilitation Services [facility] Office Number: [phone] Pager: [phone]  Dalphine Handing 05/09/2020, 2:20 PM

## 2020-05-09 NOTE — Progress Notes (Signed)
PHARMACY CONSULT NOTE FOR:  OUTPATIENT  PARENTERAL ANTIBIOTIC THERAPY (OPAT)  Indication: osteomyelitis Regimen: cefazolin 2 gm IV q8h End date: 06/20/2020  IV antibiotic discharge orders are pended. To discharging provider:  please sign these orders via discharge navigator,  Select New Orders & click on the button choice - Manage This Unsigned Work.     Thank you for allowing pharmacy to be a part of this patient's care.  Lulu Riding, PharmD PGY1 Pharmacy Resident  Please check AMION for all [facility] Pharmacy phone numbers After 10:00 PM, call Main Pharmacy [phone]  05/09/2020, 10:06 AM

## 2020-05-10 LAB — CULTURE, BLOOD (ROUTINE X 2)
Culture: NO GROWTH
Culture: NO GROWTH
Special Requests: ADEQUATE
Special Requests: ADEQUATE

## 2020-05-12 LAB — AEROBIC/ANAEROBIC CULTURE W GRAM STAIN (SURGICAL/DEEP WOUND)

## 2020-05-29 ENCOUNTER — Other Ambulatory Visit: Payer: Self-pay

## 2020-05-29 ENCOUNTER — Telehealth (INDEPENDENT_AMBULATORY_CARE_PROVIDER_SITE_OTHER): Payer: BC Managed Care – PPO | Admitting: Infectious Diseases

## 2020-05-29 DIAGNOSIS — M4646 Discitis, unspecified, lumbar region: Secondary | ICD-10-CM

## 2020-05-29 DIAGNOSIS — Z452 Encounter for adjustment and management of vascular access device: Secondary | ICD-10-CM | POA: Diagnosis not present

## 2020-05-29 DIAGNOSIS — Z792 Long term (current) use of antibiotics: Secondary | ICD-10-CM

## 2020-05-30 DIAGNOSIS — Z792 Long term (current) use of antibiotics: Secondary | ICD-10-CM | POA: Insufficient documentation

## 2020-05-30 DIAGNOSIS — Z452 Encounter for adjustment and management of vascular access device: Secondary | ICD-10-CM | POA: Insufficient documentation

## 2020-05-30 NOTE — Assessment & Plan Note (Signed)
She is having some findings c/w mild contact dermatitis possibly due to CHG rinse or sorbaview dressing. I asked her to please discuss with [facility]'s team to see if there is a sensitive kit option for her to use.  If rash develops would d/c CHG rinse / biopatch use.  Keep in place until next appt.

## 2020-05-30 NOTE — Assessment & Plan Note (Signed)
No labs from [place] - will try to track them down.  Should be weekly CMP, CBC, ESR and CRP.

## 2020-05-30 NOTE — Progress Notes (Signed)
Patient: Alexis Choi  DOB: 28-Apr-1956 MRN: [id number] PCP: Serita Grammes, MD   VIRTUAL CARE ENCOUNTER  I connected with Kelli Hope Authier on 05/30/20 at  9:30 AM EDT by VIDEO and verified that I am speaking with the correct person using two identifiers.   I discussed the limitations, risks, security and privacy concerns of performing an evaluation and management service by telephone and the availability of in person appointments. I also discussed with the patient that there may be a patient responsible charge related to this service. The patient expressed understanding and agreed to proceed.  Patient Location: [place] residence   Other Participants: Eve, CMA  Provider Location: [facility] Office    Chief Complaint  Patient presents with  . Hospitalization Follow-up    Has concerns about the clorahexazine dressing, wants to know what to be expecting at points in getting rid of her infection.     Patient Active Problem List   Diagnosis Date Noted  . Lumbar discitis 05/05/2020    Priority: High  . PICC (peripherally inserted central catheter) in place 05/30/2020  . Long term (current) use of antibiotics 05/30/2020  . Type 2 diabetes mellitus without complications ([place]) 07/04/4817  . Hyperlipidemia 05/06/2020  . Hypertension 05/06/2020  . S/P lumbar fusion 05/06/2020  . Morbid obesity ([place]) 05/06/2020  . Spondylolisthesis of lumbar region 02/12/2020     Subjective:  Alexis Choi is a 64 y.o. with MSSA lumbar spine discitis involving L4-L5 following decompressive laminectomy and fusion in March 2021 with secondary myositis and epidural fluid collection. She was not bacteremic at presentation to the hospital back in June 2021. She was started on Cefazolin IV and Rifampin PO following percutaneous aspiration of spinous muscle abscess. She is about 3 week into treatment and feels a bit better. Last night she was able to sleep a little better than she had in the past few  weeks; first time she did not experience any shooting debilitating posterior leg pain. She has not had any today either. Her incision has healed over and there is no redness at all per her husband's assessment. She has not been back to see NSGY team yet but has an appointment soon. She was using her TLSO brace but this has recently been more cumbersome and in the way for her. She is working on core and pelvic floor strengthening with physical therapy at home.   PICC line is without pain, drainage or erythema and is well maintained by [facility] Team. No swelling or altered sensation in affected distal extremity. She is having some itching under the dressing that she wonders is due to the Chlorhexidine rinse. She does not have a rash to the location at this time but just itching occasionally.     Review of Systems  Constitutional: Negative for chills and fever.  Eyes: Negative for blurred vision and photophobia.  Respiratory: Negative for cough and sputum production.   Cardiovascular: Negative for chest pain.  Gastrointestinal: Negative for diarrhea, nausea and vomiting.  Genitourinary: Negative for dysuria.  Musculoskeletal: Positive for back pain.  Skin: Positive for itching. Negative for rash.  Neurological: Negative for headaches.    Past Medical History:  Diagnosis Date  . Chronic back pain   . Diabetes mellitus   . Dysrhythmia    pat  . Edema    lower extremity  . Encounter for long-term (current) use of other medications   . Family history of adverse reaction to anesthesia  mother slow to wake  . Fluid retention   . GERD (gastroesophageal reflux disease)   . Hyperlipidemia    no meds  . Hypertension   . IBS (irritable bowel syndrome)   . Osteoarthritis of knee   . Pneumonia    several  . PONV (postoperative nausea and vomiting)    occ  . Unspecified vitamin D deficiency     Outpatient Medications Prior to Visit  Medication Sig Dispense Refill  . acetaminophen  (TYLENOL) 650 MG CR tablet Take 1,300 mg by mouth in the morning and at bedtime.    Marland Kitchen aspirin 81 MG tablet Take 81 mg by mouth at bedtime.     Marland Kitchen b complex vitamins capsule Take 1 capsule by mouth daily.    Marland Kitchen ceFAZolin (ANCEF) 2-4 GM/100ML-% IVPB Inject 100 mLs (2 g total) into the vein every 8 (eight) hours. 1 each 0  . ceFAZolin (ANCEF) IVPB Inject 2 g into the vein every 8 (eight) hours. Indication:  osteomyelitis First Dose: Yes Last Day of Therapy:  06/20/2020 Labs - Once weekly:  CBC/D and BMP, Labs - Every other week:  ESR and CRP Method of administration: IV Push Method of administration may be changed at the discretion of home infusion pharmacist based upon assessment of the patient and/or caregiver's ability to self-administer the medication ordered. 126 Units 0  . celecoxib (CELEBREX) 200 MG capsule Take 200 mg by mouth 2 (two) times daily.     . Cholecalciferol (VITAMIN D-3) 25 MCG (1000 UT) CAPS Take 1,000 Units by mouth in the morning and at bedtime.    . cyclobenzaprine (FLEXERIL) 10 MG tablet Take 1 tablet (10 mg total) by mouth 3 (three) times daily as needed for muscle spasms. (Patient taking differently: Take 10 mg by mouth every 6 (six) hours. ) 50 tablet 1  . dicyclomine (BENTYL) 10 MG capsule Take 10 mg by mouth 2 (two) times daily.    Marland Kitchen docusate sodium (COLACE) 100 MG capsule Take 1 capsule (100 mg total) by mouth 2 (two) times daily. 60 capsule 0  . gabapentin (NEURONTIN) 300 MG capsule Take 300 mg by mouth 3 (three) times daily.    . hyoscyamine (LEVSIN SL) 0.125 MG SL tablet Place 0.125 mg under the tongue daily as needed for cramping.     . insulin aspart (NOVOLOG) 100 UNIT/ML injection Inject 76 Units into the skin See admin instructions. Via VGO insulin pump, up to 76 units/day    . Insulin Disposable Pump (V-GO 20) KIT continuous.    . Insulin Glargine (BASAGLAR KWIKPEN) 100 UNIT/ML Inject 50 Units into the skin daily after breakfast.    . Insulin Pen Needle (PEN  NEEDLES 31GX5/16") 31G X 8 MM MISC by Does not apply route.    . Insulin Syringe-Needle U-100 (ACCUSURE INS SYR 1CC/31GX5/16") 31G X 5/16" 1 ML MISC by Does not apply route.    . magnesium citrate SOLN Take 10 mLs by mouth once as needed for mild constipation.     . Magnesium Gluconate 250 MG TABS Take 250 mg by mouth at bedtime.     . Multiple Vitamin (MULTIVITAMIN) capsule Take 1 capsule by mouth daily.    Marland Kitchen nystatin cream (MYCOSTATIN) Apply 1 application topically See admin instructions. Apply to perineal area once a day    . omeprazole (PRILOSEC) 20 MG capsule Take 20 mg by mouth 2 (two) times daily.    Marland Kitchen oxyCODONE-acetaminophen (PERCOCET/ROXICET) 5-325 MG tablet Take 1-2 tablets by mouth every 4 (  four) hours as needed for moderate pain. (Patient taking differently: Take 2 tablets by mouth every 6 (six) hours. ) 50 tablet 0  . rifampin (RIFADIN) 300 MG capsule Take 1 capsule (300 mg total) by mouth 2 (two) times daily with a meal. 30 capsule 1  . Semaglutide, 1 MG/DOSE, (OZEMPIC, 1 MG/DOSE,) 2 MG/1.5ML SOPN Inject 1 mg into the skin every Sunday.     . sodium chloride (MURO 128) 5 % ophthalmic ointment Place 1 application into both eyes at bedtime.    Marland Kitchen telmisartan-hydrochlorothiazide (MICARDIS HCT) 80-12.5 MG tablet Take 1 tablet by mouth daily.    . verapamil (VERELAN PM) 120 MG 24 hr capsule Take 120 mg by mouth daily.    Marland Kitchen glycerin adult 2 g suppository Place 1 suppository rectally as needed for constipation. (Patient not taking: Reported on 05/29/2020)     No facility-administered medications prior to visit.     Allergies  Allergen Reactions  . Latex Itching, Rash and Other (See Comments)    NO Band-Aids!!  . Eggs Or Egg-Derived Products Nausea And Vomiting  . Januvia [Sitagliptin] Palpitations and Rash  . Metformin And Related Itching, Palpitations and Rash    Social History   Tobacco Use  . Smoking status: Never Smoker  . Smokeless tobacco: Never Used  Vaping Use  . Vaping  Use: Never used  Substance Use Topics  . Alcohol use: No  . Drug use: No    No family history on file.  Objective:  There were no vitals filed for this visit. There is no height or weight on file to calculate BMI.  Physical Exam Constitutional:      Appearance: Normal appearance. She is not ill-appearing.  HENT:     Mouth/Throat:     Mouth: Mucous membranes are moist.     Pharynx: Oropharynx is clear.  Eyes:     General: No scleral icterus. Pulmonary:     Effort: Pulmonary effort is normal.  Skin:    Comments: PICC line appears clean and dry. I do not see any rash present. Biopatch disc is in place.   Neurological:     Mental Status: She is oriented to person, place, and time.  Psychiatric:        Mood and Affect: Mood normal.        Thought Content: Thought content normal.     Lab Results: Lab Results  Component Value Date   WBC 16.0 (H) 05/05/2020   HGB 9.0 (L) 05/05/2020   HCT 29.7 (L) 05/05/2020   MCV 80.7 05/05/2020   PLT 491 (H) 05/05/2020    Lab Results  Component Value Date   CREATININE 0.80 05/05/2020   BUN 19 05/05/2020   NA 136 05/05/2020   K 4.7 05/05/2020   CL 101 05/05/2020   CO2 23 05/05/2020    Lab Results  Component Value Date   ALT 23 05/27/2013   AST 24 05/27/2013   ALKPHOS 66 05/27/2013   BILITOT 0.3 05/27/2013     Assessment & Plan:   Problem List Items Addressed This Visit      High   Lumbar discitis    She is slowly improving on treatment for MSSA discitis/osteomyelitis with surrounding paraspinal myositis on cefazolin and rifampin therapy. Will plan to continue her IV and rifampin for another 6 weeks through 7/31 and plan transition to long-term suppressive therapy with cephalexin due to retained involved hardware. We briefly discussed that she likely will require at least 12 months of  PO antibiotics, possibly indefinite to avoid relapse of infection. She is not sure she wants to consider taking antibiotics the rest of her life  but will approach one step at a time.  Encouraged her to continue PT and brace for support as needed.  She will follow up in the office with Dr. Megan Salon prior to her end date.         Unprioritized   PICC (peripherally inserted central catheter) in place    She is having some findings c/w mild contact dermatitis possibly due to CHG rinse or sorbaview dressing. I asked her to please discuss with [facility]'s team to see if there is a sensitive kit option for her to use.  If rash develops would d/c CHG rinse / biopatch use.  Keep in place until next appt.       Long term (current) use of antibiotics    No labs from [facility] - will try to track them down.  Should be weekly CMP, CBC, ESR and CRP.           Janene Madeira, MSN, NP-C [facility] for Infectious [place] Pager: [phone] Office: [phone]  05/30/20  4:10 PM

## 2020-05-30 NOTE — Assessment & Plan Note (Signed)
She is slowly improving on treatment for MSSA discitis/osteomyelitis with surrounding paraspinal myositis on cefazolin and rifampin therapy. Will plan to continue her IV and rifampin for another 6 weeks through 7/31 and plan transition to long-term suppressive therapy with cephalexin due to retained involved hardware. We briefly discussed that she likely will require at least 12 months of PO antibiotics, possibly indefinite to avoid relapse of infection. She is not sure she wants to consider taking antibiotics the rest of her life but will approach one step at a time.  Encouraged her to continue PT and brace for support as needed.  She will follow up in the office with Dr. Orvan Falconer prior to her end date.

## 2020-06-01 ENCOUNTER — Telehealth: Payer: Self-pay

## 2020-06-01 NOTE — Telephone Encounter (Signed)
-----  Message from Martin Callas, NP sent at 05/30/2020  3:37 PM EDT ----- Hi team - can someone please contact [facility] to ask them to fax all collected and future labs to [place] for this patient? Should be getting weekly CRP, CMP, ESR, CRPThank you kindly, [facility]

## 2020-06-01 NOTE — Telephone Encounter (Signed)
Opened in error

## 2020-06-01 NOTE — Telephone Encounter (Signed)
I meant to say CBC, CRP, ESR and CMP Sorry for the typo  

## 2020-06-09 ENCOUNTER — Telehealth: Payer: Self-pay | Admitting: *Deleted

## 2020-06-09 DIAGNOSIS — M4646 Discitis, unspecified, lumbar region: Secondary | ICD-10-CM

## 2020-06-09 MED ORDER — RIFAMPIN 300 MG PO CAPS: 300.0000 mg | ORAL_CAPSULE | Freq: Two times a day (BID) | ORAL | 0 refills | Status: DC

## 2020-06-09 NOTE — Telephone Encounter (Signed)
RN sent in refill of rifampin 300 mg capsule, take 1 capsule twice daily #30. This will get her through to her appointment with Dr Orvan Falconer. Andree Coss, RN     I need a Rifampin refill until the end of the month.  Can you call in? Shirlee Limerick Dr Campbell's patient. [phone]. 11/26/ 1957. Call in to [facility] [address] [place] [address] Thank you!   Mychart, Generic  Tameca, Jerez 11 days ago  GM Appointment Information:     Visit Type: Office Visit         Date: 06/17/2020                 Dept: [facility]                 Provider: Cliffton Asters                 Time: 11:30 AM                 Length: 15 min   Appt Status: Scheduled    Appt Instructions:    Please arrive 15 minutes prior to your appointment. This will allow [place] to  verify and update your medical record and ensure a full appointment for you  within the time allotted.

## 2020-06-17 ENCOUNTER — Other Ambulatory Visit: Payer: Self-pay

## 2020-06-17 ENCOUNTER — Ambulatory Visit (INDEPENDENT_AMBULATORY_CARE_PROVIDER_SITE_OTHER): Payer: BC Managed Care – PPO | Admitting: Internal Medicine

## 2020-06-17 ENCOUNTER — Encounter: Payer: Self-pay | Admitting: Internal Medicine

## 2020-06-17 ENCOUNTER — Telehealth: Payer: Self-pay

## 2020-06-17 DIAGNOSIS — M4646 Discitis, unspecified, lumbar region: Secondary | ICD-10-CM

## 2020-06-17 MED ORDER — CEPHALEXIN 500 MG PO CAPS: 500.0000 mg | ORAL_CAPSULE | Freq: Three times a day (TID) | ORAL | 11 refills | Status: DC

## 2020-06-17 NOTE — Assessment & Plan Note (Signed)
She is improving but her inflammatory markers remain elevated.  I talked to her again about the fact that the only way we ever know this type of infection is cured is to eventually stop all antibiotics and wait to see what happens.  She is in favor of converting over to oral cephalexin now.  Her PICC line was removed today.  She will follow-up in 6 weeks.

## 2020-06-17 NOTE — Progress Notes (Signed)
[facility] for Infectious Disease  Patient Active Problem List   Diagnosis Date Noted  . Lumbar discitis 05/05/2020    Priority: High  . S/P lumbar fusion 05/06/2020    Priority: Medium  . Spondylolisthesis of lumbar region 02/12/2020    Priority: Medium  . PICC (peripherally inserted central catheter) in place 05/30/2020  . Long term (current) use of antibiotics 05/30/2020  . Type 2 diabetes mellitus without complications ([place]) 48/18/5631  . Hyperlipidemia 05/06/2020  . Hypertension 05/06/2020  . Morbid obesity ([place]) 05/06/2020    Patient's Medications  New Prescriptions   CEPHALEXIN (KEFLEX) 500 MG CAPSULE    Take 1 capsule (500 mg total) by mouth 3 (three) times daily.  Previous Medications   ACETAMINOPHEN (TYLENOL) 650 MG CR TABLET    Take 1,300 mg by mouth in the morning and at bedtime.   ASPIRIN 81 MG TABLET    Take 81 mg by mouth at bedtime.    B COMPLEX VITAMINS CAPSULE    Take 1 capsule by mouth daily.   CELECOXIB (CELEBREX) 200 MG CAPSULE    Take 200 mg by mouth 2 (two) times daily.    CHOLECALCIFEROL (VITAMIN D-3) 25 MCG (1000 UT) CAPS    Take 1,000 Units by mouth in the morning and at bedtime.   CYCLOBENZAPRINE (FLEXERIL) 10 MG TABLET    Take 1 tablet (10 mg total) by mouth 3 (three) times daily as needed for muscle spasms.   DICYCLOMINE (BENTYL) 10 MG CAPSULE    Take 10 mg by mouth 2 (two) times daily.   DOCUSATE SODIUM (COLACE) 100 MG CAPSULE    Take 1 capsule (100 mg total) by mouth 2 (two) times daily.   GABAPENTIN (NEURONTIN) 300 MG CAPSULE    Take 600 mg by mouth 2 (two) times daily.    GLYCERIN ADULT 2 G SUPPOSITORY    Place 1 suppository rectally as needed for constipation.    HYOSCYAMINE (LEVSIN SL) 0.125 MG SL TABLET    Place 0.125 mg under the tongue daily as needed for cramping.    INSULIN ASPART (NOVOLOG) 100 UNIT/ML INJECTION    Inject 76 Units into the skin See admin instructions. Via VGO insulin pump, up to 76 units/day   INSULIN  DISPOSABLE PUMP (V-GO 20) KIT    continuous.   INSULIN GLARGINE (BASAGLAR KWIKPEN) 100 UNIT/ML    Inject 50 Units into the skin daily after breakfast.   INSULIN PEN NEEDLE (PEN NEEDLES 31GX5/16") 31G X 8 MM MISC    by Does not apply route.   INSULIN SYRINGE-NEEDLE U-100 (ACCUSURE INS SYR 1CC/31GX5/16") 31G X 5/16" 1 ML MISC    by Does not apply route.   MAGNESIUM CITRATE SOLN    Take 10 mLs by mouth once as needed for mild constipation.    MAGNESIUM GLUCONATE 250 MG TABS    Take 250 mg by mouth at bedtime.    MULTIPLE VITAMIN (MULTIVITAMIN) CAPSULE    Take 1 capsule by mouth daily.   NYSTATIN CREAM (MYCOSTATIN)    Apply 1 application topically See admin instructions. Apply to perineal area once a day   OMEPRAZOLE (PRILOSEC) 20 MG CAPSULE    Take 20 mg by mouth 2 (two) times daily.   OXYCODONE-ACETAMINOPHEN (PERCOCET/ROXICET) 5-325 MG TABLET    Take 1-2 tablets by mouth every 4 (four) hours as needed for moderate pain.   SEMAGLUTIDE, 1 MG/DOSE, (OZEMPIC, 1 MG/DOSE,) 2 MG/1.5ML SOPN    Inject 1 mg  into the skin every Sunday.    SODIUM CHLORIDE (MURO 128) 5 % OPHTHALMIC OINTMENT    Place 1 application into both eyes at bedtime.   TELMISARTAN-HYDROCHLOROTHIAZIDE (MICARDIS HCT) 80-12.5 MG TABLET    Take 1 tablet by mouth daily.   VERAPAMIL (VERELAN PM) 120 MG 24 HR CAPSULE    Take 120 mg by mouth daily.  Modified Medications   No medications on file  Discontinued Medications   CEFAZOLIN (ANCEF) 2-4 GM/100ML-% IVPB    Inject 100 mLs (2 g total) into the vein every 8 (eight) hours.   CEFAZOLIN (ANCEF) IVPB    Inject 2 g into the vein every 8 (eight) hours. Indication:  osteomyelitis First Dose: Yes Last Day of Therapy:  06/20/2020 Labs - Once weekly:  CBC/D and BMP, Labs - Every other week:  ESR and CRP Method of administration: IV Push Method of administration may be changed at the discretion of home infusion pharmacist based upon assessment of the patient and/or caregiver's ability to  self-administer the medication ordered.   RIFAMPIN (RIFADIN) 300 MG CAPSULE    Take 1 capsule (300 mg total) by mouth 2 (two) times daily with a meal.    Subjective: Alexis Choi is in for her hospital follow-up visit.  She is accompanied by her husband.  She underwent decompressive laminectomy and fusion at the L4-5 level on 02/12/2020.  Postoperatively she had some fever and continued wound drainage and was put on empiric cephalexin for about 6 weeks.  Her wound eventually closed but she started having progressively more severe back pain.  MRI revealed evidence of lumbar discitis, osteomyelitis and epidural fluid  collection.  IR drained about 15 cc of cloudy fluid.  Gram-positive cocci were seen on stain and cultures grew MSSA.  She was discharged on IV cefazolin and oral rifampin.  She has had a little bit of upset stomach but otherwise has tolerated her antibiotics and PICC well.  She is now just shy of 6 weeks of total IV antibiotic therapy.  She is feeling much better.  Her pain is much improved and she is now only requiring 2 oxycodone at bedtime.  She is taking Tylenol arthritis and gabapentin during the day.  She is making progress with physical therapy.  Review of Systems: Review of Systems  Constitutional: Negative for chills, diaphoresis and fever.  Gastrointestinal: Negative for abdominal pain, diarrhea, nausea and vomiting.  Musculoskeletal: Positive for back pain.    Past Medical History:  Diagnosis Date  . Chronic back pain   . Diabetes mellitus   . Dysrhythmia    pat  . Edema    lower extremity  . Encounter for long-term (current) use of other medications   . Family history of adverse reaction to anesthesia    mother slow to wake  . Fluid retention   . GERD (gastroesophageal reflux disease)   . Hyperlipidemia    no meds  . Hypertension   . IBS (irritable bowel syndrome)   . Osteoarthritis of knee   . Pneumonia    several  . PONV (postoperative nausea and vomiting)     occ  . Unspecified vitamin D deficiency     Social History   Tobacco Use  . Smoking status: Never Smoker  . Smokeless tobacco: Never Used  Vaping Use  . Vaping Use: Never used  Substance Use Topics  . Alcohol use: No  . Drug use: No    No family history on file.  Allergies  Allergen Reactions  .  Latex Itching, Rash and Other (See Comments)    NO Band-Aids!!  . Eggs Or Egg-Derived Products Nausea And Vomiting  . Januvia [Sitagliptin] Palpitations and Rash  . Metformin And Related Itching, Palpitations and Rash    Objective: Vitals:   06/17/20 1120  BP: (!) 165/81  Pulse: 103  Temp: 98 F (36.7 C)  TempSrc: Oral  Weight: (!) 243 lb (110.2 kg)   Body mass index is 45.91 kg/m.  Physical Exam Constitutional:      Comments: She is in good spirits.  Musculoskeletal:     Comments: Her lumbar incision has healed nicely.  Skin:    Findings: No rash.     Comments: Her right arm PICC site looks good.  Neurological:     General: No focal deficit present.  Psychiatric:        Mood and Affect: Mood normal.     Lab Results 06/01/2020 Sedimentation rate elevated at 86 C-reactive protein elevated at 34   Problem List Items Addressed This Visit      High   Lumbar discitis    She is improving but her inflammatory markers remain elevated.  I talked to her again about the fact that the only way we ever know this type of infection is cured is to eventually stop all antibiotics and wait to see what happens.  She is in favor of converting over to oral cephalexin now.  Her PICC line was removed today.  She will follow-up in 6 weeks.        Relevant Medications   cephALEXin (KEFLEX) 500 MG capsule   Other Relevant Orders   C-reactive protein   Sedimentation rate       Michel Bickers, MD [facility] for Infectious [place] [phone] pager   [phone] cell 06/17/2020, 11:56 AM

## 2020-06-17 NOTE — Progress Notes (Signed)
Per verbal order from Dr Orvan Falconer, 39cm Single Lumen Peripherally Inserted Central Catheter removed from right cephalic, tip intact. No sutures present. RN confirmed length per chart. Dressing was clean and dry. Petroleum dressing applied. Pt advised no heavy lifting with this arm, leave dressing for 24 hours and call the office or seek emergent care if dressing becomes soaked with blood or swelling or sharp pain presents. Patient verbalized understanding and agreement.  Patient's questions answered to their satisfaction. Patient tolerated procedure well, RN walked patient to check out. RN notified [facility]. Andree Coss, RN

## 2020-06-17 NOTE — Telephone Encounter (Signed)
Spoke with Corrie Dandy at [facility] and informed her that patient will be stopping the cefazolin and picc line will be pulled today in office by Micael Hampshire, RN  per Dr. Orvan Falconer.  Mary verbalized understanding.  Catrinia Racicot T Pricilla Loveless

## 2020-06-18 LAB — C-REACTIVE PROTEIN: CRP: 32.9 mg/L — ABNORMAL HIGH (ref <8.0)

## 2020-06-18 LAB — SEDIMENTATION RATE: Sed Rate: 53 mm/h — ABNORMAL HIGH (ref 0–30)

## 2020-06-24 ENCOUNTER — Telehealth: Payer: Self-pay

## 2020-06-24 NOTE — Telephone Encounter (Signed)
Patient called office today requesting advise from MD. States she is anemic was asked to take a iron supplement per PCP. PCP request she see a hematologist due to extended blood work, patient does not feel like this is necessary. Would like Dr. Blair Dolphin input on referral. Labs are to be faxed to our office for review. Alexis Choi, [place]

## 2020-06-29 NOTE — Telephone Encounter (Signed)
Please let her know that I have not received copies of her recent blood work.

## 2020-07-28 ENCOUNTER — Ambulatory Visit (INDEPENDENT_AMBULATORY_CARE_PROVIDER_SITE_OTHER): Payer: BC Managed Care – PPO | Admitting: Internal Medicine

## 2020-07-28 ENCOUNTER — Other Ambulatory Visit: Payer: Self-pay

## 2020-07-28 DIAGNOSIS — M4646 Discitis, unspecified, lumbar region: Secondary | ICD-10-CM | POA: Diagnosis not present

## 2020-07-28 NOTE — Progress Notes (Signed)
[facility]  Patient Active Problem List   Diagnosis Date Noted  . Lumbar discitis 05/05/2020    Priority: High  . S/P lumbar fusion 05/06/2020    Priority: Medium  . Spondylolisthesis of lumbar region 02/12/2020    Priority: Medium  . PICC (peripherally inserted central catheter) in place 05/30/2020  . Long term (current) use of antibiotics 05/30/2020  . Type 2 diabetes mellitus without complications ([place]) 11/65/7903  . Hyperlipidemia 05/06/2020  . Hypertension 05/06/2020  . Morbid obesity ([place]) 05/06/2020    Patient's Medications  New Prescriptions   No medications on file  Previous Medications   ACETAMINOPHEN (TYLENOL) 650 MG CR TABLET    Take 1,300 mg by mouth in the morning and at bedtime.   ASPIRIN 81 MG TABLET    Take 81 mg by mouth at bedtime.    B COMPLEX VITAMINS CAPSULE    Take 1 capsule by mouth daily.   CELECOXIB (CELEBREX) 200 MG CAPSULE    Take 200 mg by mouth 2 (two) times daily.    CEPHALEXIN (KEFLEX) 500 MG CAPSULE    Take 1 capsule (500 mg total) by mouth 3 (three) times daily.   CHOLECALCIFEROL (VITAMIN D-3) 25 MCG (1000 UT) CAPS    Take 1,000 Units by mouth in the morning and at bedtime.   CYCLOBENZAPRINE (FLEXERIL) 10 MG TABLET    Take 1 tablet (10 mg total) by mouth 3 (three) times daily as needed for muscle spasms.   DICYCLOMINE (BENTYL) 10 MG CAPSULE    Take 10 mg by mouth 2 (two) times daily.   DOCUSATE SODIUM (COLACE) 100 MG CAPSULE    Take 1 capsule (100 mg total) by mouth 2 (two) times daily.   FERROUS SULFATE 325 (65 FE) MG TABLET    Take 325 mg by mouth daily with breakfast.   GABAPENTIN (NEURONTIN) 300 MG CAPSULE    Take 600 mg by mouth 2 (two) times daily.    GLYCERIN ADULT 2 G SUPPOSITORY    Place 1 suppository rectally as needed for constipation.    HYOSCYAMINE (LEVSIN SL) 0.125 MG SL TABLET    Place 0.125 mg under the tongue daily as needed for cramping.    INSULIN ASPART (NOVOLOG) 100 UNIT/ML INJECTION     Inject 76 Units into the skin See admin instructions. Via VGO insulin pump, up to 76 units/day   INSULIN DISPOSABLE PUMP (V-GO 20) KIT    continuous.   INSULIN DISPOSABLE PUMP (V-GO 40) KIT    by Does not apply route.   INSULIN GLARGINE (BASAGLAR KWIKPEN) 100 UNIT/ML    Inject 50 Units into the skin daily after breakfast.   INSULIN PEN NEEDLE (PEN NEEDLES 31GX5/16") 31G X 8 MM MISC    by Does not apply route.   INSULIN SYRINGE-NEEDLE U-100 (ACCUSURE INS SYR 1CC/31GX5/16") 31G X 5/16" 1 ML MISC    by Does not apply route.   MAGNESIUM CITRATE SOLN    Take 10 mLs by mouth once as needed for mild constipation.    MAGNESIUM GLUCONATE 250 MG TABS    Take 250 mg by mouth at bedtime.    MULTIPLE VITAMIN (MULTIVITAMIN) CAPSULE    Take 1 capsule by mouth daily.   NYSTATIN CREAM (MYCOSTATIN)    Apply 1 application topically See admin instructions. Apply to perineal area once a day   OMEPRAZOLE (PRILOSEC) 20 MG CAPSULE    Take 20 mg by mouth 2 (two) times  daily.   OXYCODONE-ACETAMINOPHEN (PERCOCET/ROXICET) 5-325 MG TABLET    Take 1-2 tablets by mouth every 4 (four) hours as needed for moderate pain.   SEMAGLUTIDE, 1 MG/DOSE, (OZEMPIC, 1 MG/DOSE,) 2 MG/1.5ML SOPN    Inject 1 mg into the skin every Sunday.    SODIUM CHLORIDE (MURO 128) 5 % OPHTHALMIC OINTMENT    Place 1 application into both eyes at bedtime.   TELMISARTAN-HYDROCHLOROTHIAZIDE (MICARDIS HCT) 80-12.5 MG TABLET    Take 1 tablet by mouth daily.   VERAPAMIL (VERELAN PM) 120 MG 24 HR CAPSULE    Take 120 mg by mouth daily.  Modified Medications   No medications on file  Discontinued Medications   No medications on file    Subjective: [name] is in for her routine follow-up visit.  She is accompanied by her husband.  She underwent decompressive laminectomy and fusion at the L4-5 level on 02/12/2020.  Postoperatively she had some fever and continued wound drainage and was put on empiric cephalexin for about 6 weeks.  Her wound eventually closed  but she started having progressively more severe back pain.  MRI revealed evidence of lumbar discitis, osteomyelitis and epidural fluid  collection.  IR drained about 15 cc of cloudy fluid.  Gram-positive cocci were seen on stain and cultures grew MSSA.  She was discharged on IV cefazolin and oral rifampin.  She completed 6 weeks of IV antibiotic therapy on 06/17/2020 before converting to oral cephalexin.  She is feeling much better.  Her pain is much improved and she is no longer requiring any narcotic pain medication.  She is taking Tylenol arthritis and gabapentin during the day.  She is able to be more active.  Review of Systems: Review of Systems  Constitutional: Negative for chills, diaphoresis and fever.  Gastrointestinal: Positive for diarrhea and nausea. Negative for abdominal pain and vomiting.       She has intermittent nausea and mild diarrhea which she attributes to chronic IBS.  She says that the symptoms are unchanged.  Musculoskeletal: Positive for back pain.  Psychiatric/Behavioral: The patient has insomnia.     Past Medical History:  Diagnosis Date  . Chronic back pain   . Diabetes mellitus   . Dysrhythmia    pat  . Edema    lower extremity  . Encounter for long-term (current) use of other medications   . Family history of adverse reaction to anesthesia    mother slow to wake  . Fluid retention   . GERD (gastroesophageal reflux disease)   . Hyperlipidemia    no meds  . Hypertension   . IBS (irritable bowel syndrome)   . Osteoarthritis of knee   . Pneumonia    several  . PONV (postoperative nausea and vomiting)    occ  . Unspecified vitamin D deficiency     Social History   Tobacco Use  . Smoking status: Never Smoker  . Smokeless tobacco: Never Used  Vaping Use  . Vaping Use: Never used  Substance Use Topics  . Alcohol use: No  . Drug use: No    No family history on file.  Allergies  Allergen Reactions  . Latex Itching, Rash and Other (See  Comments)    NO Band-Aids!!  . Eggs Or Egg-Derived Products Nausea And Vomiting  . Januvia [Sitagliptin] Palpitations and Rash  . Metformin And Related Itching, Palpitations and Rash    Objective: Vitals:   07/28/20 1336  BP: (!) 162/80  Pulse: 78  Temp: 97.7 F (  36.5 C)  TempSrc: Oral  Weight: 255 lb (115.7 kg)   Body mass index is 48.18 kg/m.  Physical Exam Constitutional:      Comments: She is in good spirits.  Cardiovascular:     Rate and Rhythm: Normal rate and regular rhythm.     Heart sounds: No murmur heard.   Pulmonary:     Effort: Pulmonary effort is normal.     Breath sounds: Normal breath sounds.  Abdominal:     Palpations: Abdomen is soft.     Tenderness: There is no abdominal tenderness.  Musculoskeletal:     Comments: Her lumbar incision has healed nicely.  Skin:    Findings: No rash.  Neurological:     General: No focal deficit present.  Psychiatric:        Mood and Affect: Mood normal.     Lab Results 06/01/2020 Sedimentation rate elevated at 86 C-reactive protein elevated at 34   Problem List Items Addressed This Visit      High   Lumbar discitis    She is improving slowly on therapy for her postoperative MSSA lumbar infection.  I will get repeat inflammatory markers today.  She will continue cephalexin and follow-up in 2 months.      Relevant Orders   C-reactive protein   Sedimentation rate   CBC   Iron       Michel Bickers, MD [facility] [employer] [phone] pager   [phone] cell 07/28/2020, 1:57 PM

## 2020-07-28 NOTE — Assessment & Plan Note (Signed)
She is improving slowly on therapy for her postoperative MSSA lumbar infection.  I will get repeat inflammatory markers today.  She will continue cephalexin and follow-up in 2 months.

## 2020-07-29 LAB — CBC
HCT: 34.6 % — ABNORMAL LOW (ref 35.0–45.0)
Hemoglobin: 11.2 g/dL — ABNORMAL LOW (ref 11.7–15.5)
MCH: 26.7 pg — ABNORMAL LOW (ref 27.0–33.0)
MCHC: 32.4 g/dL (ref 32.0–36.0)
MCV: 82.4 fL (ref 80.0–100.0)
MPV: 11 fL (ref 7.5–12.5)
Platelets: 293 10*3/uL (ref 140–400)
RBC: 4.2 10*6/uL (ref 3.80–5.10)
RDW: 16.8 % — ABNORMAL HIGH (ref 11.0–15.0)
WBC: 12.9 10*3/uL — ABNORMAL HIGH (ref 3.8–10.8)

## 2020-07-29 LAB — IRON: Iron: 111 ug/dL (ref 45–160)

## 2020-07-29 LAB — SEDIMENTATION RATE: Sed Rate: 36 mm/h — ABNORMAL HIGH (ref 0–30)

## 2020-07-29 LAB — C-REACTIVE PROTEIN: CRP: 22.9 mg/L — ABNORMAL HIGH (ref <8.0)

## 2020-09-29 ENCOUNTER — Ambulatory Visit (INDEPENDENT_AMBULATORY_CARE_PROVIDER_SITE_OTHER): Payer: BC Managed Care – PPO | Admitting: Internal Medicine

## 2020-09-29 ENCOUNTER — Other Ambulatory Visit: Payer: Self-pay

## 2020-09-29 ENCOUNTER — Encounter: Payer: Self-pay | Admitting: Internal Medicine

## 2020-09-29 DIAGNOSIS — M4646 Discitis, unspecified, lumbar region: Secondary | ICD-10-CM | POA: Diagnosis not present

## 2020-09-29 NOTE — Assessment & Plan Note (Addendum)
She is concerned that her recent stomach upset might be related to cephalexin. She is concerned about possibly stopping cephalexin and having her infection flare back up. Her inflammatory markers are improving but were still elevated 2 months ago. We will repeat them today before discussing antibiotic plans with her.  Addendum: Sed Rate (mm/h)  Date Value  09/29/2020 36 (H)  07/28/2020 36 (H)  06/17/2020 53 (H)   CRP (mg/L)  Date Value  09/29/2020 30.5 (H)  07/28/2020 22.9 (H)  06/17/2020 32.9 (H)   Her inflammatory markers remain elevated.  I recommend having her continue cephalexin for now.

## 2020-09-29 NOTE — Progress Notes (Signed)
[facility]  Patient Active Problem List   Diagnosis Date Noted  . Lumbar discitis 05/05/2020    Priority: High  . S/P lumbar fusion 05/06/2020    Priority: Medium  . Spondylolisthesis of lumbar region 02/12/2020    Priority: Medium  . PICC (peripherally inserted central catheter) in place 05/30/2020  . Long term (current) use of antibiotics 05/30/2020  . Type 2 diabetes mellitus without complications ([place]) 11/65/7903  . Hyperlipidemia 05/06/2020  . Hypertension 05/06/2020  . Morbid obesity ([place]) 05/06/2020    Patient's Medications  New Prescriptions   No medications on file  Previous Medications   ACETAMINOPHEN (TYLENOL) 650 MG CR TABLET    Take 1,300 mg by mouth in the morning and at bedtime.   ASPIRIN 81 MG TABLET    Take 81 mg by mouth at bedtime.    B COMPLEX VITAMINS CAPSULE    Take 1 capsule by mouth daily.   CELECOXIB (CELEBREX) 200 MG CAPSULE    Take 200 mg by mouth 2 (two) times daily.    CEPHALEXIN (KEFLEX) 500 MG CAPSULE    Take 1 capsule (500 mg total) by mouth 3 (three) times daily.   CHOLECALCIFEROL (VITAMIN D-3) 25 MCG (1000 UT) CAPS    Take 1,000 Units by mouth in the morning and at bedtime.   CYCLOBENZAPRINE (FLEXERIL) 10 MG TABLET    Take 1 tablet (10 mg total) by mouth 3 (three) times daily as needed for muscle spasms.   DICYCLOMINE (BENTYL) 10 MG CAPSULE    Take 10 mg by mouth 2 (two) times daily.   DOCUSATE SODIUM (COLACE) 100 MG CAPSULE    Take 1 capsule (100 mg total) by mouth 2 (two) times daily.   FERROUS SULFATE 325 (65 FE) MG TABLET    Take 325 mg by mouth daily with breakfast.   GABAPENTIN (NEURONTIN) 300 MG CAPSULE    Take 600 mg by mouth 2 (two) times daily.    GLYCERIN ADULT 2 G SUPPOSITORY    Place 1 suppository rectally as needed for constipation.    HYOSCYAMINE (LEVSIN SL) 0.125 MG SL TABLET    Place 0.125 mg under the tongue daily as needed for cramping.    INSULIN ASPART (NOVOLOG) 100 UNIT/ML INJECTION     Inject 76 Units into the skin See admin instructions. Via VGO insulin pump, up to 76 units/day   INSULIN DISPOSABLE PUMP (V-GO 20) KIT    continuous.   INSULIN DISPOSABLE PUMP (V-GO 40) KIT    by Does not apply route.   INSULIN GLARGINE (BASAGLAR KWIKPEN) 100 UNIT/ML    Inject 50 Units into the skin daily after breakfast.   INSULIN PEN NEEDLE (PEN NEEDLES 31GX5/16") 31G X 8 MM MISC    by Does not apply route.   INSULIN SYRINGE-NEEDLE U-100 (ACCUSURE INS SYR 1CC/31GX5/16") 31G X 5/16" 1 ML MISC    by Does not apply route.   MAGNESIUM CITRATE SOLN    Take 10 mLs by mouth once as needed for mild constipation.    MAGNESIUM GLUCONATE 250 MG TABS    Take 250 mg by mouth at bedtime.    MULTIPLE VITAMIN (MULTIVITAMIN) CAPSULE    Take 1 capsule by mouth daily.   NYSTATIN CREAM (MYCOSTATIN)    Apply 1 application topically See admin instructions. Apply to perineal area once a day   OMEPRAZOLE (PRILOSEC) 20 MG CAPSULE    Take 20 mg by mouth 2 (two) times  daily.   OXYCODONE-ACETAMINOPHEN (PERCOCET/ROXICET) 5-325 MG TABLET    Take 1-2 tablets by mouth every 4 (four) hours as needed for moderate pain.   SEMAGLUTIDE, 1 MG/DOSE, (OZEMPIC, 1 MG/DOSE,) 2 MG/1.5ML SOPN    Inject 1 mg into the skin every Sunday.    SODIUM CHLORIDE (MURO 128) 5 % OPHTHALMIC OINTMENT    Place 1 application into both eyes at bedtime.   TELMISARTAN-HYDROCHLOROTHIAZIDE (MICARDIS HCT) 80-12.5 MG TABLET    Take 1 tablet by mouth daily.   VERAPAMIL (VERELAN PM) 120 MG 24 HR CAPSULE    Take 120 mg by mouth daily.  Modified Medications   No medications on file  Discontinued Medications   No medications on file    Subjective: Alexis Choi is in for her routine follow-up visit.  She is accompanied by her husband.  She underwent decompressive laminectomy and fusion at the L4-5 level on 02/12/2020.  Postoperatively she had some fever and continued wound drainage and was put on empiric cephalexin for about 6 weeks.  Her wound eventually closed  but she started having progressively more severe back pain.  MRI revealed evidence of lumbar discitis, osteomyelitis and epidural fluid  collection.  IR drained about 15 cc of cloudy fluid.  Gram-positive cocci were seen on stain and cultures grew MSSA.  She was discharged on IV cefazolin and oral rifampin.  She completed 6 weeks of IV antibiotic therapy on 06/17/2020 before converting to oral cephalexin.  Her back pain is much better.  Her pain is much improved and she is no longer requiring any narcotic pain medication.  She is taking Tylenol arthritis and gabapentin during the day.  She is able to be more active. She says that over the last few weeks she has had more problems with "upset stomach". She has been bothered by heartburn, intermittent nausea, vomiting and slight worsening of her chronic diarrhea.  Review of Systems: Review of Systems  Constitutional: Negative for chills, diaphoresis and fever.  Gastrointestinal: Positive for diarrhea, heartburn and nausea. Negative for abdominal pain and vomiting.       She has intermittent nausea and mild diarrhea which she attributes to chronic IBS.  She says that the symptoms are unchanged.  Musculoskeletal: Positive for back pain.  Psychiatric/Behavioral: The patient has insomnia.     Past Medical History:  Diagnosis Date  . Chronic back pain   . Diabetes mellitus   . Dysrhythmia    pat  . Edema    lower extremity  . Encounter for long-term (current) use of other medications   . Family history of adverse reaction to anesthesia    mother slow to wake  . Fluid retention   . GERD (gastroesophageal reflux disease)   . Hyperlipidemia    no meds  . Hypertension   . IBS (irritable bowel syndrome)   . Osteoarthritis of knee   . Pneumonia    several  . PONV (postoperative nausea and vomiting)    occ  . Unspecified vitamin D deficiency     Social History   Tobacco Use  . Smoking status: Never Smoker  . Smokeless tobacco: Never Used   Vaping Use  . Vaping Use: Never used  Substance Use Topics  . Alcohol use: No  . Drug use: No    No family history on file.  Allergies  Allergen Reactions  . Latex Itching, Rash and Other (See Comments)    NO Band-Aids!!  . Eggs Or Egg-Derived Products Nausea And Vomiting  .  Januvia [Sitagliptin] Palpitations and Rash  . Metformin And Related Itching, Palpitations and Rash    Objective: Vitals:   09/29/20 1442  BP: 140/84  Pulse: 88  Temp: 98 F (36.7 C)  Weight: 268 lb (121.6 kg)  Height: $Remove'5\' 1"'TFuEleu$  (1.549 m)   Body mass index is 50.64 kg/m.  Physical Exam Constitutional:      Comments: She is in good spirits.  Cardiovascular:     Rate and Rhythm: Normal rate and regular rhythm.     Heart sounds: No murmur heard.   Pulmonary:     Effort: Pulmonary effort is normal.     Breath sounds: Normal breath sounds.  Abdominal:     Palpations: Abdomen is soft.     Tenderness: There is no abdominal tenderness.  Musculoskeletal:     Comments: Her lumbar incision has healed nicely.  Skin:    Findings: No rash.  Neurological:     General: No focal deficit present.  Psychiatric:        Mood and Affect: Mood normal.     Lab Results 06/01/2020 Sedimentation rate elevated at 86 C-reactive protein elevated at 34   Problem List Items Addressed This Visit      High   Lumbar discitis    She is concerned that her recent stomach upset might be related to cephalexin. She is concerned about possibly stopping cephalexin and having her infection flare back up. Her inflammatory markers are proving but were still elevated 2 months ago. We will repeat them today before discussing antibiotic plans with her.      Relevant Orders   CBC   Basic metabolic panel   C-reactive protein   Sedimentation rate       Alexis Bickers, MD [facility] for [facility] [place] [phone] pager   [phone] cell 09/29/2020, 2:59 PM

## 2020-09-30 LAB — CBC
HCT: 36.5 % (ref 35.0–45.0)
Hemoglobin: 12.5 g/dL (ref 11.7–15.5)
MCH: 28.3 pg (ref 27.0–33.0)
MCHC: 34.2 g/dL (ref 32.0–36.0)
MCV: 82.6 fL (ref 80.0–100.0)
MPV: 11 fL (ref 7.5–12.5)
Platelets: 274 10*3/uL (ref 140–400)
RBC: 4.42 Million/uL (ref 3.80–5.10)
RDW: 15.1 % — ABNORMAL HIGH (ref 11.0–15.0)
WBC: 12.1 10*3/uL — ABNORMAL HIGH (ref 3.8–10.8)

## 2020-09-30 LAB — BASIC METABOLIC PANEL
BUN/Creatinine Ratio: 22 (calc) (ref 6–22)
BUN: 22 mg/dL (ref 7–25)
CO2: 17 mmol/L — ABNORMAL LOW (ref 20–32)
Calcium: 9.7 mg/dL (ref 8.6–10.4)
Chloride: 107 mmol/L (ref 98–110)
Creat: 1.01 mg/dL — ABNORMAL HIGH (ref 0.50–0.99)
Glucose, Bld: 147 mg/dL — ABNORMAL HIGH (ref 65–99)
Potassium: 4.8 mmol/L (ref 3.5–5.3)
Sodium: 142 mmol/L (ref 135–146)

## 2020-09-30 LAB — SEDIMENTATION RATE: Sed Rate: 36 mm/h — ABNORMAL HIGH (ref 0–30)

## 2020-09-30 LAB — C-REACTIVE PROTEIN: CRP: 30.5 mg/L — ABNORMAL HIGH (ref <8.0)

## 2020-12-29 ENCOUNTER — Encounter: Payer: Self-pay | Admitting: Internal Medicine

## 2020-12-29 ENCOUNTER — Other Ambulatory Visit: Payer: Self-pay

## 2020-12-29 ENCOUNTER — Ambulatory Visit (INDEPENDENT_AMBULATORY_CARE_PROVIDER_SITE_OTHER): Payer: BC Managed Care – PPO | Admitting: Internal Medicine

## 2020-12-29 DIAGNOSIS — M4646 Discitis, unspecified, lumbar region: Secondary | ICD-10-CM

## 2020-12-29 NOTE — Assessment & Plan Note (Signed)
Alexis Choi has had much improvement on cephalexin therapy for MSSA postoperative lumbar infection.  Her back pain has nearly completely resolved but her inflammatory markers remain elevated.  She is aware that these are very nonspecific.  She is leaning towards stopping her cephalexin soon but would like to wait until she meets with her surgeon, Dr. Delma Officer in late March.  She will get repeat lab work today and follow-up with me after she sees Dr. Lovell Sheehan.

## 2020-12-29 NOTE — Progress Notes (Signed)
[facility]  Patient Active Problem List   Diagnosis Date Noted  . Lumbar discitis 05/05/2020    Priority: High  . S/P lumbar fusion 05/06/2020    Priority: Medium  . Spondylolisthesis of lumbar region 02/12/2020    Priority: Medium  . PICC (peripherally inserted central catheter) in place 05/30/2020  . Long term (current) use of antibiotics 05/30/2020  . Type 2 diabetes mellitus without complications ([place]) 10/93/2355  . Hyperlipidemia 05/06/2020  . Hypertension 05/06/2020  . Morbid obesity ([place]) 05/06/2020    Patient's Medications  New Prescriptions   No medications on file  Previous Medications   ACETAMINOPHEN (TYLENOL) 650 MG CR TABLET    Take 1,300 mg by mouth in the morning and at bedtime.   ASPIRIN 81 MG TABLET    Take 81 mg by mouth at bedtime.    B COMPLEX VITAMINS CAPSULE    Take 1 capsule by mouth daily.   CELECOXIB (CELEBREX) 200 MG CAPSULE    Take 200 mg by mouth 2 (two) times daily.    CEPHALEXIN (KEFLEX) 500 MG CAPSULE    Take 1 capsule (500 mg total) by mouth 3 (three) times daily.   CHOLECALCIFEROL (VITAMIN D-3) 25 MCG (1000 UT) CAPS    Take 1,000 Units by mouth in the morning and at bedtime.   CYCLOBENZAPRINE (FLEXERIL) 10 MG TABLET    Take 1 tablet (10 mg total) by mouth 3 (three) times daily as needed for muscle spasms.   DICYCLOMINE (BENTYL) 10 MG CAPSULE    Take 10 mg by mouth 2 (two) times daily.   DOCUSATE SODIUM (COLACE) 100 MG CAPSULE    Take 1 capsule (100 mg total) by mouth 2 (two) times daily.   FERROUS SULFATE 325 (65 FE) MG TABLET    Take 325 mg by mouth daily with breakfast.   FLUCONAZOLE (DIFLUCAN) 150 MG TABLET    Take 150 mg by mouth once.   GABAPENTIN (NEURONTIN) 300 MG CAPSULE    Take 600 mg by mouth 2 (two) times daily.    GABAPENTIN (NEURONTIN) 600 MG TABLET    Take 600 mg by mouth 3 (three) times daily.   GLYCERIN ADULT 2 G SUPPOSITORY    Place 1 suppository rectally as needed for constipation.     HYOSCYAMINE (LEVSIN SL) 0.125 MG SL TABLET    Place 0.125 mg under the tongue daily as needed for cramping.    INSULIN ASPART (NOVOLOG) 100 UNIT/ML INJECTION    Inject 76 Units into the skin See admin instructions. Via VGO insulin pump, up to 76 units/day   INSULIN DISPOSABLE PUMP (V-GO 20) KIT    continuous.   INSULIN DISPOSABLE PUMP (V-GO 40) KIT    by Does not apply route.   INSULIN GLARGINE (BASAGLAR KWIKPEN) 100 UNIT/ML    Inject 50 Units into the skin daily after breakfast.   INSULIN PEN NEEDLE (PEN NEEDLES 31GX5/16") 31G X 8 MM MISC    by Does not apply route.   INSULIN SYRINGE-NEEDLE U-100 (ACCUSURE INS SYR 1CC/31GX5/16") 31G X 5/16" 1 ML MISC    by Does not apply route.   MAGNESIUM CITRATE SOLN    Take 10 mLs by mouth once as needed for mild constipation.    MAGNESIUM GLUCONATE 250 MG TABS    Take 250 mg by mouth at bedtime.    MULTIPLE VITAMIN (MULTIVITAMIN) CAPSULE    Take 1 capsule by mouth daily.   NYSTATIN CREAM (MYCOSTATIN)  Apply 1 application topically See admin instructions. Apply to perineal area once a day   OMEPRAZOLE (PRILOSEC) 20 MG CAPSULE    Take 20 mg by mouth 2 (two) times daily.   OXYCODONE-ACETAMINOPHEN (PERCOCET/ROXICET) 5-325 MG TABLET    Take 1-2 tablets by mouth every 4 (four) hours as needed for moderate pain.   SEMAGLUTIDE, 1 MG/DOSE, 2 MG/1.5ML SOPN    Inject 1 mg into the skin every Sunday.    SODIUM CHLORIDE (MURO 128) 5 % OPHTHALMIC OINTMENT    Place 1 application into both eyes at bedtime.   TELMISARTAN-HYDROCHLOROTHIAZIDE (MICARDIS HCT) 80-12.5 MG TABLET    Take 1 tablet by mouth daily.   VERAPAMIL (VERELAN PM) 120 MG 24 HR CAPSULE    Take 120 mg by mouth daily.  Modified Medications   No medications on file  Discontinued Medications   No medications on file    Subjective: Alexis Choi is in for her routine follow-up visit.  She is accompanied by her husband.  She underwent decompressive laminectomy and fusion at the L4-5 level on 02/12/2020.   Postoperatively she had some fever and continued wound drainage and was put on empiric cephalexin for about 6 weeks.  Her wound eventually closed but she started having progressively more severe back pain.  MRI revealed evidence of lumbar discitis, osteomyelitis and epidural fluid  collection.  IR drained about 15 cc of cloudy fluid.  Gram-positive cocci were seen on stain and cultures grew MSSA.  She was discharged on IV cefazolin and oral rifampin.  She completed 6 weeks of IV antibiotic therapy on 06/17/2020 before converting to oral cephalexin.  Her back pain is much better.  Her pain is much improved and she is no longer requiring any narcotic pain medication.  She is taking Tylenol arthritis and gabapentin during the day.  She is able to be more active. She says that over the last few weeks she has had more problems with "upset stomach". She has been bothered by heartburn, intermittent nausea, vomiting and slight worsening of her chronic diarrhea.  Her sed rate and C-reactive protein remain elevated when she was last here in November.  We decided to have her continue cephalexin.  She has recently stopped taking several of her multivitamins and supplements including iron.  She notes that her upset stomach and chronic diarrhea are a little bit better.  She attributes a lot of this to her irritable bowel syndrome.  She is not sure if any of her GI symptoms are really due to cephalexin.  She is having very little if any back pain.  She wonders if her elevated inflammatory markers could be related to arthritis in her shoulders rather than her back infection.  Review of Systems: Review of Systems  Constitutional: Negative for chills, diaphoresis and fever.  Gastrointestinal: Positive for diarrhea, heartburn and nausea. Negative for abdominal pain and vomiting.       She has intermittent nausea and mild diarrhea which she attributes to chronic IBS.  She says that the symptoms are unchanged.  Musculoskeletal:  Positive for back pain and joint pain.  Psychiatric/Behavioral: The patient has insomnia.     Past Medical History:  Diagnosis Date  . Chronic back pain   . Diabetes mellitus   . Dysrhythmia    pat  . Edema    lower extremity  . Encounter for long-term (current) use of other medications   . Family history of adverse reaction to anesthesia    mother slow to wake  .  Fluid retention   . GERD (gastroesophageal reflux disease)   . Hyperlipidemia    no meds  . Hypertension   . IBS (irritable bowel syndrome)   . Osteoarthritis of knee   . Pneumonia    several  . PONV (postoperative nausea and vomiting)    occ  . Unspecified vitamin D deficiency     Social History   Tobacco Use  . Smoking status: Never Smoker  . Smokeless tobacco: Never Used  Vaping Use  . Vaping Use: Never used  Substance Use Topics  . Alcohol use: No  . Drug use: No    No family history on file.  Allergies  Allergen Reactions  . Latex Itching, Rash and Other (See Comments)    NO Band-Aids!!  . Eggs Or Egg-Derived Products Nausea And Vomiting  . Januvia [Sitagliptin] Palpitations and Rash  . Metformin And Related Itching, Palpitations and Rash    Objective: Vitals:   12/29/20 1442  BP: (!) 150/83  Pulse: 88  Temp: (!) 97.5 F (36.4 C)  TempSrc: Oral  SpO2: 98%  Weight: 269 lb (122 kg)   Body mass index is 50.83 kg/m.  Physical Exam Constitutional:      Comments: She is very pleasant as usual.  She is accompanied by her husband.  Cardiovascular:     Rate and Rhythm: Normal rate.     Heart sounds: No murmur heard.   Pulmonary:     Effort: Pulmonary effort is normal.  Abdominal:     Palpations: Abdomen is soft.     Tenderness: There is no abdominal tenderness.  Skin:    Findings: No rash.  Neurological:     General: No focal deficit present.  Psychiatric:        Mood and Affect: Mood normal.    Sed Rate (mm/h)  Date Value  09/29/2020 36 (H)  07/28/2020 36 (H)   06/17/2020 53 (H)   CRP (mg/L)  Date Value  09/29/2020 30.5 (H)  07/28/2020 22.9 (H)  06/17/2020 32.9 (H)     Problem List Items Addressed This Visit      High   Lumbar discitis    Alexis Choi has had much improvement on cephalexin therapy for MSSA postoperative lumbar infection.  Her back pain has nearly completely resolved but her inflammatory markers remain elevated.  She is aware that these are very nonspecific.  She is leaning towards stopping her cephalexin soon but would like to wait until she meets with her surgeon, Dr. Earle Gell in late March.  She will get repeat lab work today and follow-up with me after she sees Dr. Arnoldo Morale.      Relevant Orders   C-reactive protein   Sedimentation rate       Michel Bickers, MD [facility] for [facility]ious [place] [phone] pager   [phone] cell 12/29/2020, 3:11 PM

## 2020-12-30 LAB — C-REACTIVE PROTEIN: CRP: 28.2 mg/L — ABNORMAL HIGH (ref <8.0)

## 2020-12-30 LAB — SEDIMENTATION RATE: Sed Rate: 31 mm/h — ABNORMAL HIGH (ref 0–30)

## 2021-02-23 ENCOUNTER — Ambulatory Visit (INDEPENDENT_AMBULATORY_CARE_PROVIDER_SITE_OTHER): Payer: BC Managed Care – PPO | Admitting: Internal Medicine

## 2021-02-23 ENCOUNTER — Other Ambulatory Visit: Payer: Self-pay

## 2021-02-23 ENCOUNTER — Encounter: Payer: Self-pay | Admitting: Internal Medicine

## 2021-02-23 DIAGNOSIS — M4646 Discitis, unspecified, lumbar region: Secondary | ICD-10-CM

## 2021-02-23 NOTE — Assessment & Plan Note (Signed)
Although her inflammatory markers remain elevated and she has had good clinical improvement and I think there is a very good chance that her infection has been cured.  She says that she is ready to stop cephalexin now.  She will follow-up in 6 weeks.

## 2021-02-23 NOTE — Addendum Note (Signed)
Addended by: Andree Coss on: 02/23/2021 05:21 PM   Modules accepted: Orders

## 2021-02-23 NOTE — Progress Notes (Signed)
[facility] for Infectious Disease  Patient Active Problem List   Diagnosis Date Noted  . Lumbar discitis 05/05/2020    Priority: High  . S/P lumbar fusion 05/06/2020    Priority: Medium  . Spondylolisthesis of lumbar region 02/12/2020    Priority: Medium  . PICC (peripherally inserted central catheter) in place 05/30/2020  . Long term (current) use of antibiotics 05/30/2020  . Type 2 diabetes mellitus without complications ([place]) 38/33/3832  . Hyperlipidemia 05/06/2020  . Hypertension 05/06/2020  . Morbid obesity ([place]) 05/06/2020    Patient's Medications  New Prescriptions   No medications on file  Previous Medications   ACETAMINOPHEN (TYLENOL) 650 MG CR TABLET    Take 1,300 mg by mouth in the morning and at bedtime.   ASPIRIN 81 MG TABLET    Take 81 mg by mouth at bedtime.    B COMPLEX VITAMINS CAPSULE    Take 1 capsule by mouth daily.   CELECOXIB (CELEBREX) 200 MG CAPSULE    Take 200 mg by mouth 2 (two) times daily.    CHOLECALCIFEROL (VITAMIN D-3) 25 MCG (1000 UT) CAPS    Take 1,000 Units by mouth in the morning and at bedtime.   CYCLOBENZAPRINE (FLEXERIL) 10 MG TABLET    Take 1 tablet (10 mg total) by mouth 3 (three) times daily as needed for muscle spasms.   DICYCLOMINE (BENTYL) 10 MG CAPSULE    Take 10 mg by mouth 2 (two) times daily.   DOCUSATE SODIUM (COLACE) 100 MG CAPSULE    Take 1 capsule (100 mg total) by mouth 2 (two) times daily.   FERROUS SULFATE 325 (65 FE) MG TABLET    Take 325 mg by mouth daily with breakfast.   FLUCONAZOLE (DIFLUCAN) 150 MG TABLET    Take 150 mg by mouth once.   GABAPENTIN (NEURONTIN) 300 MG CAPSULE    Take 600 mg by mouth 2 (two) times daily.    GABAPENTIN (NEURONTIN) 600 MG TABLET    Take 600 mg by mouth 3 (three) times daily.   GLYCERIN ADULT 2 G SUPPOSITORY    Place 1 suppository rectally as needed for constipation.    HYOSCYAMINE (LEVSIN SL) 0.125 MG SL TABLET    Place 0.125 mg under the tongue daily as needed for cramping.     INSULIN ASPART (NOVOLOG) 100 UNIT/ML INJECTION    Inject 76 Units into the skin See admin instructions. Via VGO insulin pump, up to 76 units/day   INSULIN DISPOSABLE PUMP (V-GO 20) KIT    continuous.   INSULIN DISPOSABLE PUMP (V-GO 40) KIT    by Does not apply route.   INSULIN GLARGINE (BASAGLAR KWIKPEN) 100 UNIT/ML    Inject 50 Units into the skin daily after breakfast.   INSULIN PEN NEEDLE (PEN NEEDLES 31GX5/16") 31G X 8 MM MISC    by Does not apply route.   INSULIN SYRINGE-NEEDLE U-100 (ACCUSURE INS SYR 1CC/31GX5/16") 31G X 5/16" 1 ML MISC    by Does not apply route.   MAGNESIUM CITRATE SOLN    Take 10 mLs by mouth once as needed for mild constipation.    MAGNESIUM GLUCONATE 250 MG TABS    Take 250 mg by mouth at bedtime.    MULTIPLE VITAMIN (MULTIVITAMIN) CAPSULE    Take 1 capsule by mouth daily.   NYSTATIN CREAM (MYCOSTATIN)    Apply 1 application topically See admin instructions. Apply to perineal area once a day   OMEPRAZOLE (PRILOSEC) 20  MG CAPSULE    Take 20 mg by mouth 2 (two) times daily.   OXYCODONE-ACETAMINOPHEN (PERCOCET/ROXICET) 5-325 MG TABLET    Take 1-2 tablets by mouth every 4 (four) hours as needed for moderate pain.   SEMAGLUTIDE, 1 MG/DOSE, 2 MG/1.5ML SOPN    Inject 1 mg into the skin every Sunday.    SODIUM CHLORIDE (MURO 128) 5 % OPHTHALMIC OINTMENT    Place 1 application into both eyes at bedtime.   TELMISARTAN-HYDROCHLOROTHIAZIDE (MICARDIS HCT) 80-12.5 MG TABLET    Take 1 tablet by mouth daily.   VERAPAMIL (VERELAN PM) 120 MG 24 HR CAPSULE    Take 120 mg by mouth daily.  Modified Medications   No medications on file  Discontinued Medications   CEPHALEXIN (KEFLEX) 500 MG CAPSULE    Take 1 capsule (500 mg total) by mouth 3 (three) times daily.    Subjective: Alexis Choi is in with her husband for her routine follow-up visit.  She underwent a laminectomy and fusion at the L4-5 level on 02/12/2020. Postoperatively she had some fever and continued wound drainage and was  put on empiric cephalexin for about 6 weeks.  Her wound eventually closed but she started having progressively more severe back pain.  MRI revealed evidence of lumbar discitis, osteomyelitis and epidural fluid collection.  IR drained about 15 cc of cloudy fluid.  Gram-positive cocci were seen on stain and cultures grew MSSA.  She was discharged on IV cefazolin and oral rifampin.  She completed 6 weeks of IV antibiotic therapy on 06/17/2020 before converting to oral cephalexin.  Not been any change in her neck back pain.  She still has some intermittent stomach upset but feels like she is tolerating cephalexin well.  She says that she had an x-ray done at Dr. Arnoldo Morale office recently and that everything "looked good".  Review of Systems: Review of Systems  Constitutional: Negative for fever.  Gastrointestinal: Negative for diarrhea.  Musculoskeletal: Positive for back pain.    Past Medical History:  Diagnosis Date  . Chronic back pain   . Diabetes mellitus   . Dysrhythmia    pat  . Edema    lower extremity  . Encounter for long-term (current) use of other medications   . Family history of adverse reaction to anesthesia    mother slow to wake  . Fluid retention   . GERD (gastroesophageal reflux disease)   . Hyperlipidemia    no meds  . Hypertension   . IBS (irritable bowel syndrome)   . Osteoarthritis of knee   . Pneumonia    several  . PONV (postoperative nausea and vomiting)    occ  . Unspecified vitamin D deficiency     Social History   Tobacco Use  . Smoking status: Never Smoker  . Smokeless tobacco: Never Used  Vaping Use  . Vaping Use: Never used  Substance Use Topics  . Alcohol use: No  . Drug use: No    No family history on file.  Allergies  Allergen Reactions  . Latex Itching, Rash and Other (See Comments)    NO Band-Aids!!  . Eggs Or Egg-Derived Products Nausea And Vomiting  . Januvia [Sitagliptin] Palpitations and Rash  . Metformin And Related Itching,  Palpitations and Rash    Objective: Vitals:   02/23/21 1447  Weight: 265 lb (120.2 kg)   Body mass index is 50.07 kg/m.  Physical Exam Constitutional:      Comments: She is calm and in good spirits.  Pulmonary:     Effort: Pulmonary effort is normal.  Psychiatric:        Mood and Affect: Mood normal.     Lab Results Sed Rate (mm/h)  Date Value  12/29/2020 31 (H)  09/29/2020 36 (H)  07/28/2020 36 (H)   CRP (mg/L)  Date Value  12/29/2020 28.2 (H)  09/29/2020 30.5 (H)  07/28/2020 22.9 (H)    Problem List Items Addressed This Visit      High   Lumbar discitis    Although her inflammatory markers remain elevated and she has had good clinical improvement and I think there is a very good chance that her infection has been cured.  She says that she is ready to stop cephalexin now.  She will follow-up in 6 weeks.          Michel Bickers, MD [facility] for [facility] [phone] pager   [phone] cell 02/23/2021, 2:55 PM

## 2021-03-23 ENCOUNTER — Telehealth: Payer: Self-pay

## 2021-03-23 NOTE — Telephone Encounter (Signed)
Please see if she can come in tomorrow at 10:15 AM or at least have an E visit.

## 2021-03-23 NOTE — Telephone Encounter (Signed)
Patient scheduled for tomorrow 03/23/21 and will do a video visit.  [name] T Pricilla Loveless

## 2021-03-23 NOTE — Telephone Encounter (Signed)
Patient called complains of severe back pain that radiates to both sides x 2 days. The pain does make it difficult for her to stand and walk. Patient reports having fever and chills. Patient stated she started back taking the Keflex yesterday. Patient has reached out to Dr. Lovell Sheehan office (Neurosurgeon) but has not received a call back yet. Patient however was able to speak to the on call provider with Dr. Lovell Sheehan office and was prescribed hydrocodone, which has given her some relief.  Please advise

## 2021-03-24 ENCOUNTER — Other Ambulatory Visit: Payer: Self-pay

## 2021-03-24 ENCOUNTER — Encounter: Payer: Self-pay | Admitting: Internal Medicine

## 2021-03-24 ENCOUNTER — Telehealth (INDEPENDENT_AMBULATORY_CARE_PROVIDER_SITE_OTHER): Payer: BC Managed Care – PPO | Admitting: Internal Medicine

## 2021-03-24 DIAGNOSIS — M4646 Discitis, unspecified, lumbar region: Secondary | ICD-10-CM

## 2021-03-24 NOTE — Progress Notes (Signed)
Virtual Visit via Video Note  I connected with [name] on 03/24/21 at 10:15 AM EDT by a video enabled telemedicine application and verified that I am speaking with the correct person using two identifiers.  Location: Patient: Home Provider: [facility]   I discussed the limitations of evaluation and management by telemedicine and the availability of in person appointments. The patient expressed understanding and agreed to proceed.  History of Present Illness: I conducted a video visit with Alexis Choi today.  She completed a long course of cephalexin for postoperative MSSA lumbar infection on 02/23/2021.  When she woke 2 days ago she developed severe lower back "spasms" and severe pain.  She spoke with her neurosurgeon, Dr. Delma Officer who has ordered an MRI scan.  She started back on cephalexin 2 days ago shortly after the pain began.  She feels like her pain is slightly better but she still has great difficulty getting up and using her walker to get to the bathroom.  She has not had any fever, diarrhea or dysuria.   Observations/Objective:   Assessment and Plan: I shared her concern that her infection has relapsed. She does not want to go to the hospital at this time but I told her that she may need to have her pain prevents her from being able to care for herself at home.  Follow Up Instructions: Continue cephalexin for now Await scheduling of lumbar MRI Follow-up video visit on 03/30/2021   I discussed the assessment and treatment plan with the patient. The patient was provided an opportunity to ask questions and all were answered. The patient agreed with the plan and demonstrated an understanding of the instructions.   The patient was advised to call back or seek an in-person evaluation if the symptoms worsen or if the condition fails to improve as anticipated.  I provided 14 minutes of non-face-to-face time during this encounter.   Cliffton Asters, MD

## 2021-03-29 ENCOUNTER — Telehealth: Payer: Self-pay

## 2021-03-29 NOTE — Telephone Encounter (Signed)
Patient called office today requesting MD review MRI results done at [facility]. Informed patient that office is not able to view these results, requested she have results faxed office for provider to review.  MRI was ordered by Dr. Lovell Sheehan. Patient understands he will need to look at these results as well. Juanita Laster, RMA

## 2021-03-30 ENCOUNTER — Telehealth (INDEPENDENT_AMBULATORY_CARE_PROVIDER_SITE_OTHER): Payer: BC Managed Care – PPO | Admitting: Internal Medicine

## 2021-03-30 ENCOUNTER — Other Ambulatory Visit: Payer: Self-pay

## 2021-03-30 ENCOUNTER — Encounter: Payer: Self-pay | Admitting: Internal Medicine

## 2021-03-30 DIAGNOSIS — M4646 Discitis, unspecified, lumbar region: Secondary | ICD-10-CM | POA: Diagnosis not present

## 2021-03-30 NOTE — Progress Notes (Signed)
Virtual Visit via Video Note  I connected with Alexis Choi on 03/30/21 at  2:15 PM EDT by a video enabled telemedicine application and verified that I am speaking with the correct person using two identifiers.  Location: Patient: Home Provider: [facility]   I discussed the limitations of evaluation and management by telemedicine and the availability of in person appointments. The patient expressed understanding and agreed to proceed.  History of Present Illness: I conducted a video visit with Alexis Choi today.  She restarted cephalexin last week after developing sudden worsening of her chronic back pain.  She was concerned that her MSSA infection was back.  She thinks her pain may be slightly better.  She has not had any more fever or chills since starting cephalexin.  She underwent a lumbar MRI on 03/25/2021.  It showed:  1.  Wide laminectomy at L3-4 and L4-5 without significant residual or recurrent stenosis. 2.  Diffuse epidural enhancement and mild muscular enhancement.  This most likely represents postoperative fibrosis.  Infection is not excluded, but is considered less likely.  No peripherally enhancing fluid collection is present.  Thecal sac is less compressed at L4-5 than on the prior exam. 3.  Stable moderate right foraminal narrowing at L5-S1. 4.  Fluid around the facet joints bilaterally at L3-4 without significant enhancement.  This may be reactive.  CT could be used for further evaluation of osseous structures.   Observations/Objective: Sed Rate (mm/h)  Date Value  12/29/2020 31 (H)  09/29/2020 36 (H)  07/28/2020 36 (H)   CRP (mg/L)  Date Value  12/29/2020 28.2 (H)  09/29/2020 30.5 (H)  07/28/2020 22.9 (H)    Assessment and Plan: I am not sure that her acute worsening pain is due to relapsed infection it is difficult to exclude completely.  She favors continuing cephalexin for now.  Follow Up Instructions: Continue cephalexin Follow-up here for blood work on  04/13/2021   I discussed the assessment and treatment plan with the patient. The patient was provided an opportunity to ask questions and all were answered. The patient agreed with the plan and demonstrated an understanding of the instructions.   The patient was advised to call back or seek an in-person evaluation if the symptoms worsen or if the condition fails to improve as anticipated.  I provided 15 minutes of non-face-to-face time during this encounter.   Cliffton Asters, MD

## 2021-04-13 ENCOUNTER — Other Ambulatory Visit: Payer: Self-pay

## 2021-04-13 ENCOUNTER — Ambulatory Visit: Payer: BC Managed Care – PPO | Admitting: Internal Medicine

## 2021-04-13 ENCOUNTER — Encounter: Payer: Self-pay | Admitting: Internal Medicine

## 2021-04-13 DIAGNOSIS — R197 Diarrhea, unspecified: Secondary | ICD-10-CM | POA: Insufficient documentation

## 2021-04-13 DIAGNOSIS — M4646 Discitis, unspecified, lumbar region: Secondary | ICD-10-CM

## 2021-04-13 DIAGNOSIS — K580 Irritable bowel syndrome with diarrhea: Secondary | ICD-10-CM | POA: Insufficient documentation

## 2021-04-13 NOTE — Assessment & Plan Note (Signed)
It remains unclear to me whether or not her recent increased back pain is due to relapse of her MSSA infection.  She will get blood work for sed rate and C-reactive protein today, continue cephalexin and follow-up next week after she meets with Dr. Lovell Sheehan.

## 2021-04-13 NOTE — Progress Notes (Signed)
[facility]  Patient Active Problem List   Diagnosis Date Noted  . Lumbar discitis 05/05/2020    Priority: High  . S/P lumbar fusion 05/06/2020    Priority: Medium  . Spondylolisthesis of lumbar region 02/12/2020    Priority: Medium  . Diarrhea 04/13/2021  . Long term (current) use of antibiotics 05/30/2020  . Type 2 diabetes mellitus without complications (HCC) 05/06/2020  . Hyperlipidemia 05/06/2020  . Hypertension 05/06/2020  . Morbid obesity (HCC) 05/06/2020    Patient's Medications  New Prescriptions   No medications on file  Previous Medications   ACETAMINOPHEN (TYLENOL) 650 MG CR TABLET    Take 1,300 mg by mouth in the morning and at bedtime.   ASPIRIN 81 MG TABLET    Take 81 mg by mouth at bedtime.    B COMPLEX VITAMINS CAPSULE    Take 1 capsule by mouth daily.   CELECOXIB (CELEBREX) 200 MG CAPSULE    Take 200 mg by mouth 2 (two) times daily.    CEPHALEXIN (KEFLEX) 500 MG CAPSULE    Take 500 mg by mouth 3 (three) times daily.   CHOLECALCIFEROL (VITAMIN D-3) 25 MCG (1000 UT) CAPS    Take 1,000 Units by mouth in the morning and at bedtime.   CYCLOBENZAPRINE (FLEXERIL) 10 MG TABLET    Take 1 tablet (10 mg total) by mouth 3 (three) times daily as needed for muscle spasms.   DICYCLOMINE (BENTYL) 10 MG CAPSULE    Take 10 mg by mouth 2 (two) times daily.   GABAPENTIN (NEURONTIN) 600 MG TABLET    Take 600 mg by mouth 3 (three) times daily.   HYDROCODONE-ACETAMINOPHEN (NORCO/VICODIN) 5-325 MG TABLET    Take by mouth.   HYOSCYAMINE (LEVSIN SL) 0.125 MG SL TABLET    Place 0.125 mg under the tongue daily as needed for cramping.    INSULIN DISPOSABLE PUMP (OMNIPOD 5 PACK) MISC       INSULIN DISPOSABLE PUMP (OMNIPOD DASH 5 PACK PODS) MISC    USE 1 POD EVERY 2 DAYS   INSULIN GLARGINE (BASAGLAR KWIKPEN) 100 UNIT/ML    Inject 50 Units into the skin daily after breakfast.   INSULIN PEN NEEDLE (PEN NEEDLES 31GX5/16") 31G X 8 MM MISC    by Does not  apply route.   MAGNESIUM GLUCONATE 250 MG TABS    Take 250 mg by mouth at bedtime.   MULTIPLE VITAMIN (MULTIVITAMIN) CAPSULE    Take 1 capsule by mouth daily.   NOVOLOG 100 UNIT/ML INJECTION    Inject into the skin.   NYSTATIN CREAM (MYCOSTATIN)    Apply 1 application topically See admin instructions. Apply to perineal area once a day   OMEPRAZOLE (PRILOSEC) 20 MG CAPSULE    Take 20 mg by mouth 2 (two) times daily.   ONETOUCH ULTRA TEST STRIP    1 STRIP UP TO 6 TIMES DAILY   OZEMPIC, 1 MG/DOSE, 4 MG/3ML SOPN    1 (ONE) MILLIGRAM UNDER SKIN WEEKLY   SODIUM CHLORIDE (MURO 128) 5 % OPHTHALMIC OINTMENT    Place 1 application into both eyes at bedtime.   TELMISARTAN-HYDROCHLOROTHIAZIDE (MICARDIS HCT) 80-12.5 MG TABLET    Take 1 tablet by mouth daily.   VERAPAMIL (VERELAN PM) 120 MG 24 HR CAPSULE    Take 120 mg by mouth daily.  Modified Medications   No medications on file  Discontinued Medications   No medications on file    Subjective:  Alexis Choi is in for her routine follow-up visit.  She completed a long course of cephalexin for postoperative MSSA lumbar infection on 02/23/2021.  Her inflammatory markers were normal at that time.  She developed sudden worsening of her lower back pain and spasms on 03/22/2021.  She restarted cephalexin that day.  She is also been started back on hydrocodone.  Her pain is under better control.  She is having more stomach upset and heartburn since restarting cephalexin.  She says that she gets sweaty and nauseous when she takes cephalexin.  The symptoms were resolved spontaneously in about 30 minutes.  She has been having intermittent watery diarrhea.  She has not had any fever.  A recent repeat MRI scan showed fluid around the facet joints bilaterally at L3-4 without significant enhancement.  She underwent a lumbar CT scan on 04/07/2021 which did not comment on any unusual fluid collections but did note significant lucency around the L5 screws bilaterally compatible with  loosening.  She follows up with her neurosurgeon, Dr. Delma Officer, in 1 week.  Review of Systems: Review of Systems  Constitutional: Negative for chills, diaphoresis and fever.  Gastrointestinal: Positive for abdominal pain, diarrhea, heartburn and nausea. Negative for vomiting.  Musculoskeletal: Positive for back pain and joint pain.    Past Medical History:  Diagnosis Date  . Chronic back pain   . Diabetes mellitus   . Dysrhythmia    pat  . Edema    lower extremity  . Encounter for long-term (current) use of other medications   . Family history of adverse reaction to anesthesia    mother slow to wake  . Fluid retention   . GERD (gastroesophageal reflux disease)   . Hyperlipidemia    no meds  . Hypertension   . IBS (irritable bowel syndrome)   . Osteoarthritis of knee   . Pneumonia    several  . PONV (postoperative nausea and vomiting)    occ  . Unspecified vitamin D deficiency     Social History   Tobacco Use  . Smoking status: Never Smoker  . Smokeless tobacco: Never Used  Vaping Use  . Vaping Use: Never used  Substance Use Topics  . Alcohol use: No  . Drug use: No    No family history on file.  Allergies  Allergen Reactions  . Latex Itching, Rash and Other (See Comments)    NO Band-Aids!!  . Eggs Or Egg-Derived Products Nausea And Vomiting  . Januvia [Sitagliptin] Palpitations and Rash  . Metformin And Related Itching, Palpitations and Rash    Objective: Vitals:   04/13/21 1508  BP: 137/83  Pulse: 96  Temp: (!) 97.4 F (36.3 C)  TempSrc: Oral  SpO2: 95%  Weight: 260 lb (117.9 kg)  Height: 5\' 1"  (1.549 m)   Body mass index is 49.13 kg/m.  Physical Exam Constitutional:      Comments: She is accompanied by her husband.  Cardiovascular:     Rate and Rhythm: Normal rate and regular rhythm.     Heart sounds: No murmur heard.   Pulmonary:     Effort: Pulmonary effort is normal.     Breath sounds: Normal breath sounds.  Abdominal:      Palpations: Abdomen is soft.     Tenderness: There is no abdominal tenderness.  Psychiatric:        Mood and Affect: Mood normal.     Lab Results    Problem List Items Addressed This Visit  High   Lumbar discitis    It remains unclear to me whether or not her recent increased back pain is due to relapse of her MSSA infection.  She will get blood work for sed rate and C-reactive protein today, continue cephalexin and follow-up next week after she meets with Dr. Lovell Sheehan.      Relevant Orders   C-reactive protein   Sedimentation rate     Unprioritized   Diarrhea    She will bring a stool sample in for C. difficile PCR testing.      Relevant Orders   Clostridium Difficile by PCR([facility])       Cliffton Asters, MD [facility] [phone] pager   [phone] cell 04/13/2021, 3:34 PM

## 2021-04-13 NOTE — Assessment & Plan Note (Signed)
She will bring a stool sample in for C. difficile PCR testing.

## 2021-04-14 LAB — SEDIMENTATION RATE: Sed Rate: 50 mm/h — ABNORMAL HIGH (ref 0–30)

## 2021-04-14 LAB — C-REACTIVE PROTEIN: CRP: 32.5 mg/L — ABNORMAL HIGH (ref <8.0)

## 2021-04-20 ENCOUNTER — Other Ambulatory Visit: Payer: BC Managed Care – PPO

## 2021-04-20 ENCOUNTER — Other Ambulatory Visit: Payer: Self-pay

## 2021-04-20 DIAGNOSIS — R197 Diarrhea, unspecified: Secondary | ICD-10-CM

## 2021-04-21 LAB — C. DIFFICILE GDH AND TOXIN A/B
GDH ANTIGEN: NOT DETECTED
MICRO NUMBER:: 11952830
SPECIMEN QUALITY:: ADEQUATE
TOXIN A AND B: NOT DETECTED

## 2021-04-22 ENCOUNTER — Encounter: Payer: Self-pay | Admitting: Internal Medicine

## 2021-04-22 ENCOUNTER — Other Ambulatory Visit: Payer: Self-pay

## 2021-04-22 ENCOUNTER — Telehealth (INDEPENDENT_AMBULATORY_CARE_PROVIDER_SITE_OTHER): Payer: BC Managed Care – PPO | Admitting: Internal Medicine

## 2021-04-22 DIAGNOSIS — M4646 Discitis, unspecified, lumbar region: Secondary | ICD-10-CM | POA: Diagnosis not present

## 2021-04-22 MED ORDER — CEPHALEXIN 500 MG PO CAPS: 500.0000 mg | ORAL_CAPSULE | Freq: Three times a day (TID) | ORAL | 11 refills | Status: DC

## 2021-04-22 NOTE — Progress Notes (Signed)
Virtual Visit via Video Note  I connected with Alexis Choi on 04/22/21 at 11:00 AM EDT by a video enabled telemedicine application and verified that I am speaking with the correct person using two identifiers.  Location: Patient: Home Provider: [facility]   I discussed the limitations of evaluation and management by telemedicine and the availability of in person appointments. The patient expressed understanding and agreed to proceed.  History of Present Illness: I conducted a video visit with Alexis Choi today.  She is feeling better gradually since restarting Keflex 2 months ago.  She has also changed her pain medication.  She is able to get around a little better.  She met with her neurosurgeon, Dr. Jeff Jenkins, 2 days ago.  He outlined the possibility of further spinal fusion surgery if she is not improving.  She states that she wants to simply continue taking Keflex for now.    Observations/Objective: Sed Rate (mm/h)  Date Value  04/13/2021 50 (H)  12/29/2020 31 (H)  09/29/2020 36 (H)   CRP (mg/L)  Date Value  04/13/2021 32.5 (H)  12/29/2020 28.2 (H)  09/29/2020 30.5 (H)    Assessment and Plan: It is certainly possible that her recent increased back pain could be due to persistent MSSA lumbar infection.  Follow Up Instructions: Continue Keflex for now Up in 2 months   I discussed the assessment and treatment plan with the patient. The patient was provided an opportunity to ask questions and all were answered. The patient agreed with the plan and demonstrated an understanding of the instructions.   The patient was advised to call back or seek an in-person evaluation if the symptoms worsen or if the condition fails to improve as anticipated.  I provided 14 minutes of non-face-to-face time during this encounter.   John Campbell, MD  

## 2021-06-22 ENCOUNTER — Other Ambulatory Visit: Payer: Self-pay

## 2021-06-22 ENCOUNTER — Telehealth (INDEPENDENT_AMBULATORY_CARE_PROVIDER_SITE_OTHER): Payer: BC Managed Care – PPO | Admitting: Internal Medicine

## 2021-06-22 DIAGNOSIS — M4646 Discitis, unspecified, lumbar region: Secondary | ICD-10-CM | POA: Diagnosis not present

## 2021-06-22 NOTE — Progress Notes (Signed)
Virtual Visit via Video Note  I connected with Alexis Choi on 06/22/21 at  1:45 PM EDT by a video enabled telemedicine application and verified that I am speaking with the correct person using two identifiers.  Location: Patient: Home Provider: [facility]   I discussed the limitations of evaluation and management by telemedicine and the availability of in person appointments. The patient expressed understanding and agreed to proceed.  History of Present Illness: I conducted a video visit with Alexis Choi today. She completed a long course of cephalexin for postoperative MSSA lumbar infection on 02/23/2021.  She developed sudden worsening of her lower back pain and spasms on 03/22/2021.  She restarted cephalexin that day.  She is also been started back on hydrocodone.  She is having more stomach upset and heartburn since restarting cephalexin.  She says that she gets sweaty and nauseous when she takes cephalexin.  The symptoms were resolved spontaneously in about 30 minutes.  She has been having intermittent watery diarrhea.  Her GI symptoms have improved significantly when she takes omeprazole before her cephalexin.  She has not had any fever.  A recent repeat MRI scan showed fluid around the facet joints bilaterally at L3-4 without significant enhancement.  She underwent a lumbar CT scan on 04/07/2021 which did not comment on any unusual fluid collections but did note significant lucency around the L5 screws bilaterally compatible with loosening.  She met with her neurosurgeon, Dr. Earle Gell, and they decided that no more surgery was indicated at this time.  She says that her pain is under much better control now.   Observations/Objective: Sed Rate (mm/h)  Date Value  04/13/2021 50 (H)  12/29/2020 31 (H)  09/29/2020 36 (H)   CRP (mg/L)  Date Value  04/13/2021 32.5 (H)  12/29/2020 28.2 (H)  09/29/2020 30.5 (H)     Assessment and Plan: I am not sure if her pain is improved because of being  back on cephalexin or because of being back on hydrocodone or both.  Follow Up Instructions: She prefers to continue cephalexin for now Follow-up for repeat blood work in 6 months   I discussed the assessment and treatment plan with the patient. The patient was provided an opportunity to ask questions and all were answered. The patient agreed with the plan and demonstrated an understanding of the instructions.   The patient was advised to call back or seek an in-person evaluation if the symptoms worsen or if the condition fails to improve as anticipated.  I provided 15 minutes of non-face-to-face time during this encounter.   Michel Bickers, MD

## 2021-11-24 ENCOUNTER — Other Ambulatory Visit (HOSPITAL_BASED_OUTPATIENT_CLINIC_OR_DEPARTMENT_OTHER): Payer: Self-pay

## 2021-11-24 MED ORDER — OZEMPIC (2 MG/DOSE) 8 MG/3ML ~~LOC~~ SOPN
PEN_INJECTOR | SUBCUTANEOUS | 6 refills | Status: DC
  Filled 2021-11-24: qty 9, 84d supply, fill #0
  Filled 2022-03-21: qty 9, 84d supply, fill #1
  Filled 2022-06-20: qty 3, 28d supply, fill #2

## 2021-11-24 MED ORDER — FARXIGA 5 MG PO TABS
ORAL_TABLET | ORAL | 4 refills | Status: DC
  Filled 2021-11-24: qty 30, 30d supply, fill #0
  Filled 2021-12-29: qty 90, 90d supply, fill #1
  Filled 2022-03-21: qty 30, 30d supply, fill #2

## 2021-11-25 ENCOUNTER — Other Ambulatory Visit (HOSPITAL_BASED_OUTPATIENT_CLINIC_OR_DEPARTMENT_OTHER): Payer: Self-pay

## 2021-11-26 ENCOUNTER — Other Ambulatory Visit (HOSPITAL_BASED_OUTPATIENT_CLINIC_OR_DEPARTMENT_OTHER): Payer: Self-pay

## 2021-12-23 ENCOUNTER — Ambulatory Visit: Payer: Medicare PPO | Admitting: Internal Medicine

## 2021-12-23 ENCOUNTER — Encounter: Payer: Self-pay | Admitting: Internal Medicine

## 2021-12-23 ENCOUNTER — Ambulatory Visit: Payer: BC Managed Care – PPO | Admitting: Internal Medicine

## 2021-12-23 ENCOUNTER — Other Ambulatory Visit: Payer: Self-pay

## 2021-12-23 DIAGNOSIS — M4646 Discitis, unspecified, lumbar region: Secondary | ICD-10-CM | POA: Diagnosis not present

## 2021-12-23 NOTE — Assessment & Plan Note (Addendum)
I am very hopeful that her lumbar infection has been cured.  I told her that she is feeling is much more important than any of her lab work.  Not concerned about very mild leukocytosis or persistent elevation of her inflammatory markers since she is not having any worsening back pain. She will remain off of cephalexin and follow-up in 3 months.

## 2021-12-23 NOTE — Progress Notes (Signed)
[facility]  Patient Active Problem List   Diagnosis Date Noted   Lumbar discitis 05/05/2020    Priority: High   S/P lumbar fusion 05/06/2020    Priority: Medium    Spondylolisthesis of lumbar region 02/12/2020    Priority: Medium    Diarrhea 04/13/2021   Long term (current) use of antibiotics 05/30/2020   Type 2 diabetes mellitus without complications (Alexis Choi) 72/07/4708   Hyperlipidemia 05/06/2020   Hypertension 05/06/2020   Morbid obesity (Alexis Choi) 05/06/2020    Patient's Medications  New Prescriptions   No medications on file  Previous Medications   ACETAMINOPHEN (TYLENOL) 650 MG CR TABLET    Take 1,300 mg by mouth in the morning and at bedtime.   ALBUTEROL (VENTOLIN HFA) 108 (90 BASE) MCG/ACT INHALER    SMARTSIG:2 Puff(s) Via Inhaler   ASPIRIN 81 MG TABLET    Take 81 mg by mouth at bedtime.    B COMPLEX VITAMINS CAPSULE    Take 1 capsule by mouth daily.   CELECOXIB (CELEBREX) 200 MG CAPSULE    Take 200 mg by mouth 2 (two) times daily.    CHOLECALCIFEROL (VITAMIN D-3) 25 MCG (1000 UT) CAPS    Take 1,000 Units by mouth in the morning and at bedtime.   CYCLOBENZAPRINE (FLEXERIL) 10 MG TABLET    Take 1 tablet (10 mg total) by mouth 3 (three) times daily as needed for muscle spasms.   DAPAGLIFLOZIN PROPANEDIOL (FARXIGA) 5 MG TABS TABLET    1 tablet Orally Once a day 30 day(s)   DICYCLOMINE (BENTYL) 10 MG CAPSULE    Take 10 mg by mouth 2 (two) times daily.   FLUCONAZOLE (DIFLUCAN) 150 MG TABLET    Take by mouth.   GABAPENTIN (NEURONTIN) 600 MG TABLET    Take 600 mg by mouth 3 (three) times daily.   HYDROCODONE-ACETAMINOPHEN (NORCO/VICODIN) 5-325 MG TABLET    Take by mouth.   HYOSCYAMINE (LEVSIN SL) 0.125 MG SL TABLET    Place 0.125 mg under the tongue daily as needed for cramping.    INSULIN DISPOSABLE PUMP (OMNIPOD 5 PACK) MISC       INSULIN DISPOSABLE PUMP (OMNIPOD DASH 5 PACK PODS) MISC    USE 1 POD EVERY 2 DAYS   INSULIN GLARGINE (BASAGLAR  KWIKPEN) 100 UNIT/ML    Inject 50 Units into the skin daily after breakfast.   INSULIN GLARGINE (LANTUS) 100 UNIT/ML INJECTION    Inject 40 Units into the skin daily.   INSULIN PEN NEEDLE (PEN NEEDLES 31GX5/16") 31G X 8 MM MISC    by Does not apply route.   MAGNESIUM GLUCONATE 250 MG TABS    Take 250 mg by mouth at bedtime.   MULTIPLE VITAMIN (MULTIVITAMIN) CAPSULE    Take 1 capsule by mouth daily.   NOVOLOG 100 UNIT/ML INJECTION    Inject into the skin.   NYSTATIN CREAM (MYCOSTATIN)    Apply 1 application topically See admin instructions. Apply to perineal area once a day   OMEPRAZOLE (PRILOSEC) 20 MG CAPSULE    Take 20 mg by mouth 2 (two) times daily.   ONETOUCH ULTRA TEST STRIP    1 STRIP UP TO 6 TIMES DAILY   OZEMPIC, 1 MG/DOSE, 4 MG/3ML SOPN    1 (ONE) MILLIGRAM UNDER SKIN WEEKLY   SEMAGLUTIDE, 2 MG/DOSE, (OZEMPIC, 2 MG/DOSE,) 8 MG/3ML SOPN    2 mg Subcutaneous Once a week 30 days   SODIUM CHLORIDE (MURO 128)  5 % OPHTHALMIC OINTMENT    Place 1 application into both eyes at bedtime.   TELMISARTAN-HYDROCHLOROTHIAZIDE (MICARDIS HCT) 80-12.5 MG TABLET    Take 1 tablet by mouth daily.   TRIAMCINOLONE CREAM (KENALOG) 0.1 %    Apply topically 2 (two) times daily as needed.   VERAPAMIL (VERELAN PM) 120 MG 24 HR CAPSULE    Take 120 mg by mouth daily.  Modified Medications   No medications on file  Discontinued Medications   AZITHROMYCIN (ZITHROMAX) 500 MG TABLET    Take 500 mg by mouth daily.   CEPHALEXIN (KEFLEX) 500 MG CAPSULE    Take 1 capsule (500 mg total) by mouth 3 (three) times daily.    Subjective: Alexis Choi is in for her routine follow-up visit. She underwent decompressive laminectomy and fusion at the L4-5 level on 02/12/2020.  Postoperatively she had some fever and continued wound drainage and was put on empiric cephalexin for about 6 weeks.  Her wound eventually closed but she started having progressively more severe back pain.  MRI revealed evidence of lumbar discitis, osteomyelitis and  epidural fluid  collection.  IR drained about 15 cc of cloudy fluid.  Gram-positive cocci were seen on stain and cultures grew MSSA.  She was discharged on IV cefazolin and oral rifampin.  She completed 6 weeks of IV antibiotic therapy on 06/17/2020 before converting to oral cephalexin.  She stopped cephalexin last April but then developed some slightly worse back spasms and restarted it in May of last year.  Her inflammatory markers have been consistently elevated but her pain has been relatively mild (2 out of 10) and stable.  She tells me that she decided to stop cephalexin again shortly after Christmas.  She has not had any increasing pain or other problems to suggest relapse of her infection.  Unfortunately, she did not note any improvement in her chronic abdominal pain, intermittent nausea and mild diarrhea after stopping cephalexin.  She recently stopped her baby aspirin and Celebrex to see if that would help.  He was recently started on iron supplements and feels like she has more energy.  Review of Systems: Review of Systems  Constitutional:  Negative for fever.  Gastrointestinal:  Positive for abdominal pain, diarrhea, nausea and vomiting. Negative for heartburn.  Musculoskeletal:  Positive for back pain.   Past Medical History:  Diagnosis Date   Chronic back pain    Diabetes mellitus    Dysrhythmia    pat   Edema    lower extremity   Encounter for long-term (current) use of other medications    Family history of adverse reaction to anesthesia    mother slow to wake   Fluid retention    GERD (gastroesophageal reflux disease)    Hyperlipidemia    no meds   Hypertension    IBS (irritable bowel syndrome)    Osteoarthritis of knee    Pneumonia    several   PONV (postoperative nausea and vomiting)    occ   Unspecified vitamin D deficiency     Social History   Tobacco Use   Smoking status: Never   Smokeless tobacco: Never  Vaping Use   Vaping Use: Never used  Substance  Use Topics   Alcohol use: No   Drug use: No    No family history on file.  Allergies  Allergen Reactions   Latex Itching, Rash and Other (See Comments)    NO Band-Aids!!   Eggs Or Egg-Derived Products Nausea And Vomiting  Januvia [Sitagliptin] Palpitations and Rash   Metformin And Related Itching, Palpitations and Rash    Objective: Vitals:   12/23/21 1120  BP: (!) 147/78  Pulse: 89  Temp: (!) 97.5 F (36.4 C)  TempSrc: Oral  SpO2: 96%  Weight: 258 lb (117 kg)  Height: 5' (1.524 m)   Body mass index is 50.39 kg/m.  Physical Exam Constitutional:      Comments: She is calm and in good spirits.  Cardiovascular:     Rate and Rhythm: Normal rate.  Pulmonary:     Effort: Pulmonary effort is normal.  Abdominal:     Palpations: Abdomen is soft.     Tenderness: There is no abdominal tenderness.  Neurological:     General: No focal deficit present.     Gait: Gait normal.  Psychiatric:        Mood and Affect: Mood normal.    Lab Results 12/08/2021 WBC 12,900 ESR 40 CRP 58   Problem List Items Addressed This Visit       High   Lumbar discitis    I am very hopeful that her lumbar infection has been cured.  I told her that she is feeling is much more important than any of her lab work.  Not concerned about very mild leukocytosis or persistent elevation of her inflammatory markers since she is not having any worsening back pain. She will remain off of cephalexin and follow-up in 3 months.          Michel Bickers, MD [facility] [phone] pager   [phone] cell 12/23/2021, 11:45 AM

## 2021-12-29 ENCOUNTER — Other Ambulatory Visit (HOSPITAL_BASED_OUTPATIENT_CLINIC_OR_DEPARTMENT_OTHER): Payer: Self-pay

## 2022-01-17 ENCOUNTER — Other Ambulatory Visit (HOSPITAL_BASED_OUTPATIENT_CLINIC_OR_DEPARTMENT_OTHER): Payer: Self-pay

## 2022-01-31 DIAGNOSIS — Z0000 Encounter for general adult medical examination without abnormal findings: Secondary | ICD-10-CM | POA: Diagnosis not present

## 2022-02-08 DIAGNOSIS — R195 Other fecal abnormalities: Secondary | ICD-10-CM | POA: Diagnosis not present

## 2022-02-16 DIAGNOSIS — E1169 Type 2 diabetes mellitus with other specified complication: Secondary | ICD-10-CM | POA: Diagnosis not present

## 2022-02-17 DIAGNOSIS — E119 Type 2 diabetes mellitus without complications: Secondary | ICD-10-CM | POA: Diagnosis not present

## 2022-02-17 DIAGNOSIS — H524 Presbyopia: Secondary | ICD-10-CM | POA: Diagnosis not present

## 2022-02-24 DIAGNOSIS — Z6841 Body Mass Index (BMI) 40.0 and over, adult: Secondary | ICD-10-CM | POA: Diagnosis not present

## 2022-02-24 DIAGNOSIS — J40 Bronchitis, not specified as acute or chronic: Secondary | ICD-10-CM | POA: Diagnosis not present

## 2022-02-24 DIAGNOSIS — J029 Acute pharyngitis, unspecified: Secondary | ICD-10-CM | POA: Diagnosis not present

## 2022-02-24 DIAGNOSIS — J329 Chronic sinusitis, unspecified: Secondary | ICD-10-CM | POA: Diagnosis not present

## 2022-03-10 DIAGNOSIS — N3001 Acute cystitis with hematuria: Secondary | ICD-10-CM | POA: Diagnosis not present

## 2022-03-10 DIAGNOSIS — Z6841 Body Mass Index (BMI) 40.0 and over, adult: Secondary | ICD-10-CM | POA: Diagnosis not present

## 2022-03-10 DIAGNOSIS — K921 Melena: Secondary | ICD-10-CM | POA: Diagnosis not present

## 2022-03-21 ENCOUNTER — Other Ambulatory Visit (HOSPITAL_BASED_OUTPATIENT_CLINIC_OR_DEPARTMENT_OTHER): Payer: Self-pay

## 2022-03-21 DIAGNOSIS — E785 Hyperlipidemia, unspecified: Secondary | ICD-10-CM | POA: Diagnosis not present

## 2022-03-21 DIAGNOSIS — I10 Essential (primary) hypertension: Secondary | ICD-10-CM | POA: Diagnosis not present

## 2022-03-21 DIAGNOSIS — E1169 Type 2 diabetes mellitus with other specified complication: Secondary | ICD-10-CM | POA: Diagnosis not present

## 2022-03-21 DIAGNOSIS — M4326 Fusion of spine, lumbar region: Secondary | ICD-10-CM | POA: Diagnosis not present

## 2022-03-21 DIAGNOSIS — E781 Pure hyperglyceridemia: Secondary | ICD-10-CM | POA: Diagnosis not present

## 2022-03-21 DIAGNOSIS — E8881 Metabolic syndrome: Secondary | ICD-10-CM | POA: Diagnosis not present

## 2022-03-21 DIAGNOSIS — Z794 Long term (current) use of insulin: Secondary | ICD-10-CM | POA: Diagnosis not present

## 2022-03-21 DIAGNOSIS — Z6841 Body Mass Index (BMI) 40.0 and over, adult: Secondary | ICD-10-CM | POA: Diagnosis not present

## 2022-03-21 MED ORDER — FARXIGA 5 MG PO TABS
ORAL_TABLET | ORAL | 1 refills | Status: DC
  Filled 2022-03-21 – 2022-05-04 (×2): qty 90, 90d supply, fill #0
  Filled 2022-08-03: qty 90, 90d supply, fill #1

## 2022-03-22 ENCOUNTER — Other Ambulatory Visit (HOSPITAL_BASED_OUTPATIENT_CLINIC_OR_DEPARTMENT_OTHER): Payer: Self-pay

## 2022-04-04 ENCOUNTER — Encounter: Payer: Self-pay | Admitting: Gastroenterology

## 2022-04-15 DIAGNOSIS — Z6841 Body Mass Index (BMI) 40.0 and over, adult: Secondary | ICD-10-CM | POA: Diagnosis not present

## 2022-04-15 DIAGNOSIS — L409 Psoriasis, unspecified: Secondary | ICD-10-CM | POA: Diagnosis not present

## 2022-04-15 DIAGNOSIS — E1142 Type 2 diabetes mellitus with diabetic polyneuropathy: Secondary | ICD-10-CM | POA: Diagnosis not present

## 2022-04-15 DIAGNOSIS — E1136 Type 2 diabetes mellitus with diabetic cataract: Secondary | ICD-10-CM | POA: Diagnosis not present

## 2022-04-15 DIAGNOSIS — I10 Essential (primary) hypertension: Secondary | ICD-10-CM | POA: Diagnosis not present

## 2022-04-15 DIAGNOSIS — Z794 Long term (current) use of insulin: Secondary | ICD-10-CM | POA: Diagnosis not present

## 2022-04-15 DIAGNOSIS — B379 Candidiasis, unspecified: Secondary | ICD-10-CM | POA: Diagnosis not present

## 2022-04-15 DIAGNOSIS — E785 Hyperlipidemia, unspecified: Secondary | ICD-10-CM | POA: Diagnosis not present

## 2022-04-19 DIAGNOSIS — M4316 Spondylolisthesis, lumbar region: Secondary | ICD-10-CM | POA: Diagnosis not present

## 2022-04-19 DIAGNOSIS — N3946 Mixed incontinence: Secondary | ICD-10-CM | POA: Diagnosis not present

## 2022-04-19 DIAGNOSIS — S32009K Unspecified fracture of unspecified lumbar vertebra, subsequent encounter for fracture with nonunion: Secondary | ICD-10-CM | POA: Diagnosis not present

## 2022-04-25 DIAGNOSIS — E782 Mixed hyperlipidemia: Secondary | ICD-10-CM | POA: Diagnosis not present

## 2022-04-25 DIAGNOSIS — E039 Hypothyroidism, unspecified: Secondary | ICD-10-CM | POA: Diagnosis not present

## 2022-04-25 DIAGNOSIS — B999 Unspecified infectious disease: Secondary | ICD-10-CM | POA: Diagnosis not present

## 2022-04-25 DIAGNOSIS — D638 Anemia in other chronic diseases classified elsewhere: Secondary | ICD-10-CM | POA: Diagnosis not present

## 2022-04-25 DIAGNOSIS — E559 Vitamin D deficiency, unspecified: Secondary | ICD-10-CM | POA: Diagnosis not present

## 2022-04-25 DIAGNOSIS — Z79899 Other long term (current) drug therapy: Secondary | ICD-10-CM | POA: Diagnosis not present

## 2022-04-25 DIAGNOSIS — D649 Anemia, unspecified: Secondary | ICD-10-CM | POA: Diagnosis not present

## 2022-04-25 DIAGNOSIS — E1169 Type 2 diabetes mellitus with other specified complication: Secondary | ICD-10-CM | POA: Diagnosis not present

## 2022-04-25 DIAGNOSIS — R195 Other fecal abnormalities: Secondary | ICD-10-CM | POA: Diagnosis not present

## 2022-04-25 DIAGNOSIS — E669 Obesity, unspecified: Secondary | ICD-10-CM | POA: Diagnosis not present

## 2022-04-25 IMAGING — CT CT GUIDANCE NEEDLE PLACEMENT
2 of 3 series · 12 of 32 positions shown, 18 images · non-contrast
Comparison: none

CLINICAL DATA: Back pain. Previous laminectomy and PLIF from L3
through L5. There is
diffuse edema and enhancement within the soft tissues surrounding
the operative levels, including the psoas and erector spinae
muscles. Small amount of nonspecific fluid within the laminectomy
bed. In addition, there is low-level marrow edema and enhancement
within the L4 and L5 vertebral bodies. Infection cannot be excluded.

[Series 3: i-spiral 5.0 b40f · axial · 0.59mm/px · z∈[+965,+1000]mm · 3 of 45 slices shown]
[im 5/45  soft-tissue]
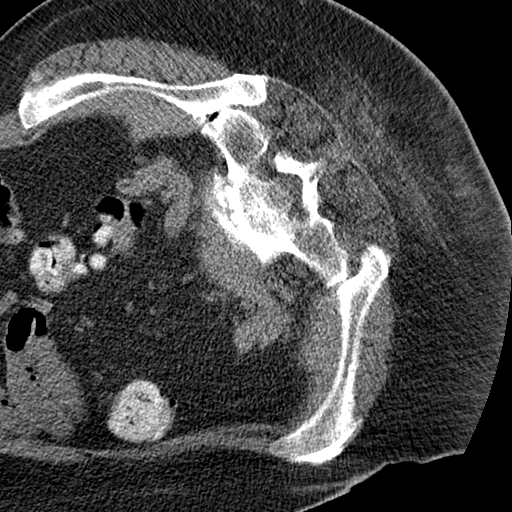
[im 10/45  soft-tissue]
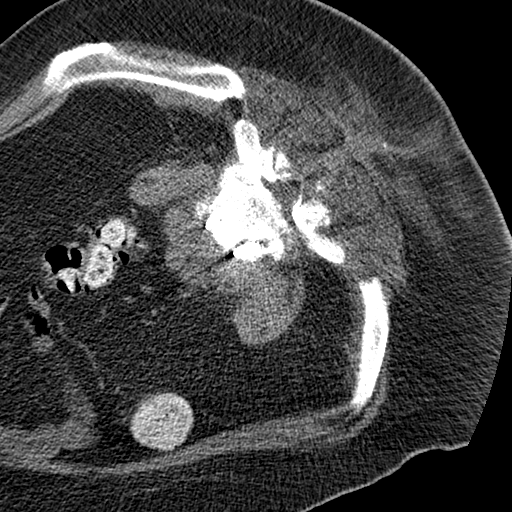
[im 15/45  soft-tissue]
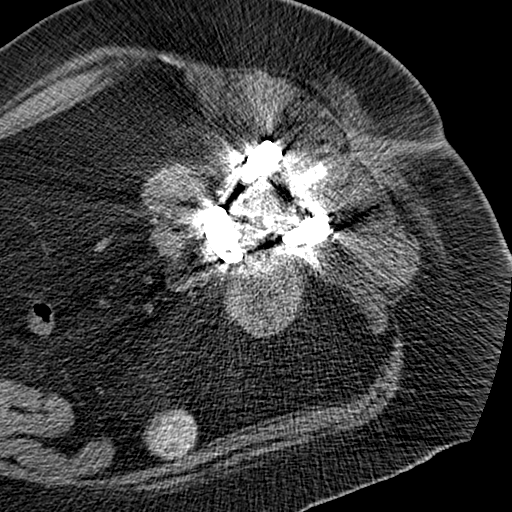

[Series 4: i-spiral 5.0 (person_name)40(person_name) (person_ · axial · 0.59mm/px · z∈[+965,+1087]mm · 9 of 45 slices shown, 15 images]
[im 5/45  soft-tissue]
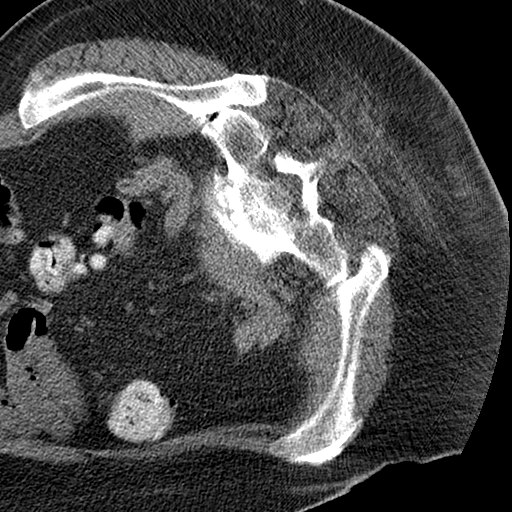
[im 5/45  bone]
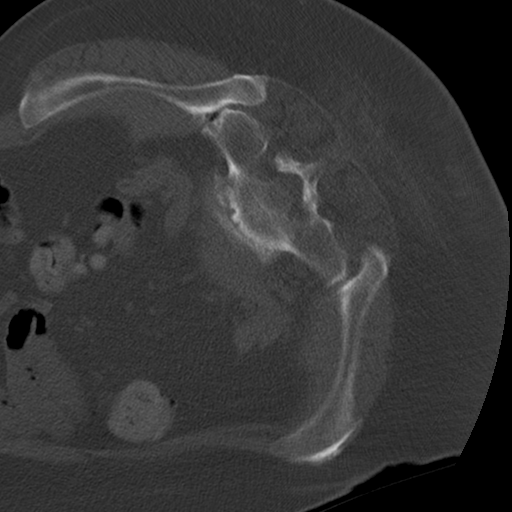
[im 9/45  soft-tissue]
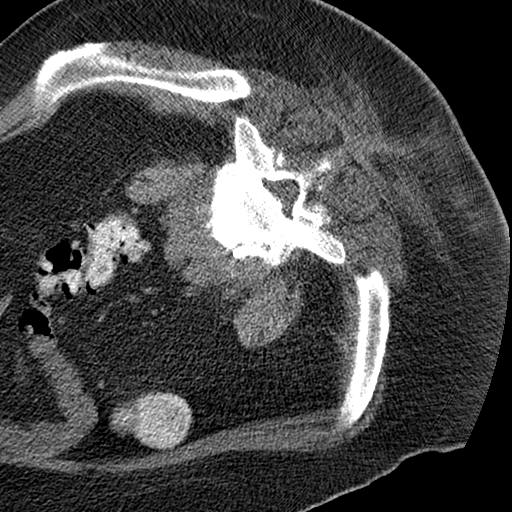
[im 14/45  soft-tissue]
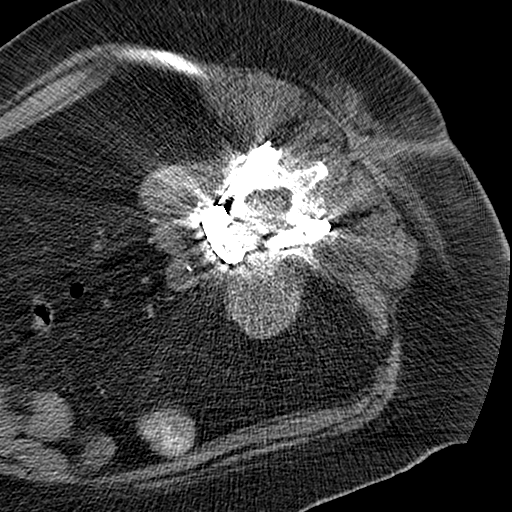
[im 18/45  soft-tissue]
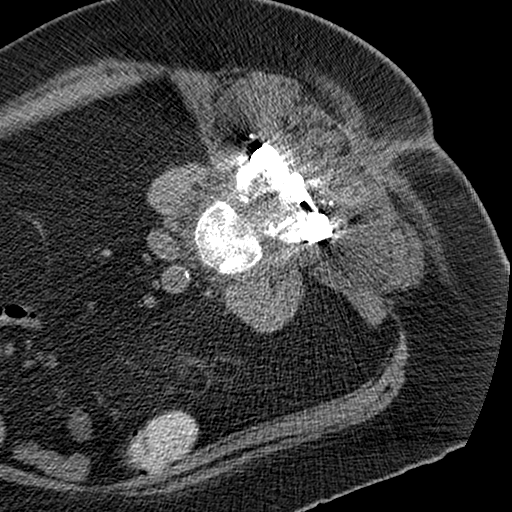
[im 23/45  soft-tissue]
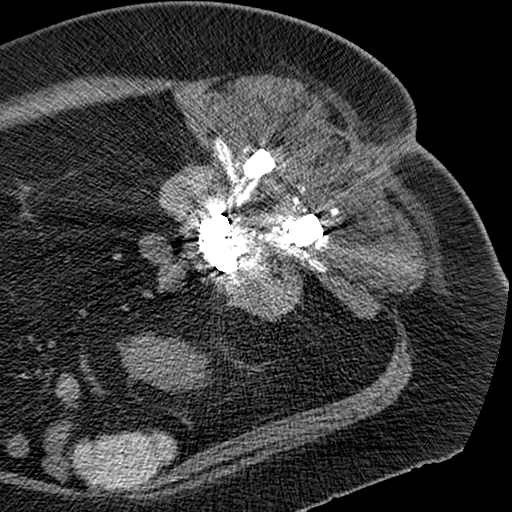
[im 27/45  soft-tissue]
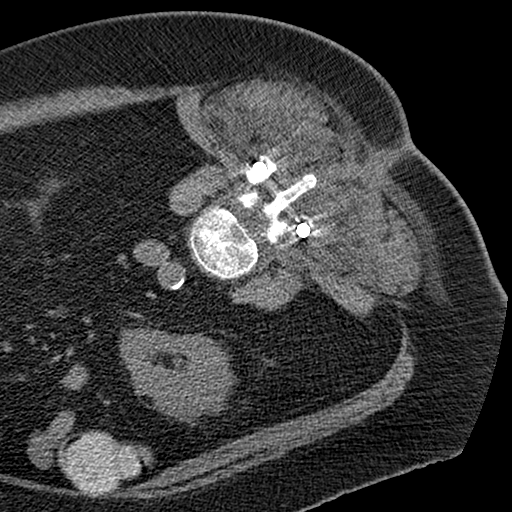
[im 27/45  lung]
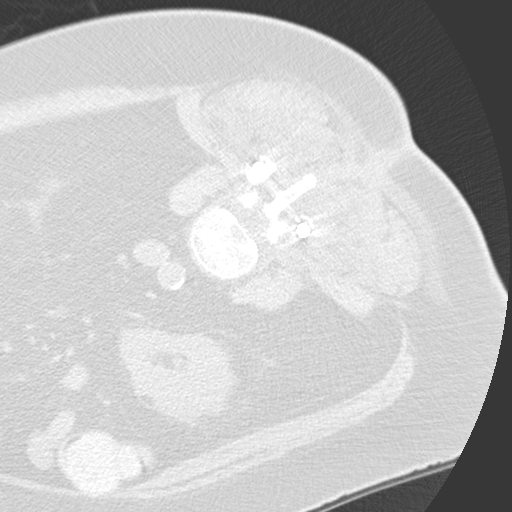
[im 31/45  soft-tissue]
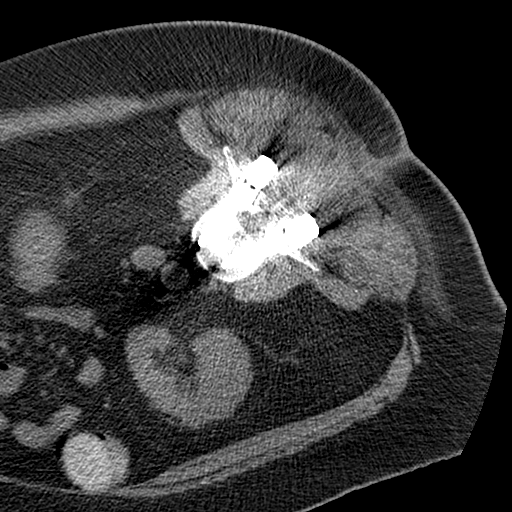
[im 31/45  lung]
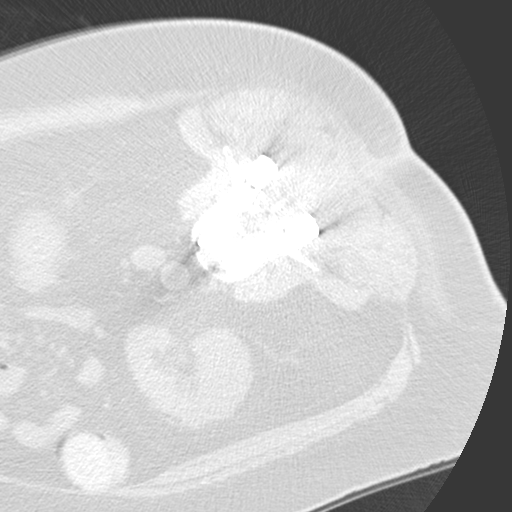
[im 36/45  soft-tissue]
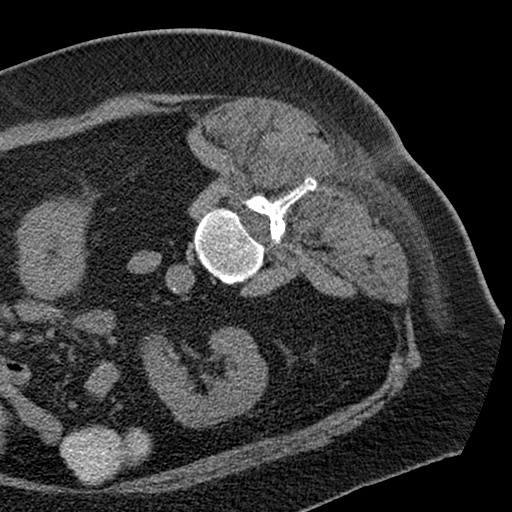
[im 36/45  lung]
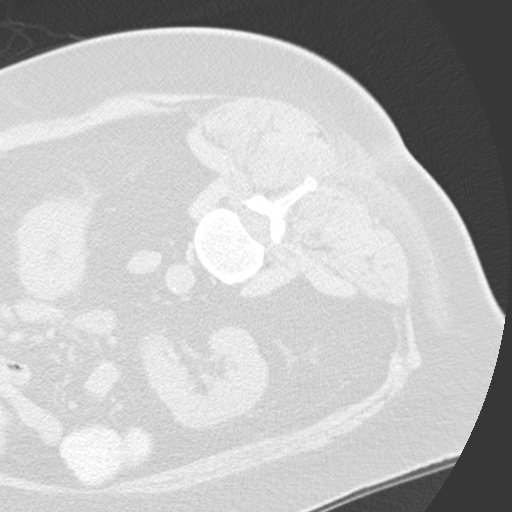
[im 40/45  soft-tissue]
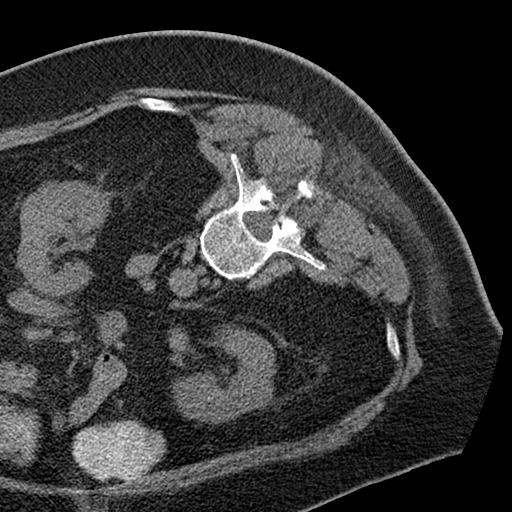
[im 40/45  lung]
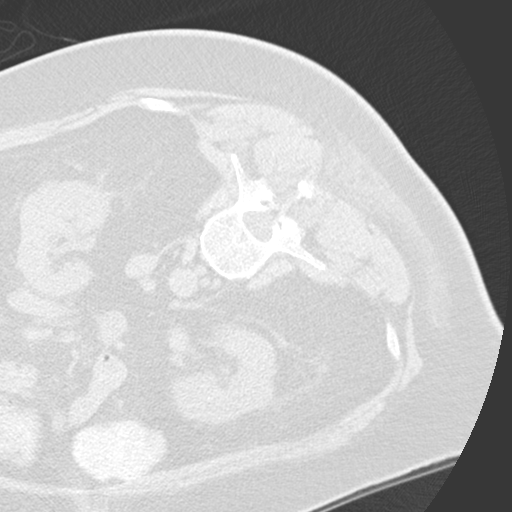
[im 40/45  bone]
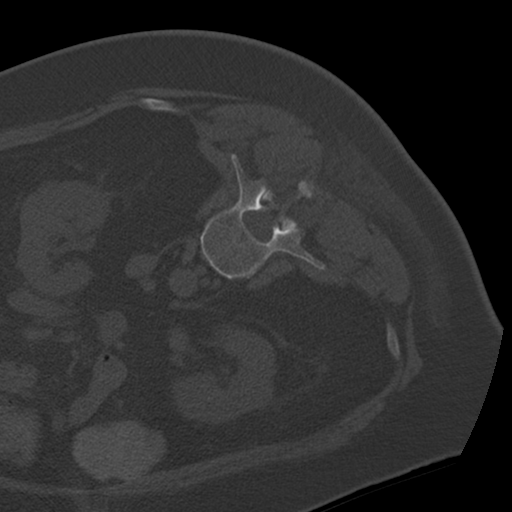

[12 of 32 positions shown; findings below may reference images not displayed]

EXAM:
CT GUIDED ASPIRATION BIOPSY OF  LUMBAR PARASPINAL COLLECTION

ANESTHESIA/SEDATION:
Intravenous Fentanyl 841mcg and Versed 1.5mg were administered as
conscious sedation during continuous monitoring of the patient's
level of consciousness and physiological / cardiorespiratory status
by the radiology RN, with a total moderate sedation time of 20
minutes.

PROCEDURE:
The procedure risks, benefits, and alternatives were explained to
the patient. Questions regarding the procedure were encouraged and
answered. The patient understands and consents to the procedure.

Patient placed LAO and select axial scans through the lumbar region
obtained. The collection was localized based on direct comparison to
recent MRI and an appropriate skin entry site determined and marked.

The operative field was prepped with chlorhexidinein a sterile
fashion, and a sterile drape was applied covering the operative
field. A sterile gown and sterile gloves were used for the
procedure. Local anesthesia was provided with 1% Lidocaine.

Under intermittent CT fluoroscopic guidance, 18 gauge trocar needle
advanced into the posterior paraspinal collection in the midline. 15
mL thin cloudy yellow fluid aspirated, sent for Gram stain and
culture. Postprocedure scans show no hemorrhage or other apparent
complication. The patient tolerated the procedure well.

COMPLICATIONS:
None immediate
FINDINGS: The posterior paraspinal fluid collection was localized based on
direct comparison to previous MRI. 15 mL cloudy thin fluid were
aspirated.
IMPRESSION: 1. Technically successful CT-guided aspiration of lumbar
paravertebral collection.

## 2022-04-26 ENCOUNTER — Other Ambulatory Visit: Payer: Self-pay

## 2022-04-26 ENCOUNTER — Ambulatory Visit (INDEPENDENT_AMBULATORY_CARE_PROVIDER_SITE_OTHER): Payer: Medicare PPO | Admitting: Internal Medicine

## 2022-04-26 DIAGNOSIS — M4646 Discitis, unspecified, lumbar region: Secondary | ICD-10-CM

## 2022-04-26 NOTE — Progress Notes (Signed)
Virtual Visit via Telephone Note  I connected with Alexis Choi on 04/26/22 at  2:00 PM EDT by telephone and verified that I am speaking with the correct person using two identifiers.  Location: Patient: Home Provider: [facility]   I discussed the limitations, risks, security and privacy concerns of performing an evaluation and management service by telephone and the availability of in person appointments. I also discussed with the patient that there may be a patient responsible charge related to this service. The patient expressed understanding and agreed to proceed.   History of Present Illness: I called and spoke with Alexis Choi today.  She stopped taking chronic, suppressive cephalexin in December for her remote history of MSSA postoperative lumbar infection.  She has had no increase in back pain or other signs or symptoms of relapse.  She saw her neurosurgeon, Dr. Delma Officer, recently and was told that her x-ray shows that her lumbar spine is fused appropriately following surgery.   Observations/Objective:   Assessment and Plan: It appears that her MSSA postoperative lumbar infection has been cured.  Follow Up Instructions: Follow-up here as needed   I discussed the assessment and treatment plan with the patient. The patient was provided an opportunity to ask questions and all were answered. The patient agreed with the plan and demonstrated an understanding of the instructions.   The patient was advised to call back or seek an in-person evaluation if the symptoms worsen or if the condition fails to improve as anticipated.  I provided 14 minutes of non-face-to-face time during this encounter.   Cliffton Asters, MD

## 2022-05-03 ENCOUNTER — Ambulatory Visit: Payer: Medicare PPO | Admitting: Gastroenterology

## 2022-05-03 ENCOUNTER — Encounter: Payer: Self-pay | Admitting: Gastroenterology

## 2022-05-05 ENCOUNTER — Other Ambulatory Visit (HOSPITAL_COMMUNITY): Payer: Self-pay

## 2022-05-09 ENCOUNTER — Other Ambulatory Visit (HOSPITAL_COMMUNITY): Payer: Self-pay

## 2022-05-17 DIAGNOSIS — E1169 Type 2 diabetes mellitus with other specified complication: Secondary | ICD-10-CM | POA: Diagnosis not present

## 2022-05-20 DIAGNOSIS — Z6841 Body Mass Index (BMI) 40.0 and over, adult: Secondary | ICD-10-CM | POA: Diagnosis not present

## 2022-05-20 DIAGNOSIS — B3731 Acute candidiasis of vulva and vagina: Secondary | ICD-10-CM | POA: Diagnosis not present

## 2022-05-20 DIAGNOSIS — J40 Bronchitis, not specified as acute or chronic: Secondary | ICD-10-CM | POA: Diagnosis not present

## 2022-05-20 DIAGNOSIS — J329 Chronic sinusitis, unspecified: Secondary | ICD-10-CM | POA: Diagnosis not present

## 2022-05-20 DIAGNOSIS — R051 Acute cough: Secondary | ICD-10-CM | POA: Diagnosis not present

## 2022-05-20 DIAGNOSIS — J309 Allergic rhinitis, unspecified: Secondary | ICD-10-CM | POA: Diagnosis not present

## 2022-05-20 DIAGNOSIS — R0982 Postnasal drip: Secondary | ICD-10-CM | POA: Diagnosis not present

## 2022-06-07 ENCOUNTER — Ambulatory Visit: Payer: Medicare PPO | Admitting: Gastroenterology

## 2022-06-07 ENCOUNTER — Encounter: Payer: Self-pay | Admitting: Gastroenterology

## 2022-06-07 VITALS — BP 110/70 | HR 85 | Ht 60.0 in | Wt 260.1 lb

## 2022-06-07 DIAGNOSIS — R195 Other fecal abnormalities: Secondary | ICD-10-CM

## 2022-06-07 DIAGNOSIS — R1013 Epigastric pain: Secondary | ICD-10-CM | POA: Diagnosis not present

## 2022-06-07 DIAGNOSIS — Z800 Family history of malignant neoplasm of digestive organs: Secondary | ICD-10-CM

## 2022-06-07 DIAGNOSIS — K580 Irritable bowel syndrome with diarrhea: Secondary | ICD-10-CM

## 2022-06-07 MED ORDER — DICYCLOMINE HCL 10 MG PO CAPS: 10.0000 mg | ORAL_CAPSULE | Freq: Three times a day (TID) | ORAL | 6 refills | Status: DC

## 2022-06-07 MED ORDER — PANTOPRAZOLE SODIUM 40 MG PO TBEC: 40.0000 mg | DELAYED_RELEASE_TABLET | Freq: Every day | ORAL | 3 refills | Status: DC

## 2022-06-07 NOTE — Progress Notes (Signed)
Chief Complaint:   Referring Provider:  Buckner Malta, MD      ASSESSMENT AND PLAN;   #1. Epi pain. Nl CBC, CMP, lipase. S/P chole in past  #2. IBS-D with element of postcholecystectomy diarrhea. Also exacerbated by meds  #3. H+ stools. Neg colon 10/2019. Nl CBC   #4. FH CRC mom at age 66. Neg colon 10/2019   Plan: -Change omeprazole to Protonix 40mg  po QD #90 -If not better in 4 weeks, then EGD at [facility] (if BMI>50) followed by [facility] if needed -Refill Bentyl 10 mg po TID #90 -Continue hysocamine S/P PRN. -Recall Colon 10/2024 -RTC 12 weeks.  Earlier, if still with problems.   HPI:    Alexis Choi is a 66 y.o. 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 Had several colonoscopies in the past by Dr. 06/25/2022 d/t FH CRC. Last colon 10/2019- neg except for sigmoid diverticulosis.  Good prep. Rpt 5 yrs.  C/O epi pain with nausea (since she has been on Ozempic, Nl CBC, CMP and lipase).  Has some heartburn despite omeprazole 20 mg p.o. twice daily.  Epigastric pain gets worse after eating.  No vomiting.  No jaundice dark urine or pale stools. Note that she is s/p cholecystectomy  Longstanding IBS-D x yrs. Bms 2-3/day (at baseline). No Noct symptoms.  Cholecystectomy did make diarrhea more prominent.  No melena or hematochezia.  She has been off antibiotics since December 2022 (after lumbar fusion).  Negative stool studies in the past.  Much better with combination of Bentyl/hyoscyamine. Note that she has to take magnesium.  No recent weight loss.  No loss of appetite.   Past GI procedures: Colonoscopy by Dr. January 2023 10/2019-good prep, moderate sigmoid diverticulosis.  Otherwise normal.  Repeat in 5 years due to family history.  EGD over 15 years ago by Dr. 11/2019 which was negative.  Past Medical History:  Diagnosis Date   Chronic back pain    Diabetes  mellitus    Dysrhythmia    pat   Edema    lower extremity   Encounter for long-term (current) use of other medications    Family history of adverse reaction to anesthesia    mother slow to wake   Fluid retention    GERD (gastroesophageal reflux disease)    Hyperlipidemia    no meds   Hypertension    IBS (irritable bowel syndrome)    Osteoarthritis of knee    Pneumonia    several   PONV (postoperative nausea and vomiting)    occ   Unspecified vitamin D deficiency     Past Surgical History:  Procedure Laterality Date   CHOLECYSTECTOMY N/A 06/30/2016   Procedure: LAPAROSCOPIC CHOLECYSTECTOMY;  Surgeon: 08/30/2016 Kinsinger, MD;  Location: [facility];  Service: General;  Laterality: N/A;   COLONOSCOPY  10/2019   Dr 11/2019 Divertoculosis of the sigmoid colon Otherwise normal colonoscopy to cecum.   KNEE ARTHROSCOPY Left 2006   knee replacment Right    right knee arthroscopy     TONSILLECTOMY  1963   TOTAL KNEE ARTHROPLASTY Left 06/03/2013   Procedure: LEFT TOTAL KNEE ARTHROPLASTY;  Surgeon: 06/05/2013, MD;  Location: [facility];  Service: Orthopedics;  Laterality: Left;   TUBAL LIGATION  1990   UPPER GI ENDOSCOPY  10/26/2011    History reviewed. No pertinent family history.  Social History   Tobacco Use  Smoking status: Never   Smokeless tobacco: Never  Vaping Use   Vaping Use: Never used  Substance Use Topics   Alcohol use: No   Drug use: No    Current Outpatient Medications  Medication Sig Dispense Refill   acetaminophen (TYLENOL) 650 MG CR tablet Take 1,300 mg by mouth in the morning and at bedtime.     albuterol (VENTOLIN HFA) 108 (90 Base) MCG/ACT inhaler SMARTSIG:2 Puff(s) Via Inhaler     aspirin 81 MG tablet Take 81 mg by mouth at bedtime.     b complex vitamins capsule Take 1 capsule by mouth daily.     celecoxib (CELEBREX) 200 MG capsule Take 200 mg by mouth 2 (two) times daily.      Cholecalciferol (VITAMIN D-3) 25 MCG (1000 UT) CAPS Take 1,000 Units by mouth  in the morning and at bedtime.     cyclobenzaprine (FLEXERIL) 10 MG tablet Take 1 tablet (10 mg total) by mouth 3 (three) times daily as needed for muscle spasms. (Patient taking differently: Take 10 mg by mouth every 6 (six) hours.) 50 tablet 1   dapagliflozin propanediol (FARXIGA) 5 MG TABS tablet 1 tablet Orally Once a day 90 days 90 tablet 1   dicyclomine (BENTYL) 10 MG capsule Take 10 mg by mouth 2 (two) times daily.     fluconazole (DIFLUCAN) 150 MG tablet Take by mouth.     gabapentin (NEURONTIN) 600 MG tablet Take 600 mg by mouth 3 (three) times daily.     hyoscyamine (LEVSIN SL) 0.125 MG SL tablet Place 0.125 mg under the tongue daily as needed for cramping.      Insulin Disposable Pump (OMNIPOD 5 PACK) MISC      Insulin Disposable Pump (OMNIPOD DASH 5 PACK PODS) MISC USE 1 POD EVERY 2 DAYS     insulin glargine (LANTUS) 100 UNIT/ML injection Inject 40 Units into the skin daily.     Insulin Pen Needle (PEN NEEDLES 31GX5/16") 31G X 8 MM MISC by Does not apply route.     Magnesium Gluconate 250 MG TABS Take 250 mg by mouth at bedtime.     Multiple Vitamin (MULTIVITAMIN) capsule Take 1 capsule by mouth daily.     NOVOLOG 100 UNIT/ML injection Inject into the skin.     nystatin cream (MYCOSTATIN) Apply 1 application topically See admin instructions. Apply to perineal area once a day     omeprazole (PRILOSEC) 20 MG capsule Take 20 mg by mouth 2 (two) times daily.     ONETOUCH ULTRA test strip 1 STRIP UP TO 6 TIMES DAILY     OZEMPIC, 1 MG/DOSE, 4 MG/3ML SOPN 1 (ONE) MILLIGRAM UNDER SKIN WEEKLY     Semaglutide, 2 MG/DOSE, (OZEMPIC, 2 MG/DOSE,) 8 MG/3ML SOPN 2 mg Subcutaneous Once a week 30 days 3 mL 6   sodium chloride (MURO 128) 5 % ophthalmic ointment Place 1 application into both eyes at bedtime.     telmisartan-hydrochlorothiazide (MICARDIS HCT) 80-12.5 MG tablet Take 1 tablet by mouth daily.     triamcinolone cream (KENALOG) 0.1 % Apply topically 2 (two) times daily as needed.      verapamil (VERELAN PM) 120 MG 24 hr capsule Take 120 mg by mouth daily.     HYDROcodone-acetaminophen (NORCO/VICODIN) 5-325 MG tablet Take by mouth. (Patient not taking: Reported on 12/23/2021)     Insulin Glargine (BASAGLAR KWIKPEN) 100 UNIT/ML Inject 50 Units into the skin daily after breakfast. (Patient not taking: Reported on 12/23/2021)  No current facility-administered medications for this visit.    Allergies  Allergen Reactions   Latex Itching, Rash and Other (See Comments)    NO Band-Aids!!   Eggs Or Egg-Derived Products Nausea And Vomiting   Januvia [Sitagliptin] Palpitations and Rash   Metformin And Related Itching, Palpitations and Rash    Review of Systems:  Constitutional: Denies fever, chills, diaphoresis, appetite change and fatigue.  HEENT: Has allergies, dry eyes. Respiratory: Denies SOB, DOE, cough, chest tightness,  and wheezing.   Cardiovascular: Denies chest pain, palpitations and leg swelling.  Genitourinary: Denies dysuria, urgency, frequency, hematuria, flank pain and difficulty urinating.  Musculoskeletal: Denies myalgias, back pain, joint swelling, arthralgias and gait problem.  Has arthritis Skin: No rash.  Neurological: Denies dizziness, seizures, syncope, weakness, light-headedness, numbness and headaches.  Hematological: Denies adenopathy. Easy bruising, personal or family bleeding history  Psychiatric/Behavioral: No anxiety or depression     Physical Exam:    BP 110/70   Pulse 85   Ht 5' (1.524 m)   Wt 260 lb 2 oz (118 kg)   LMP  (LMP Unknown)   BMI 50.80 kg/m  Wt Readings from Last 3 Encounters:  06/07/22 260 lb 2 oz (118 kg)  12/23/21 258 lb (117 kg)  04/13/21 260 lb (117.9 kg)   Constitutional:  Well-developed, in no acute distress. Psychiatric: Normal mood and affect. Behavior is normal. HEENT: Pupils normal.  Conjunctivae are normal. No scleral icterus. Neck supple.  Cardiovascular: Normal rate, regular rhythm. No  edema Pulmonary/chest: Effort normal and breath sounds normal. No wheezing, rales or rhonchi. Abdominal: Soft, nondistended.  Mild epi tenderness. Bowel sounds active throughout. There are no masses palpable. No hepatomegaly. Rectal: Deferred Neurological: Alert and oriented to person place and time. Skin: Skin is warm and dry. No rashes noted.  Data Reviewed: I have personally reviewed following labs and imaging studies From 04/25/2022: -Normal CBC with hemoglobin 12.4, MCV 84, platelets 282, hemoglobin A1c 7.7 -Nl B12, TSH, CMP including LFTs with AST 21, ALT 20 -Iron studies were normal except for saturation of 14%    Edman Circle, MD 06/07/2022, 11:26 AM  Cc: Buckner Malta, MD

## 2022-06-07 NOTE — Patient Instructions (Addendum)
If you are age 66 or older, your body mass index should be between 23-30. Your Body mass index is 50.8 kg/m. If this is out of the aforementioned range listed, please consider follow up with your Primary Care Provider.  If you are age 27 or younger, your body mass index should be between 19-25. Your Body mass index is 50.8 kg/m. If this is out of the aformentioned range listed, please consider follow up with your Primary Care Provider.   ________________________________________________________  The [place] [facility] providers would like to encourage you to use [facility] to communicate with providers for non-urgent requests or questions.  Due to long hold times on the telephone, sending your provider a message by [facility] may be a faster and more efficient way to get a response.  Please allow 48 business hours for a response.  Please remember that this is for non-urgent requests.  _______________________________________________________  We have sent the following medications to your pharmacy for you to pick up at your convenience: Protonix 40mg  daily Bentyl  10mg  3 times a day  Stop omeprazole. Continue Levsin  Call in 4 weeks with an update to the nurse  Repeat Colonoscopy in 10-2024. Please call 2 months prior to December to schedule.  Call in 3 months to schedule an office visit.  Thank you,  Dr. 11-2024

## 2022-06-20 ENCOUNTER — Other Ambulatory Visit (HOSPITAL_BASED_OUTPATIENT_CLINIC_OR_DEPARTMENT_OTHER): Payer: Self-pay

## 2022-06-21 ENCOUNTER — Other Ambulatory Visit (HOSPITAL_BASED_OUTPATIENT_CLINIC_OR_DEPARTMENT_OTHER): Payer: Self-pay

## 2022-06-21 DIAGNOSIS — Z794 Long term (current) use of insulin: Secondary | ICD-10-CM | POA: Diagnosis not present

## 2022-06-21 DIAGNOSIS — E785 Hyperlipidemia, unspecified: Secondary | ICD-10-CM | POA: Diagnosis not present

## 2022-06-21 DIAGNOSIS — E1169 Type 2 diabetes mellitus with other specified complication: Secondary | ICD-10-CM | POA: Diagnosis not present

## 2022-06-21 DIAGNOSIS — I10 Essential (primary) hypertension: Secondary | ICD-10-CM | POA: Diagnosis not present

## 2022-06-21 MED ORDER — OZEMPIC (2 MG/DOSE) 8 MG/3ML ~~LOC~~ SOPN
2.0000 mg | PEN_INJECTOR | SUBCUTANEOUS | 6 refills | Status: DC
  Filled 2022-08-03: qty 3, 28d supply, fill #0
  Filled 2022-09-19 – 2022-09-23 (×2): qty 3, 28d supply, fill #1

## 2022-08-03 ENCOUNTER — Other Ambulatory Visit (HOSPITAL_COMMUNITY): Payer: Self-pay

## 2022-08-12 DIAGNOSIS — E039 Hypothyroidism, unspecified: Secondary | ICD-10-CM | POA: Diagnosis not present

## 2022-08-12 DIAGNOSIS — B999 Unspecified infectious disease: Secondary | ICD-10-CM | POA: Diagnosis not present

## 2022-08-12 DIAGNOSIS — E1169 Type 2 diabetes mellitus with other specified complication: Secondary | ICD-10-CM | POA: Diagnosis not present

## 2022-08-12 DIAGNOSIS — D638 Anemia in other chronic diseases classified elsewhere: Secondary | ICD-10-CM | POA: Diagnosis not present

## 2022-08-12 DIAGNOSIS — D509 Iron deficiency anemia, unspecified: Secondary | ICD-10-CM | POA: Diagnosis not present

## 2022-08-12 DIAGNOSIS — E669 Obesity, unspecified: Secondary | ICD-10-CM | POA: Diagnosis not present

## 2022-08-12 DIAGNOSIS — E559 Vitamin D deficiency, unspecified: Secondary | ICD-10-CM | POA: Diagnosis not present

## 2022-08-15 DIAGNOSIS — E1169 Type 2 diabetes mellitus with other specified complication: Secondary | ICD-10-CM | POA: Diagnosis not present

## 2022-08-18 DIAGNOSIS — E039 Hypothyroidism, unspecified: Secondary | ICD-10-CM | POA: Diagnosis not present

## 2022-08-18 DIAGNOSIS — E538 Deficiency of other specified B group vitamins: Secondary | ICD-10-CM | POA: Diagnosis not present

## 2022-08-18 DIAGNOSIS — D509 Iron deficiency anemia, unspecified: Secondary | ICD-10-CM | POA: Diagnosis not present

## 2022-08-18 DIAGNOSIS — E1169 Type 2 diabetes mellitus with other specified complication: Secondary | ICD-10-CM | POA: Diagnosis not present

## 2022-08-18 DIAGNOSIS — E782 Mixed hyperlipidemia: Secondary | ICD-10-CM | POA: Diagnosis not present

## 2022-08-18 DIAGNOSIS — Z23 Encounter for immunization: Secondary | ICD-10-CM | POA: Diagnosis not present

## 2022-08-18 DIAGNOSIS — I471 Supraventricular tachycardia: Secondary | ICD-10-CM | POA: Diagnosis not present

## 2022-08-18 DIAGNOSIS — D6182 Myelophthisis: Secondary | ICD-10-CM | POA: Diagnosis not present

## 2022-08-18 DIAGNOSIS — H6993 Unspecified Eustachian tube disorder, bilateral: Secondary | ICD-10-CM | POA: Diagnosis not present

## 2022-08-18 DIAGNOSIS — E669 Obesity, unspecified: Secondary | ICD-10-CM | POA: Diagnosis not present

## 2022-09-12 DIAGNOSIS — M7632 Iliotibial band syndrome, left leg: Secondary | ICD-10-CM | POA: Diagnosis not present

## 2022-09-12 DIAGNOSIS — Z6841 Body Mass Index (BMI) 40.0 and over, adult: Secondary | ICD-10-CM | POA: Diagnosis not present

## 2022-09-20 ENCOUNTER — Other Ambulatory Visit (HOSPITAL_COMMUNITY): Payer: Self-pay

## 2022-09-20 DIAGNOSIS — I471 Supraventricular tachycardia, unspecified: Secondary | ICD-10-CM | POA: Insufficient documentation

## 2022-09-21 ENCOUNTER — Ambulatory Visit: Payer: Medicare PPO | Admitting: Internal Medicine

## 2022-09-21 DIAGNOSIS — E785 Hyperlipidemia, unspecified: Secondary | ICD-10-CM | POA: Diagnosis not present

## 2022-09-21 DIAGNOSIS — E1169 Type 2 diabetes mellitus with other specified complication: Secondary | ICD-10-CM | POA: Diagnosis not present

## 2022-09-21 DIAGNOSIS — I10 Essential (primary) hypertension: Secondary | ICD-10-CM | POA: Diagnosis not present

## 2022-09-21 DIAGNOSIS — I471 Supraventricular tachycardia, unspecified: Secondary | ICD-10-CM

## 2022-09-23 ENCOUNTER — Other Ambulatory Visit (HOSPITAL_COMMUNITY): Payer: Self-pay

## 2022-09-24 DIAGNOSIS — J069 Acute upper respiratory infection, unspecified: Secondary | ICD-10-CM | POA: Diagnosis not present

## 2022-09-24 DIAGNOSIS — J0100 Acute maxillary sinusitis, unspecified: Secondary | ICD-10-CM | POA: Diagnosis not present

## 2022-09-28 DIAGNOSIS — E1169 Type 2 diabetes mellitus with other specified complication: Secondary | ICD-10-CM | POA: Diagnosis not present

## 2022-09-28 DIAGNOSIS — E1165 Type 2 diabetes mellitus with hyperglycemia: Secondary | ICD-10-CM | POA: Diagnosis not present

## 2022-10-03 DIAGNOSIS — M7632 Iliotibial band syndrome, left leg: Secondary | ICD-10-CM | POA: Diagnosis not present

## 2022-10-03 DIAGNOSIS — Z6841 Body Mass Index (BMI) 40.0 and over, adult: Secondary | ICD-10-CM | POA: Diagnosis not present

## 2022-10-03 DIAGNOSIS — R0981 Nasal congestion: Secondary | ICD-10-CM | POA: Diagnosis not present

## 2022-10-03 DIAGNOSIS — E669 Obesity, unspecified: Secondary | ICD-10-CM | POA: Diagnosis not present

## 2022-10-03 DIAGNOSIS — J40 Bronchitis, not specified as acute or chronic: Secondary | ICD-10-CM | POA: Diagnosis not present

## 2022-10-03 DIAGNOSIS — J329 Chronic sinusitis, unspecified: Secondary | ICD-10-CM | POA: Diagnosis not present

## 2022-10-03 DIAGNOSIS — E1169 Type 2 diabetes mellitus with other specified complication: Secondary | ICD-10-CM | POA: Diagnosis not present

## 2022-10-06 ENCOUNTER — Encounter: Payer: Self-pay | Admitting: Gastroenterology

## 2022-10-06 ENCOUNTER — Ambulatory Visit: Payer: Medicare PPO | Admitting: Gastroenterology

## 2022-10-06 VITALS — BP 116/62 | HR 90 | Ht 60.0 in | Wt 262.6 lb

## 2022-10-06 DIAGNOSIS — Z800 Family history of malignant neoplasm of digestive organs: Secondary | ICD-10-CM | POA: Diagnosis not present

## 2022-10-06 DIAGNOSIS — R1013 Epigastric pain: Secondary | ICD-10-CM | POA: Diagnosis not present

## 2022-10-06 DIAGNOSIS — K580 Irritable bowel syndrome with diarrhea: Secondary | ICD-10-CM | POA: Diagnosis not present

## 2022-10-06 MED ORDER — PANTOPRAZOLE SODIUM 40 MG PO TBEC: 40.0000 mg | DELAYED_RELEASE_TABLET | Freq: Every day | ORAL | 4 refills | Status: DC

## 2022-10-06 MED ORDER — DICYCLOMINE HCL 10 MG PO CAPS: 10.0000 mg | ORAL_CAPSULE | Freq: Three times a day (TID) | ORAL | 4 refills | Status: DC

## 2022-10-06 MED ORDER — HYOSCYAMINE SULFATE 0.125 MG SL SUBL: 0.1250 mg | SUBLINGUAL_TABLET | Freq: Three times a day (TID) | SUBLINGUAL | 2 refills | Status: DC | PRN

## 2022-10-06 NOTE — Progress Notes (Signed)
Chief Complaint: FU  Referring Provider:  Buckner Malta, MD      ASSESSMENT AND PLAN;   #1. Epi pain- (resolved). Nl CBC, CMP, lipase. S/P chole in past  #2. IBS-D with element of postcholecystectomy diarrhea. Also exacerbated by meds  #3. H+ stools. Neg colon 10/2019. Nl CBC   #4. FH CRC mom at age 66. Neg colon 10/2019   Plan: -Protonix 40mg  po QD #90 4RF -Refill Bentyl 10 mg po TID #90, 4RF -Continue hysocamine 0.125mg  Q 8hrs prn #120, 2 RF -Recall Colon 10/2024 at [facility] (BMI>50) -She is to call [facility] if she starts having any problems or if she decides to proceed with EGD/further eval.  Contact numbers were given.   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  Iron Studies have improved.  Had several colonoscopies in the past by Dr. Charity fundraiser d/t FH CRC. Last colon 10/2019- neg except for sigmoid diverticulosis.  Good prep. Rpt 5 yrs.  No further epigastric pain ever since omeprazole has been switched to Protonix.  No further heartburn.  She wants to hold off on any further endoscopic evaluation currently as she feels almost 100% better.  Longstanding IBS-D x yrs. Bms 2-3/day (at baseline). No Noct symptoms.  Cholecystectomy did make diarrhea more prominent.  No melena or hematochezia.  She has been off antibiotics since December 2022 (after lumbar fusion).  Negative stool studies in the past.  Much better with combination of Bentyl/hyoscyamine. Note that she has to take magnesium.  No recent weight loss.  No loss of appetite.  Wt Readings from Last 3 Encounters:  10/06/22 262 lb 9.6 oz (119.1 kg)  06/07/22 260 lb 2 oz (118 kg)  12/23/21 258 lb (117 kg)      Past GI procedures: Colonoscopy by Dr. 02/20/22 10/2019-good prep, moderate sigmoid diverticulosis.  Otherwise normal.  Repeat in 5 years due to family history.  EGD  over 15 years ago by Dr. 11/2019 which was negative.  Past Medical History:  Diagnosis Date   Chronic back pain    Diabetes mellitus    Dysrhythmia    pat   Edema    lower extremity   Encounter for long-term (current) use of other medications    Family history of adverse reaction to anesthesia    mother slow to wake   Fluid retention    GERD (gastroesophageal reflux disease)    Hyperlipidemia    no meds   Hypertension    IBS (irritable bowel syndrome)    It band syndrome, left    Osteoarthritis of knee    Pneumonia    several   PONV (postoperative nausea and vomiting)    occ   Unspecified vitamin D deficiency     Past Surgical History:  Procedure Laterality Date   CHOLECYSTECTOMY N/A 06/30/2016   Procedure: LAPAROSCOPIC CHOLECYSTECTOMY;  Surgeon: 08/30/2016 Kinsinger, MD;  Location: [facility];  Service: General;  Laterality: N/A;   COLONOSCOPY  10/2019   Dr 11/2019 Divertoculosis of the sigmoid colon Otherwise normal colonoscopy to cecum.   KNEE ARTHROSCOPY Left 2006   knee replacment Right    right knee arthroscopy     TONSILLECTOMY  1963   TOTAL KNEE ARTHROPLASTY Left 06/03/2013   Procedure: LEFT TOTAL KNEE ARTHROPLASTY;  Surgeon: 06/05/2013, MD;  Location: [facility];  Service:  Orthopedics;  Laterality: Left;   TUBAL LIGATION  1990   UPPER GI ENDOSCOPY  10/26/2011    Family History  Problem Relation Age of Onset   Diabetes Mother    Cancer Mother    Hypertension Mother    Hypercholesterolemia Mother    Heart disease Mother    Heart disease Father    Diabetes Father    Hypercholesterolemia Father    Kidney disease Father    Hypertension Father     Social History   Tobacco Use   Smoking status: Never   Smokeless tobacco: Never  Vaping Use   Vaping Use: Never used  Substance Use Topics   Alcohol use: No   Drug use: No    Current Outpatient Medications  Medication Sig Dispense Refill   acetaminophen (TYLENOL) 650 MG CR tablet Take 1,300 mg by mouth in the  morning and at bedtime.     albuterol (VENTOLIN HFA) 108 (90 Base) MCG/ACT inhaler SMARTSIG:2 Puff(s) Via Inhaler     aspirin 81 MG tablet Take 81 mg by mouth at bedtime.     b complex vitamins capsule Take 1 capsule by mouth daily.     celecoxib (CELEBREX) 200 MG capsule Take 200 mg by mouth 2 (two) times daily.      Cholecalciferol (VITAMIN D-3) 25 MCG (1000 UT) CAPS Take 1,000 Units by mouth in the morning and at bedtime.     cyclobenzaprine (FLEXERIL) 10 MG tablet Take 1 tablet (10 mg total) by mouth 3 (three) times daily as needed for muscle spasms. (Patient taking differently: Take 10 mg by mouth every 6 (six) hours.) 50 tablet 1   dapagliflozin propanediol (FARXIGA) 5 MG TABS tablet 1 tablet Orally Once a day 90 days 90 tablet 1   dicyclomine (BENTYL) 10 MG capsule Take 1 capsule (10 mg total) by mouth 3 (three) times daily before meals. 90 capsule 6   fluconazole (DIFLUCAN) 150 MG tablet Take by mouth.     gabapentin (NEURONTIN) 600 MG tablet Take 600 mg by mouth 3 (three) times daily.     hyoscyamine (LEVSIN SL) 0.125 MG SL tablet Place 0.125 mg under the tongue daily as needed for cramping.      Insulin Disposable Pump (OMNIPOD 5 PACK) MISC      Insulin Disposable Pump (OMNIPOD DASH 5 PACK PODS) MISC USE 1 POD EVERY 2 DAYS     insulin glargine (LANTUS) 100 UNIT/ML injection Inject 40 Units into the skin daily.     Insulin Pen Needle (PEN NEEDLES 31GX5/16") 31G X 8 MM MISC by Does not apply route.     Magnesium Gluconate 250 MG TABS Take 250 mg by mouth at bedtime.     Multiple Vitamin (MULTIVITAMIN) capsule Take 1 capsule by mouth daily.     NOVOLOG 100 UNIT/ML injection Inject into the skin.     nystatin cream (MYCOSTATIN) Apply 1 application topically See admin instructions. Apply to perineal area once a day     ONETOUCH ULTRA test strip 1 STRIP UP TO 6 TIMES DAILY     OZEMPIC, 1 MG/DOSE, 4 MG/3ML SOPN 1 (ONE) MILLIGRAM UNDER SKIN WEEKLY     pantoprazole (PROTONIX) 40 MG tablet Take  1 tablet (40 mg total) by mouth daily. 90 tablet 3   Semaglutide, 2 MG/DOSE, (OZEMPIC, 2 MG/DOSE,) 8 MG/3ML SOPN 2 mg Subcutaneous Once a week 30 days 3 mL 6   Semaglutide, 2 MG/DOSE, (OZEMPIC, 2 MG/DOSE,) 8 MG/3ML SOPN Inject 2 mg into the skin  once a week. 3 mL 6   sodium chloride (MURO 128) 5 % ophthalmic ointment Place 1 application into both eyes at bedtime.     telmisartan-hydrochlorothiazide (MICARDIS HCT) 80-12.5 MG tablet Take 1 tablet by mouth daily.     triamcinolone cream (KENALOG) 0.1 % Apply topically 2 (two) times daily as needed.     verapamil (VERELAN PM) 120 MG 24 hr capsule Take 120 mg by mouth daily.     omeprazole (PRILOSEC) 20 MG capsule Take 20 mg by mouth 2 (two) times daily. (Patient not taking: Reported on 10/06/2022)     No current facility-administered medications for this visit.    Allergies  Allergen Reactions   Latex Itching, Rash and Other (See Comments)    NO Band-Aids!!   Eggs Or Egg-Derived Products Nausea And Vomiting   Januvia [Sitagliptin] Palpitations and Rash   Metformin And Related Itching, Palpitations and Rash    Review of Systems:  neg     Physical Exam:    BP 116/62   Pulse 90   Ht 5' (1.524 m)   Wt 262 lb 9.6 oz (119.1 kg)   LMP  (LMP Unknown)   SpO2 95%   BMI 51.29 kg/m  Wt Readings from Last 3 Encounters:  10/06/22 262 lb 9.6 oz (119.1 kg)  06/07/22 260 lb 2 oz (118 kg)  12/23/21 258 lb (117 kg)   Constitutional:  Well-developed, in no acute distress. Psychiatric: Normal mood and affect. Behavior is normal. HEENT: Pupils normal.  Conjunctivae are normal. No scleral icterus.  Cardiovascular: Normal rate, regular rhythm. No edema Pulmonary/chest: Effort normal and breath sounds normal. No wheezing, rales or rhonchi. Abdominal: Soft, nondistended.  Mild epi tenderness. Bowel sounds active throughout. There are no masses palpable. No hepatomegaly. Rectal: Deferred Neurological: Alert and oriented to person place and  time. Skin: Skin is warm and dry. No rashes noted.  Data Reviewed: I have personally reviewed following labs and imaging studies From 04/25/2022: -Normal CBC with hemoglobin 12.4, MCV 84, platelets 282, hemoglobin A1c 7.7 -Nl B12, TSH, CMP including LFTs with AST 21, ALT 20 -Iron studies were normal except for saturation of 14%    Edman Circle, MD 10/06/2022, 11:25 AM  Cc: Buckner Malta, MD

## 2022-10-06 NOTE — Patient Instructions (Signed)
_______________________________________________________  If you are age 66 or older, your body mass index should be between 23-30. Your Body mass index is 51.29 kg/m. If this is out of the aforementioned range listed, please consider follow up with your Primary Care Provider.  If you are age 25 or younger, your body mass index should be between 19-25. Your Body mass index is 51.29 kg/m. If this is out of the aformentioned range listed, please consider follow up with your Primary Care Provider.   ________________________________________________________  The [place] [facility] providers would like to encourage you to use [facility] to communicate with providers for non-urgent requests or questions.  Due to long hold times on the telephone, sending your provider a message by [facility] may be a faster and more efficient way to get a response.  Please allow 48 business hours for a response.  Please remember that this is for non-urgent requests.  _______________________________________________________  We have sent the following medications to your pharmacy for you to pick up at your convenience: Protonix Bentyl Levsin  Recall colonoscopy 10-2024 at [facility]. We will send a letter as it gets closer.  Thank you,  Dr. Lynann Bologna

## 2022-10-07 ENCOUNTER — Other Ambulatory Visit (HOSPITAL_BASED_OUTPATIENT_CLINIC_OR_DEPARTMENT_OTHER): Payer: Self-pay

## 2022-10-07 MED ORDER — MOUNJARO 5 MG/0.5ML ~~LOC~~ SOAJ
5.0000 mg | SUBCUTANEOUS | 1 refills | Status: DC
  Filled 2022-10-07: qty 2, 28d supply, fill #0

## 2022-10-10 ENCOUNTER — Other Ambulatory Visit (HOSPITAL_BASED_OUTPATIENT_CLINIC_OR_DEPARTMENT_OTHER): Payer: Self-pay

## 2022-10-18 DIAGNOSIS — M4316 Spondylolisthesis, lumbar region: Secondary | ICD-10-CM | POA: Diagnosis not present

## 2022-10-27 ENCOUNTER — Ambulatory Visit: Payer: Medicare PPO | Attending: Internal Medicine | Admitting: Internal Medicine

## 2022-10-27 VITALS — BP 118/76 | HR 92 | Ht 60.0 in | Wt 264.0 lb

## 2022-10-27 DIAGNOSIS — I471 Supraventricular tachycardia, unspecified: Secondary | ICD-10-CM

## 2022-10-27 DIAGNOSIS — I480 Paroxysmal atrial fibrillation: Secondary | ICD-10-CM

## 2022-10-27 MED ORDER — VERAPAMIL HCL 40 MG PO TABS: 40.0000 mg | ORAL_TABLET | ORAL | 0 refills | Status: DC | PRN

## 2022-10-27 NOTE — Patient Instructions (Signed)
Medication Instructions:  Your physician has recommended you make the following change in your medication:   ** Stop Aspirin  ** Stop Verapamil 120mg   ** Start Verapamil 40mg  - 1 tablet by mouth as needed for fast heart rate.  *If you need a refill on your cardiac medications before your next appointment, please call your pharmacy*   Lab Work: None ordered.  If you have labs (blood work) drawn today and your tests are completely normal, you will receive your results only by: MyChart Message (if you have MyChart) OR A paper copy in the mail If you have any lab test that is abnormal or we need to change your treatment, we will call you to review the results.   Testing/Procedures: Your physician has requested that you have an echocardiogram. Echocardiography is a painless test that uses sound waves to create images of your heart. It provides your doctor with information about the size and shape of your heart and how well your heart's chambers and valves are working. This procedure takes approximately one hour. There are no restrictions for this procedure. Please do NOT wear cologne, perfume, aftershave, or lotions (deodorant is allowed). Please arrive 15 minutes prior to your appointment time.     Follow-Up: At [facility], you and your health needs are our priority.  As part of our continuing mission to provide you with exceptional heart care, we have created designated Provider Care Teams.  These Care Teams include your primary Cardiologist (physician) and Advanced Practice Providers (APPs -  Physician Assistants and Nurse Practitioners) who all work together to provide you with the care you need, when you need it.  We recommend signing up for the patient portal called "MyChart".  Sign up information is provided on this After Visit Summary.  MyChart is used to connect with patients for Virtual Visits (Telemedicine).  Patients are able to view lab/test results, encounter notes,  upcoming appointments, etc.  Non-urgent messages can be sent to your provider as well.   To learn more about what you can do with MyChart, go to .    Your next appointment:   6 months with Dr [facility]             ELECTROPHYSIOLOGY CONSULT NOTE  Patient ID: Alexis Choi, MRN: Graciela Husbands, DOB/AGE: December 26, 1955 66 y.o. Admit date: (Not on file) Date of Consult: 10/27/2022   Primary Physician: 71, MD Primary Cardiologist: new     Alexis Choi is a 66 y.o. female who is being seen today for the evaluation of     at the request of          HPI Alexis Choi is a 66 y.o. female referred from [facility] because of a long history of palpitations as noted below associate with atrial tachycardia for which she had been treated with verapamil.  Recent change in the formulation has resulted in a less well-tolerated drug and perhaps less effective   Symptoms at night.  Disturbing sleep.  Often in the evening as well.   2020 underwent back surgery complicated by infection longstanding issue has been off antibiotics now for about 1 year.--Still not walking well.  Also has noted left greater than right edema.  Some years ago also had something similar venous Dopplers at that time were negative but she thinks this is distinct.   Sleeps flat.  Some sleep disordered breathing.  No daytime somnolence.   Seen 2013 for tachy palpitations with event monitor demonstrated nonsustained  atrial tachycardia, treated with verapamil with significant improvement.     DATE TEST EF    5/14 Myoview     % No ischemia             Date Cr K Hgb  11/21 1.01 4.8 12.5   6/23 0.8 4.1 12.5                Past Medical History:  Diagnosis Date   Chronic back pain     Diabetes mellitus     Dysrhythmia      pat   Edema      lower extremity   Encounter for long-term (current) use of other medications     Family history of adverse reaction to anesthesia       mother slow to wake   Fluid retention     GERD (gastroesophageal reflux disease)     Hyperlipidemia      no meds   Hypertension     IBS (irritable bowel syndrome)     It band syndrome, left     Osteoarthritis of knee     Pneumonia      several   PONV (postoperative nausea and vomiting)      occ   Unspecified vitamin D deficiency         Surgical History:       Past Surgical History:  Procedure Laterality Date   CHOLECYSTECTOMY N/A 06/30/2016    Procedure: LAPAROSCOPIC CHOLECYSTECTOMY;  Surgeon: De Blanch Kinsinger, MD;  Location: [facility];  Service: General;  Laterality: N/A;   COLONOSCOPY   10/2019    Dr Charm Barges Divertoculosis of the sigmoid colon Otherwise normal colonoscopy to cecum.   KNEE ARTHROSCOPY Left 2006   knee replacment Right     right knee arthroscopy       TONSILLECTOMY   1963   TOTAL KNEE ARTHROPLASTY Left 06/03/2013    Procedure: LEFT TOTAL KNEE ARTHROPLASTY;  Surgeon: Dannielle Huh, MD;  Location: [facility];  Service: Orthopedics;  Laterality: Left;   TUBAL LIGATION   1990   UPPER GI ENDOSCOPY   10/26/2011      Home Meds: Active Medications      Current Meds  Medication Sig   acetaminophen (TYLENOL) 650 MG CR tablet Take 1,300 mg by mouth in the morning and at bedtime.   albuterol (VENTOLIN HFA) 108 (90 Base) MCG/ACT inhaler SMARTSIG:2 Puff(s) Via Inhaler   b complex vitamins capsule Take 1 capsule by mouth daily.   celecoxib (CELEBREX) 200 MG capsule Take 200 mg by mouth 2 (two) times daily.    Cholecalciferol (VITAMIN D-3) 25 MCG (1000 UT) CAPS Take 1,000 Units by mouth in the morning and at bedtime.   cyclobenzaprine (FLEXERIL) 10 MG tablet Take 1 tablet (10 mg total) by mouth 3 (three) times daily as needed for muscle spasms. (Patient taking differently: Take 10 mg by mouth every 6 (six) hours.)   dapagliflozin propanediol (FARXIGA) 5 MG TABS tablet 1 tablet Orally Once a day 90 days   dicyclomine (BENTYL) 10 MG capsule Take 1 capsule (10 mg total) by mouth  3 (three) times daily before meals.   fluconazole (DIFLUCAN) 150 MG tablet Take by mouth. As needed   gabapentin (NEURONTIN) 600 MG tablet Take 600 mg by mouth 3 (three) times daily.   hyoscyamine (LEVSIN SL) 0.125 MG SL tablet Place 1 tablet (0.125 mg total) under the tongue every 8 (eight) hours as needed for cramping.   Insulin Disposable Pump (OMNIPOD 5  PACK) MISC     Insulin Disposable Pump (OMNIPOD DASH 5 PACK PODS) MISC USE 1 POD EVERY 2 DAYS   insulin glargine (LANTUS) 100 UNIT/ML injection Inject 40 Units into the skin daily.   Insulin Pen Needle (PEN NEEDLES 31GX5/16") 31G X 8 MM MISC by Does not apply route.   Magnesium Gluconate 250 MG TABS Take 250 mg by mouth at bedtime.   Multiple Vitamin (MULTIVITAMIN) capsule Take 1 capsule by mouth daily.   NOVOLOG 100 UNIT/ML injection Inject into the skin.   nystatin cream (MYCOSTATIN) Apply 1 application topically See admin instructions. Apply to perineal area once a day   pantoprazole (PROTONIX) 40 MG tablet Take 1 tablet (40 mg total) by mouth daily.   sodium chloride (MURO 128) 5 % ophthalmic ointment Place 1 application into both eyes at bedtime.   telmisartan-hydrochlorothiazide (MICARDIS HCT) 80-12.5 MG tablet Take 1 tablet by mouth daily.   tirzepatide [facility]) 5 MG/0.5ML Pen Inject 5 mg into the skin once a week.   triamcinolone cream (KENALOG) 0.1 % Apply topically 2 (two) times daily as needed.   [DISCONTINUED] verapamil (VERELAN PM) 120 MG 24 hr capsule Take 120 mg by mouth daily.        Allergies:       Allergies  Allergen Reactions   Latex Itching, Rash and Other (See Comments)      NO Band-Aids!!   Eggs Or Egg-Derived Products Nausea And Vomiting   Januvia [Sitagliptin] Palpitations and Rash   Metformin And Related Itching, Palpitations and Rash      Social History         Socioeconomic History   Marital status: Married      Spouse name: Not on file   Number of children: Not on file   Years of education: Not  on file   Highest education level: Not on file  Occupational History   Not on file  Tobacco Use   Smoking status: Never   Smokeless tobacco: Never  Vaping Use   Vaping Use: Never used  Substance and Sexual Activity   Alcohol use: No   Drug use: No   Sexual activity: Not on file  Other Topics Concern   Not on file  Social History Narrative   Not on file    Social Determinants of Health    Financial Resource Strain: Not on file  Food Insecurity: Not on file  Transportation Needs: Not on file  Physical Activity: Not on file  Stress: Not on file  Social Connections: Not on file  Intimate Partner Violence: Not on file           Family History  Problem Relation Age of Onset   Diabetes Mother     Cancer Mother     Hypertension Mother     Hypercholesterolemia Mother     Heart disease Mother     Heart disease Father     Diabetes Father     Hypercholesterolemia Father     Kidney disease Father     Hypertension Father        ROS:  Please see the history of present illness.    Within the year and is normal POBA is low and it may be in the context of the infection plus we have all other systems reviewed and negative.      Physical Exam:  Blood pressure 118/76, pulse 92, height 5' (1.524 m), weight 264 lb (119.7 kg), SpO2 96 %. General: Well developed, Morbidly obese  female in no acute distress. Head: Normocephalic, atraumatic, sclera non-icteric, no xanthomas, nares are without discharge. EENT: normal  Lymph Nodes:  none Neck: Negative for carotid bruits. JVD not elevated. Back:without scoliosis kyphosis Lungs: Clear bilaterally to auscultation without wheezes, rales, or rhonchi. Breathing is unlabored. Heart: RRR with S1 S2. No murmur . No rubs, or gallops appreciated. Abdomen: Soft, non-tender, non-distended with normoactive bowel sounds. No hepatomegaly. No rebound/guarding. No obvious abdominal masses. Msk:  Strength and tone appear normal for age. Extremities: No  clubbing or cyanosis. 1+ edema.  Distal pedal pulses are 2+ and equal bilaterally. Skin: Warm and Dry Neuro: Alert and oriented X 3. CN III-XII intact Grossly normal sensory and motor function . Psych:  Responds to questions appropriately with a normal affect.                     EKG sinus at 92 Interval 10/28/1933   Event recorder reviewed from 7/23 rare atrial tachycardia some suggestion of competing junctional rhythm     Assessment and Plan:  Atrial tachycardia   Unilateral swelling left leg   Morbidly obese    Back infection complicated by infection with resulting poor conditioning   Dyspnea on exertion   The patient has atrial tachycardia infrequently identified on the monitor but still with symptoms.  She has not been tolerating the new formulation of verapamil; we will discontinue it and give her short acting verapamil 40 mg to see if this is sufficient to be used on an as-needed basis for her symptoms.  In the event that it is not we can consider other things like alternative calcium blockers, a 1C agent or beta-blocker.   With her unilateral swelling of her left leg, she has been much less ambulatory since her back infection, will ask her PCP to do a venous Doppler to exclude DVT.  There is swelling all the way up to the thigh and so important to image very proximally.   Discussed her obesity and suggested she talk about this with her PCP as to strategies to try to address while she is still young   With her dyspnea on exertion, likely HFpEF in the context of obesity and age.  However, will check echocardiogram.     Blood pressure well-controlled.  Continue her losartan HCT But will need to see what happens to her blood pressure when she comes off the losartan         Sherryl MangesSteven Klein   Important Information About Sugar

## 2022-10-27 NOTE — Progress Notes (Signed)
ELECTROPHYSIOLOGY CONSULT NOTE  Patient ID: Alexis Choi, MRN: [id number], DOB/AGE: 1956-03-21 66 y.o. Admit date: (Not on file) Date of Consult: 10/27/2022  Primary Physician: Alexis Malta, MD Primary Cardiologist: new     Alexis Choi is a 66 y.o. female who is being seen today for the evaluation of palpitations at the request of Dr Alexis Choi.    HPI Alexis Choi is a 66 y.o. female referred from [facility] because of a long history of palpitations as noted below associate with atrial tachycardia for which she had been treated with verapamil.  Recent change in the formulation has resulted in a less well-tolerated drug and perhaps less effective  Symptoms at night.  Disturbing sleep.  Often in the evening as well.  2020 underwent back surgery complicated by infection longstanding issue has been off antibiotics now for about 1 year.--Still not walking well.  Also has noted left greater than right edema.  Some years ago also had something similar venous Dopplers at that time were negative but she thinks this is distinct.  Sleeps flat.  Some sleep disordered breathing.  No daytime somnolence.  Seen 2013 for tachy palpitations with event monitor demonstrated nonsustained atrial tachycardia, treated with verapamil with significant improvement.   DATE TEST EF   5/14 Myoview     % No ischemia        Date Cr K Hgb  11/21 1.01 4.8 12.5   6/23 0.8 4.1 12.5        Past Medical History:  Diagnosis Date   Chronic back pain    Diabetes mellitus    Dysrhythmia    pat   Edema    lower extremity   Encounter for long-term (current) use of other medications    Family history of adverse reaction to anesthesia    mother slow to wake   Fluid retention    GERD (gastroesophageal reflux disease)    Hyperlipidemia    no meds   Hypertension    IBS (irritable bowel syndrome)    It band syndrome, left    Osteoarthritis of knee    Pneumonia    several   PONV  (postoperative nausea and vomiting)    occ   Unspecified vitamin D deficiency       Surgical History:  Past Surgical History:  Procedure Laterality Date   CHOLECYSTECTOMY N/A 06/30/2016   Procedure: LAPAROSCOPIC CHOLECYSTECTOMY;  Surgeon: De Blanch Kinsinger, MD;  Location: [facility];  Service: General;  Laterality: N/A;   COLONOSCOPY  10/2019   Dr Charm Barges Divertoculosis of the sigmoid colon Otherwise normal colonoscopy to cecum.   KNEE ARTHROSCOPY Left 2006   knee replacment Right    right knee arthroscopy     TONSILLECTOMY  1963   TOTAL KNEE ARTHROPLASTY Left 06/03/2013   Procedure: LEFT TOTAL KNEE ARTHROPLASTY;  Surgeon: Dannielle Huh, MD;  Location: [facility] OR;  Service: Orthopedics;  Laterality: Left;   TUBAL LIGATION  1990   UPPER GI ENDOSCOPY  10/26/2011     Home Meds: Current Meds  Medication Sig   acetaminophen (TYLENOL) 650 MG CR tablet Take 1,300 mg by mouth in the morning and at bedtime.   albuterol (VENTOLIN HFA) 108 (90 Base) MCG/ACT inhaler SMARTSIG:2 Puff(s) Via Inhaler   b complex vitamins capsule Take 1 capsule by mouth daily.   celecoxib (CELEBREX) 200 MG capsule Take 200 mg by mouth 2 (two) times daily.    Cholecalciferol (VITAMIN D-3) 25 MCG (1000 UT)  CAPS Take 1,000 Units by mouth in the morning and at bedtime.   cyclobenzaprine (FLEXERIL) 10 MG tablet Take 1 tablet (10 mg total) by mouth 3 (three) times daily as needed for muscle spasms. (Patient taking differently: Take 10 mg by mouth every 6 (six) hours.)   dapagliflozin propanediol (FARXIGA) 5 MG TABS tablet 1 tablet Orally Once a day 90 days   dicyclomine (BENTYL) 10 MG capsule Take 1 capsule (10 mg total) by mouth 3 (three) times daily before meals.   fluconazole (DIFLUCAN) 150 MG tablet Take by mouth. As needed   gabapentin (NEURONTIN) 600 MG tablet Take 600 mg by mouth 3 (three) times daily.   hyoscyamine (LEVSIN SL) 0.125 MG SL tablet Place 1 tablet (0.125 mg total) under the tongue every 8 (eight) hours as  needed for cramping.   Insulin Disposable Pump (OMNIPOD 5 PACK) MISC    Insulin Disposable Pump (OMNIPOD DASH 5 PACK PODS) MISC USE 1 POD EVERY 2 DAYS   insulin glargine (LANTUS) 100 UNIT/ML injection Inject 40 Units into the skin daily.   Insulin Pen Needle (PEN NEEDLES 31GX5/16") 31G X 8 MM MISC by Does not apply route.   Magnesium Gluconate 250 MG TABS Take 250 mg by mouth at bedtime.   Multiple Vitamin (MULTIVITAMIN) capsule Take 1 capsule by mouth daily.   NOVOLOG 100 UNIT/ML injection Inject into the skin.   nystatin cream (MYCOSTATIN) Apply 1 application topically See admin instructions. Apply to perineal area once a day   pantoprazole (PROTONIX) 40 MG tablet Take 1 tablet (40 mg total) by mouth daily.   sodium chloride (MURO 128) 5 % ophthalmic ointment Place 1 application into both eyes at bedtime.   telmisartan-hydrochlorothiazide (MICARDIS HCT) 80-12.5 MG tablet Take 1 tablet by mouth daily.   tirzepatide [facility]) 5 MG/0.5ML Pen Inject 5 mg into the skin once a week.   triamcinolone cream (KENALOG) 0.1 % Apply topically 2 (two) times daily as needed.   [DISCONTINUED] verapamil (VERELAN PM) 120 MG 24 hr capsule Take 120 mg by mouth daily.    Allergies:  Allergies  Allergen Reactions   Latex Itching, Rash and Other (See Comments)    NO Band-Aids!!   Eggs Or Egg-Derived Products Nausea And Vomiting   Januvia [Sitagliptin] Palpitations and Rash   Metformin And Related Itching, Palpitations and Rash    Social History   Socioeconomic History   Marital status: Married    Spouse name: Not on file   Number of children: Not on file   Years of education: Not on file   Highest education level: Not on file  Occupational History   Not on file  Tobacco Use   Smoking status: Never   Smokeless tobacco: Never  Vaping Use   Vaping Use: Never used  Substance and Sexual Activity   Alcohol use: No   Drug use: No   Sexual activity: Not on file  Other Topics Concern   Not on file   Social History Narrative   Not on file   Social Determinants of Health   Financial Resource Strain: Not on file  Food Insecurity: Not on file  Transportation Needs: Not on file  Physical Activity: Not on file  Stress: Not on file  Social Connections: Not on file  Intimate Partner Violence: Not on file     Family History  Problem Relation Age of Onset   Diabetes Mother    Cancer Mother    Hypertension Mother    Hypercholesterolemia Mother  Heart disease Mother    Heart disease Father    Diabetes Father    Hypercholesterolemia Father    Kidney disease Father    Hypertension Father      ROS:  Please see the history of present illness.    Within the year and is normal POBA is low and it may be in the context of the infection plus we have all other systems reviewed and negative.    Physical Exam:  Blood pressure 118/76, pulse 92, height 5' (1.524 m), weight 264 lb (119.7 kg), SpO2 96 %. General: Well developed, Morbidly obese  female in no acute distress. Head: Normocephalic, atraumatic, sclera non-icteric, no xanthomas, nares are without discharge. EENT: normal  Lymph Nodes:  none Neck: Negative for carotid bruits. JVD not elevated. Back:without scoliosis kyphosis Lungs: Clear bilaterally to auscultation without wheezes, rales, or rhonchi. Breathing is unlabored. Heart: RRR with S1 S2. No murmur . No rubs, or gallops appreciated. Abdomen: Soft, non-tender, non-distended with normoactive bowel sounds. No hepatomegaly. No rebound/guarding. No obvious abdominal masses. Msk:  Strength and tone appear normal for age. Extremities: No clubbing or cyanosis. 1+ edema.  Distal pedal pulses are 2+ and equal bilaterally. Skin: Warm and Dry Neuro: Alert and oriented X 3. CN III-XII intact Grossly normal sensory and motor function . Psych:  Responds to questions appropriately with a normal affect.        EKG sinus at 92 Interval 10/28/1933  Event recorder reviewed from 7/23  rare atrial tachycardia some suggestion of competing junctional rhythm   Assessment and Plan:  Atrial tachycardia  Unilateral swelling left leg  Morbidly obese   Back infection complicated by infection with resulting poor conditioning  Dyspnea on exertion  The patient has atrial tachycardia infrequently identified on the monitor but still with symptoms.  She has not been tolerating the new formulation of verapamil; we will discontinue it and give her short acting verapamil 40 mg to see if this is sufficient to be used on an as-needed basis for her symptoms.  In the event that it is not we can consider other things like alternative calcium blockers, a 1C agent or beta-blocker.  With her unilateral swelling of her left leg, she has been much less ambulatory since her back infection, will ask her PCP to do a venous Doppler to exclude DVT.  There is swelling all the way up to the thigh and so important to image very proximally.  Discussed her obesity and suggested she talk about this with her PCP as to strategies to try to address while she is still young  With her dyspnea on exertion, likely HFpEF in the context of obesity and age.  However, will check echocardiogram.    Blood pressure well-controlled.  Continue her losartan HCT But will need to see what happens to her blood pressure when she comes off the losartan     Sherryl Manges

## 2022-10-31 ENCOUNTER — Other Ambulatory Visit (HOSPITAL_BASED_OUTPATIENT_CLINIC_OR_DEPARTMENT_OTHER): Payer: Self-pay

## 2022-10-31 MED ORDER — MOUNJARO 5 MG/0.5ML ~~LOC~~ SOAJ
5.0000 mg | SUBCUTANEOUS | 3 refills | Status: DC
  Filled 2022-10-31: qty 2, 28d supply, fill #0

## 2022-11-04 ENCOUNTER — Encounter: Payer: Self-pay | Admitting: Internal Medicine

## 2022-11-04 DIAGNOSIS — R600 Localized edema: Secondary | ICD-10-CM

## 2022-11-10 ENCOUNTER — Ambulatory Visit: Payer: Medicare PPO

## 2022-11-13 ENCOUNTER — Other Ambulatory Visit (HOSPITAL_COMMUNITY): Payer: Self-pay

## 2022-11-15 DIAGNOSIS — E1169 Type 2 diabetes mellitus with other specified complication: Secondary | ICD-10-CM | POA: Diagnosis not present

## 2022-11-19 ENCOUNTER — Other Ambulatory Visit: Payer: Self-pay | Admitting: Internal Medicine

## 2022-11-22 ENCOUNTER — Other Ambulatory Visit (HOSPITAL_COMMUNITY): Payer: Self-pay

## 2022-11-24 ENCOUNTER — Other Ambulatory Visit (HOSPITAL_COMMUNITY): Payer: Self-pay

## 2022-11-25 ENCOUNTER — Other Ambulatory Visit (HOSPITAL_COMMUNITY): Payer: Self-pay

## 2022-11-25 ENCOUNTER — Ambulatory Visit: Payer: Medicare PPO | Attending: Cardiology

## 2022-11-25 DIAGNOSIS — I3481 Nonrheumatic mitral (valve) annulus calcification: Secondary | ICD-10-CM | POA: Diagnosis not present

## 2022-11-25 DIAGNOSIS — I5030 Unspecified diastolic (congestive) heart failure: Secondary | ICD-10-CM

## 2022-11-25 DIAGNOSIS — I517 Cardiomegaly: Secondary | ICD-10-CM | POA: Diagnosis not present

## 2022-11-25 DIAGNOSIS — I471 Supraventricular tachycardia, unspecified: Secondary | ICD-10-CM

## 2022-11-25 LAB — ECHOCARDIOGRAM COMPLETE
Area-P 1/2: 3.53 cm2
S' Lateral: 2.8 cm

## 2022-11-25 MED ORDER — FARXIGA 5 MG PO TABS
5.0000 mg | ORAL_TABLET | Freq: Every day | ORAL | 3 refills | Status: DC
  Filled 2022-11-25 (×2): qty 30, 30d supply, fill #0
  Filled 2023-02-21: qty 30, 30d supply, fill #1
  Filled 2023-04-03: qty 30, 30d supply, fill #2
  Filled 2023-04-29: qty 30, 30d supply, fill #3

## 2022-11-25 MED ORDER — MOUNJARO 5 MG/0.5ML ~~LOC~~ SOAJ
5.0000 mg | SUBCUTANEOUS | 3 refills | Status: DC
  Filled 2022-11-25 (×2): qty 2, 28d supply, fill #0

## 2022-11-28 ENCOUNTER — Other Ambulatory Visit: Payer: Self-pay

## 2022-11-28 ENCOUNTER — Other Ambulatory Visit (HOSPITAL_BASED_OUTPATIENT_CLINIC_OR_DEPARTMENT_OTHER): Payer: Self-pay

## 2022-11-28 DIAGNOSIS — J988 Other specified respiratory disorders: Secondary | ICD-10-CM | POA: Diagnosis not present

## 2022-11-28 DIAGNOSIS — E1165 Type 2 diabetes mellitus with hyperglycemia: Secondary | ICD-10-CM | POA: Diagnosis not present

## 2022-11-28 DIAGNOSIS — E1169 Type 2 diabetes mellitus with other specified complication: Secondary | ICD-10-CM | POA: Diagnosis not present

## 2022-11-28 DIAGNOSIS — Z794 Long term (current) use of insulin: Secondary | ICD-10-CM | POA: Diagnosis not present

## 2022-11-28 DIAGNOSIS — E785 Hyperlipidemia, unspecified: Secondary | ICD-10-CM | POA: Diagnosis not present

## 2022-11-28 DIAGNOSIS — I10 Essential (primary) hypertension: Secondary | ICD-10-CM | POA: Diagnosis not present

## 2022-11-28 MED ORDER — MOUNJARO 7.5 MG/0.5ML ~~LOC~~ SOAJ
7.5000 mg | SUBCUTANEOUS | 1 refills | Status: DC
  Filled 2022-11-28 – 2022-12-02 (×2): qty 2, 28d supply, fill #0
  Filled 2022-12-28: qty 2, 28d supply, fill #1

## 2022-11-29 ENCOUNTER — Ambulatory Visit: Payer: Medicare PPO | Attending: Cardiology

## 2022-11-29 DIAGNOSIS — R600 Localized edema: Secondary | ICD-10-CM

## 2022-12-02 ENCOUNTER — Other Ambulatory Visit: Payer: Self-pay

## 2022-12-02 ENCOUNTER — Other Ambulatory Visit (HOSPITAL_BASED_OUTPATIENT_CLINIC_OR_DEPARTMENT_OTHER): Payer: Self-pay

## 2022-12-02 ENCOUNTER — Other Ambulatory Visit (HOSPITAL_COMMUNITY): Payer: Self-pay

## 2022-12-02 MED ORDER — DAPAGLIFLOZIN PROPANEDIOL 5 MG PO TABS
5.0000 mg | ORAL_TABLET | Freq: Every day | ORAL | 1 refills | Status: DC
  Filled 2022-12-02 – 2022-12-23 (×2): qty 30, 30d supply, fill #0
  Filled 2023-01-22: qty 30, 30d supply, fill #1

## 2022-12-23 ENCOUNTER — Other Ambulatory Visit: Payer: Self-pay

## 2022-12-28 ENCOUNTER — Other Ambulatory Visit (HOSPITAL_COMMUNITY): Payer: Self-pay

## 2023-01-06 ENCOUNTER — Other Ambulatory Visit: Payer: Self-pay | Admitting: Gastroenterology

## 2023-01-12 DIAGNOSIS — R600 Localized edema: Secondary | ICD-10-CM | POA: Diagnosis not present

## 2023-01-12 DIAGNOSIS — Z6841 Body Mass Index (BMI) 40.0 and over, adult: Secondary | ICD-10-CM | POA: Diagnosis not present

## 2023-01-12 DIAGNOSIS — L03116 Cellulitis of left lower limb: Secondary | ICD-10-CM | POA: Diagnosis not present

## 2023-01-13 DIAGNOSIS — R600 Localized edema: Secondary | ICD-10-CM | POA: Diagnosis not present

## 2023-01-13 DIAGNOSIS — M79604 Pain in right leg: Secondary | ICD-10-CM | POA: Diagnosis not present

## 2023-01-13 DIAGNOSIS — M79605 Pain in left leg: Secondary | ICD-10-CM | POA: Diagnosis not present

## 2023-01-20 ENCOUNTER — Other Ambulatory Visit (HOSPITAL_COMMUNITY): Payer: Self-pay

## 2023-01-23 ENCOUNTER — Other Ambulatory Visit: Payer: Self-pay

## 2023-01-24 ENCOUNTER — Other Ambulatory Visit (HOSPITAL_COMMUNITY): Payer: Self-pay

## 2023-01-24 MED ORDER — MOUNJARO 7.5 MG/0.5ML ~~LOC~~ SOAJ
7.5000 mg | SUBCUTANEOUS | 1 refills | Status: DC
  Filled 2023-01-24: qty 2, 28d supply, fill #0
  Filled 2023-02-17: qty 2, 28d supply, fill #1

## 2023-01-25 ENCOUNTER — Other Ambulatory Visit (HOSPITAL_COMMUNITY): Payer: Self-pay

## 2023-01-26 DIAGNOSIS — R252 Cramp and spasm: Secondary | ICD-10-CM | POA: Diagnosis not present

## 2023-01-26 DIAGNOSIS — L03116 Cellulitis of left lower limb: Secondary | ICD-10-CM | POA: Diagnosis not present

## 2023-01-26 DIAGNOSIS — I10 Essential (primary) hypertension: Secondary | ICD-10-CM | POA: Diagnosis not present

## 2023-01-26 DIAGNOSIS — Z13220 Encounter for screening for lipoid disorders: Secondary | ICD-10-CM | POA: Diagnosis not present

## 2023-01-26 DIAGNOSIS — Z6841 Body Mass Index (BMI) 40.0 and over, adult: Secondary | ICD-10-CM | POA: Diagnosis not present

## 2023-01-26 DIAGNOSIS — Z79899 Other long term (current) drug therapy: Secondary | ICD-10-CM | POA: Diagnosis not present

## 2023-01-26 DIAGNOSIS — E1165 Type 2 diabetes mellitus with hyperglycemia: Secondary | ICD-10-CM | POA: Diagnosis not present

## 2023-02-06 ENCOUNTER — Other Ambulatory Visit: Payer: Self-pay | Admitting: *Deleted

## 2023-02-06 DIAGNOSIS — M79605 Pain in left leg: Secondary | ICD-10-CM

## 2023-02-14 ENCOUNTER — Encounter: Payer: Self-pay | Admitting: Infectious Diseases

## 2023-02-14 ENCOUNTER — Other Ambulatory Visit: Payer: Self-pay

## 2023-02-14 ENCOUNTER — Ambulatory Visit: Payer: Medicare PPO | Admitting: Infectious Diseases

## 2023-02-14 ENCOUNTER — Ambulatory Visit
Admission: RE | Admit: 2023-02-14 | Discharge: 2023-02-14 | Disposition: A | Discharge status: Home / Self Care | Payer: Medicare PPO | Source: Ambulatory Visit | Attending: Infectious Diseases | Admitting: Infectious Diseases

## 2023-02-14 VITALS — BP 150/84 | HR 101 | Resp 16 | Ht 60.0 in | Wt 270.0 lb

## 2023-02-14 DIAGNOSIS — R229 Localized swelling, mass and lump, unspecified: Secondary | ICD-10-CM | POA: Diagnosis not present

## 2023-02-14 DIAGNOSIS — M7989 Other specified soft tissue disorders: Secondary | ICD-10-CM | POA: Diagnosis not present

## 2023-02-14 DIAGNOSIS — M4646 Discitis, unspecified, lumbar region: Secondary | ICD-10-CM

## 2023-02-14 DIAGNOSIS — R252 Cramp and spasm: Secondary | ICD-10-CM | POA: Diagnosis not present

## 2023-02-14 NOTE — Progress Notes (Addendum)
Patient Active Problem List   Diagnosis Date Noted   PSVT (paroxysmal supraventricular tachycardia) 09/20/2022   Diarrhea 04/13/2021   Long term (current) use of antibiotics 05/30/2020   Type 2 diabetes mellitus without complications ([place]) [id number]   Hyperlipidemia 05/06/2020   Hypertension 05/06/2020   S/P lumbar fusion 05/06/2020   Morbid obesity ([place]) 05/06/2020   Lumbar discitis 05/05/2020   Spondylolisthesis of lumbar region 02/12/2020    Patient's Medications  New Prescriptions   No medications on file  Previous Medications   ACETAMINOPHEN (TYLENOL) 650 MG CR TABLET    Take 1,300 mg by mouth in the morning and at bedtime.   ALBUTEROL (VENTOLIN HFA) 108 (90 BASE) MCG/ACT INHALER    SMARTSIG:2 Puff(s) Via Inhaler   B COMPLEX VITAMINS CAPSULE    Take 1 capsule by mouth daily.   CELECOXIB (CELEBREX) 200 MG CAPSULE    Take 200 mg by mouth 2 (two) times daily.    CHOLECALCIFEROL (VITAMIN D-3) 25 MCG (1000 UT) CAPS    Take 1,000 Units by mouth in the morning and at bedtime.   CYCLOBENZAPRINE (FLEXERIL) 10 MG TABLET    Take 1 tablet (10 mg total) by mouth 3 (three) times daily as needed for muscle spasms.   DAPAGLIFLOZIN PROPANEDIOL (FARXIGA) 5 MG TABS TABLET    1 tablet Orally Once a day 90 days   DAPAGLIFLOZIN PROPANEDIOL (FARXIGA) 5 MG TABS TABLET    Take 1 tablet (5 mg total) by mouth daily.   DAPAGLIFLOZIN PROPANEDIOL (FARXIGA) 5 MG TABS TABLET    Take 1 tablet (5 mg total) by mouth daily.   DICYCLOMINE (BENTYL) 10 MG CAPSULE    Take 1 capsule (10 mg total) by mouth 3 (three) times daily before meals.   FLUCONAZOLE (DIFLUCAN) 150 MG TABLET    Take by mouth. As needed   GABAPENTIN (NEURONTIN) 600 MG TABLET    Take 600 mg by mouth 3 (three) times daily.   HYOSCYAMINE (LEVSIN SL) 0.125 MG SL TABLET    PLACE 1 TABLET (0.125 MG TOTAL) UNDER THE TONGUE EVERY 8 (EIGHT) HOURS AS NEEDED FOR CRAMPING.   INSULIN DISPOSABLE PUMP (OMNIPOD 5 PACK) MISC       INSULIN DISPOSABLE PUMP  (OMNIPOD DASH 5 PACK PODS) MISC    USE 1 POD EVERY 2 DAYS   INSULIN GLARGINE (LANTUS) 100 UNIT/ML INJECTION    Inject 40 Units into the skin daily.   INSULIN PEN NEEDLE (PEN NEEDLES 31GX5/16") 31G X 8 MM MISC    by Does not apply route.   MAGNESIUM GLUCONATE 250 MG TABS    Take 250 mg by mouth at bedtime.   MULTIPLE VITAMIN (MULTIVITAMIN) CAPSULE    Take 1 capsule by mouth daily.   NOVOLOG 100 UNIT/ML INJECTION    Inject into the skin.   NYSTATIN CREAM (MYCOSTATIN)    Apply 1 application topically See admin instructions. Apply to perineal area once a day   OMEPRAZOLE (PRILOSEC) 20 MG CAPSULE    Take 20 mg by mouth 2 (two) times daily.   ONETOUCH ULTRA TEST STRIP    1 STRIP UP TO 6 TIMES DAILY   PANTOPRAZOLE (PROTONIX) 40 MG TABLET    Take 1 tablet (40 mg total) by mouth daily.   SODIUM CHLORIDE (MURO 128) 5 % OPHTHALMIC OINTMENT    Place 1 application into both eyes at bedtime.   TELMISARTAN-HYDROCHLOROTHIAZIDE (MICARDIS HCT) 80-12.5 MG TABLET    Take 1 tablet by mouth daily.   TIRZEPATIDE [facility]) 5  MG/0.5ML PEN    Inject 5 mg into the skin once a week.   TIRZEPATIDE (MOUNJARO) 5 MG/0.5ML PEN    Inject 5 mg into the skin once a week.   TIRZEPATIDE (MOUNJARO) 5 MG/0.5ML PEN    Inject 5 mg into the skin once a week.   TIRZEPATIDE (MOUNJARO) 7.5 MG/0.5ML PEN    Inject 7.5 mg into the skin every 7 (seven) days.   TRIAMCINOLONE CREAM (KENALOG) 0.1 %    Apply topically 2 (two) times daily as needed.   VERAPAMIL (CALAN) 40 MG TABLET    TAKE 1 TABLET (40 MG TOTAL) BY MOUTH AS NEEDED.  Modified Medications   No medications on file  Discontinued Medications   No medications on file    Subjective: 67 Y O female with prior h/o post surgical lumbar infection complicated with lumbar discitis, osteomyelitis and lumbar fluid collection ( MSSA)  s/p prolonged course of IV cefazolin EOT 06/17/20 after which switched to PO cefadroxil which was eventually stopped Dec 2022 who is referred from PCP for concerns  of leg cramps.   02/14/23 Complains of left leg swelling/pain on and off for a year. Reports she had skin biopsy of left leg 2 years ago and was diagnosed to have psoriasis. She has noticed few nodules in the left anterior leg recently since early Feb. She has completed 10 days course of cephalexin prescribed by PCP 3 weeks ago for pain and swelling in the left leg for concerns for cellulitis. She has also on and off used compression stockings. She has venous US done in Jan 2024 which was negative for DVT in the lower extremities.   Denies fevers, chills and night sweats She has been struggling with nausea and diarrhea since being started on mounjaro Denies GU symptoms or rashes. Used topical steroid cream for psoriasis.  Denies chest pain, cough, difficulty breathing when exerts Denies changes in appetite and weight  She lives with her husband. Denies smoking, alcohol and IVDU.She has 5 dogs and 2 cats. No h/o bites.   Review of Systems: all systems reviewed with pertinent positives and negatives as listed above  Past Medical History:  Diagnosis Date   Chronic back pain    Diabetes mellitus    Dysrhythmia    pat   Edema    lower extremity   Encounter for long-term (current) use of other medications    Family history of adverse reaction to anesthesia    mother slow to wake   Fluid retention    GERD (gastroesophageal reflux disease)    Hyperlipidemia    no meds   Hypertension    IBS (irritable bowel syndrome)    It band syndrome, left    Osteoarthritis of knee    Pneumonia    several   PONV (postoperative nausea and vomiting)    occ   Unspecified vitamin D deficiency    Past Surgical History:  Procedure Laterality Date   CHOLECYSTECTOMY N/A 06/30/2016   Procedure: LAPAROSCOPIC CHOLECYSTECTOMY;  Surgeon: Arta Bruce Kinsinger, MD;  Location: [facility];  Service: General;  Laterality: N/A;   COLONOSCOPY  10/2019   Dr Melina Copa Divertoculosis of the sigmoid colon Otherwise normal  colonoscopy to cecum.   KNEE ARTHROSCOPY Left 2006   knee replacment Right    right knee arthroscopy     TONSILLECTOMY  1963   TOTAL KNEE ARTHROPLASTY Left 06/03/2013   Procedure: LEFT TOTAL KNEE ARTHROPLASTY;  Surgeon: Vickey Huger, MD;  Location: [place];  Service: Orthopedics;  Laterality: Left;   TUBAL LIGATION  1990   UPPER GI ENDOSCOPY  10/26/2011    Social History   Tobacco Use   Smoking status: Never   Smokeless tobacco: Never  Vaping Use   Vaping Use: Never used  Substance Use Topics   Alcohol use: No   Drug use: No    Family History  Problem Relation Age of Onset   Diabetes Mother    Cancer Mother    Hypertension Mother    Hypercholesterolemia Mother    Heart disease Mother    Heart disease Father    Diabetes Father    Hypercholesterolemia Father    Kidney disease Father    Hypertension Father     Allergies  Allergen Reactions   Latex Itching, Rash and Other (See Comments)    NO Band-Aids!!   Egg-Derived Products Nausea And Vomiting   Januvia [Sitagliptin] Palpitations and Rash   Metformin And Related Itching, Palpitations and Rash    Health Maintenance  Topic Date Due   Medicare Annual Wellness (AWV)  Never done   FOOT EXAM  Never done   OPHTHALMOLOGY EXAM  Never done   Diabetic kidney evaluation - Urine ACR  Never done   Hepatitis C Screening  Never done   DTaP/Tdap/Td (1 - Tdap) Never done   MAMMOGRAM  Never done   Zoster Vaccines- Shingrix (1 of 2) Never done   HEMOGLOBIN A1C  11/04/2020   Diabetic kidney evaluation - eGFR measurement  09/29/2021   Pneumonia Vaccine 104+ Years old (1 of 1 - PCV) Never done   DEXA SCAN  Never done   INFLUENZA VACCINE  Never done   COVID-19 Vaccine (4 - 2023-24 season) 07/22/2022   COLONOSCOPY (Pts 45-37yrs Insurance coverage will need to be confirmed)  11/03/2029   HPV VACCINES  Aged Out    Objective: BP (!) 150/84   Pulse (!) 101   Resp 16   Ht 5' (1.524 m)   Wt 270 lb (122.5 kg)   LMP  (LMP Unknown)    SpO2 94%   BMI 52.73 kg/m    Physical Exam Constitutional:      Appearance: Normal appearance. Morbid Obesity  HENT:     Head: Normocephalic and atraumatic.      Mouth: Mucous membranes are moist.  Eyes:    Conjunctiva/sclera: Conjunctivae normal.     Pupils: Pupils are equal, round, and bilaterally symmetrical    Cardiovascular:     Rate and Rhythm: Normal rate and regular rhythm.     Heart sounds: s1s2  Pulmonary:     Effort: Pulmonary effort is normal.     Breath sounds: Normal breath sounds.   Abdominal:     General: Non distended     Palpations: soft.   Musculoskeletal:        General: Normal range of motion.   Skin:    General: Skin is warm and dry.     Comments: 2-3 subcutaneous nodules in the rt anterior leg, mild tenderness, No signs of cellulitis of the overlying skin, No redness, fluctuance. No signs of septic left ankle and left knee.      Neurological:     General: grossly non focal, ambulatory     Mental Status: awake, alert and oriented to person, place, and time.   Psychiatric:        Mood and Affect: Mood normal.   Lab Results Lab Results  Component Value Date   WBC 12.1 (H) 09/29/2020   HGB  12.5 09/29/2020   HCT 36.5 09/29/2020   MCV 82.6 09/29/2020   PLT 274 09/29/2020    Lab Results  Component Value Date   CREATININE 1.01 (H) 09/29/2020   BUN 22 09/29/2020   NA 142 09/29/2020   K 4.8 09/29/2020   CL 107 09/29/2020   CO2 17 (L) 09/29/2020    Lab Results  Component Value Date   ALT 23 05/27/2013   AST 24 05/27/2013   ALKPHOS 66 05/27/2013   BILITOT 0.3 05/27/2013    No results found for: "CHOL", "HDL", "LDLCALC", "LDLDIRECT", "TRIG", "CHOLHDL" No results found for: "LABRPR", "RPRTITER" No results found for: "HIV1RNAQUANT", "HIV1RNAVL", "CD4TABS"   Assessment/Plan  # Scattered subcutaneous nodules in the Left anterior leg - Clinically does not appear to be cellulitis  -  ?Erythema nodosum given location as well mild  tenderness -  Unclear if infective vs non infective. ?cutaneous NTM disease. No known immunocompromising conditions or medications -  ESR and CRP  -  Xray rt leg  -  Vascular US w venous reflux scheduled next week -  Discussed to fu with her Dermatologist who she follows for Psoriasis  - Fu in 2 weeks   #  Mild leukocytosis  - recent CBC and BMP from PCP reviewed, mild elevation of WBC at 12, non significant   I have personally spent 45 minutes involved in face-to-face and non-face-to-face activities for this patient on the day of the visit. Professional time spent includes the following activities: Preparing to see the patient (review of tests), Obtaining and/or reviewing separately obtained history (admission/discharge record), Performing a medically appropriate examination and/or evaluation , Ordering medications/tests/procedures, referring and communicating with other health care professionals, Documenting clinical information in the EMR, Independently interpreting results (not separately reported), Communicating results to the patient/family/caregiver, Counseling and educating the patient/family/caregiver and Care coordination (not separately reported).   Wilber Oliphant, [facility] 02/14/2023, 12:56 PM

## 2023-02-15 DIAGNOSIS — R252 Cramp and spasm: Secondary | ICD-10-CM | POA: Insufficient documentation

## 2023-02-15 DIAGNOSIS — R229 Localized swelling, mass and lump, unspecified: Secondary | ICD-10-CM | POA: Insufficient documentation

## 2023-02-15 DIAGNOSIS — E1169 Type 2 diabetes mellitus with other specified complication: Secondary | ICD-10-CM | POA: Diagnosis not present

## 2023-02-15 LAB — C-REACTIVE PROTEIN: CRP: 45.9 mg/L — ABNORMAL HIGH (ref <8.0)

## 2023-02-15 LAB — SEDIMENTATION RATE: Sed Rate: 36 mm/h — ABNORMAL HIGH (ref 0–30)

## 2023-02-17 ENCOUNTER — Other Ambulatory Visit: Payer: Self-pay

## 2023-02-20 ENCOUNTER — Other Ambulatory Visit (HOSPITAL_COMMUNITY): Payer: Self-pay

## 2023-02-20 ENCOUNTER — Encounter (HOSPITAL_COMMUNITY): Payer: Self-pay

## 2023-02-20 DIAGNOSIS — H2512 Age-related nuclear cataract, left eye: Secondary | ICD-10-CM | POA: Diagnosis not present

## 2023-02-20 DIAGNOSIS — H2511 Age-related nuclear cataract, right eye: Secondary | ICD-10-CM | POA: Diagnosis not present

## 2023-02-20 DIAGNOSIS — Z0100 Encounter for examination of eyes and vision without abnormal findings: Secondary | ICD-10-CM | POA: Diagnosis not present

## 2023-02-20 DIAGNOSIS — E113393 Type 2 diabetes mellitus with moderate nonproliferative diabetic retinopathy without macular edema, bilateral: Secondary | ICD-10-CM | POA: Diagnosis not present

## 2023-02-20 DIAGNOSIS — H2513 Age-related nuclear cataract, bilateral: Secondary | ICD-10-CM | POA: Diagnosis not present

## 2023-02-20 DIAGNOSIS — H18832 Recurrent erosion of cornea, left eye: Secondary | ICD-10-CM | POA: Diagnosis not present

## 2023-02-21 ENCOUNTER — Other Ambulatory Visit (HOSPITAL_COMMUNITY): Payer: Self-pay

## 2023-02-21 NOTE — Progress Notes (Unsigned)
[facility]           [facility] [place]  History and Physical   Alexis Choi is a 67 y.o. female who presents with left leg swelling and pain.  She did have a DVT study of the left leg in January 2024, which revealed no evidence of DVT and no indirect evidence of obstruction proximal to the inguinal ligament.  Pt comes in today for evaluation and here with her husband of 43 years. She was recently seen by infectious disease as she has hx of surgical lumbar infection with lumbar discitis, osteomyelitis and lumbar fluid collection ( MSSA)  s/p prolonged course of IV cefazolin EOT 06/17/20 after which switched to PO cefadroxil which was eventually stopped Dec 2022.  At that visit, she was having leg cramps as well as swelling and pain for a year.  She has had a skin bx of the left leg a couple of years ago, which revealed psoriasis. Her PCP was concerned that she may have cellulitis given new nodules and was referred to ID.  Given she had scattered subcutaneous nodules in the left leg, it was felt she should also follow up with her dermatologist, who follows her for psoriasis.  She did have an xray of the left leg, which was negative for acute osseous abnormality.    Pt has hx of total knee arthroplasty bilaterally.  She states her left leg has been swelling for about a year but worse since January.  She has had a couple of DVT studies that were negative.  She states that she also has these nodules that she is worried about on her lower left leg.  She states she has pain with the swelling.  She does wear compression that is knee high.  She does not have any hx of DVT.  There is no family hx of leg swelling or varicose veins.  She has not had any venous procedures on her legs.  She states that her legs are less swollen in the am when she wakes.  She does elevate her legs with the recliner but not above her heart.    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  The pt  is not on a statin for cholesterol management.  The pt is not on a daily aspirin.   Other AC:  none The pt is on CCB, ARB, diuretic for hypertension.   The pt is is on medication for diabetes.   Tobacco hx:  never  Pt does not have family hx of AAA.  Past Medical History:  Diagnosis Date   Chronic back pain    Diabetes mellitus    Dysrhythmia    pat   Edema    lower extremity   Encounter for long-term (current) use of other medications    Family history of adverse reaction to anesthesia    mother slow to wake   Fluid retention    GERD (gastroesophageal reflux disease)    Hyperlipidemia    no meds   Hypertension    IBS (irritable bowel syndrome)    It band syndrome, left    Osteoarthritis of knee    Pneumonia    several   PONV (postoperative nausea and vomiting)    occ   Unspecified vitamin D deficiency     Past Surgical History:  Procedure Laterality Date   CHOLECYSTECTOMY N/A 06/30/2016   Procedure: LAPAROSCOPIC CHOLECYSTECTOMY;  Surgeon: Mickeal Skinner, MD;  Location: [facility];  Service: General;  Laterality: N/A;   COLONOSCOPY  10/2019   Dr Melina Copa Divertoculosis of the sigmoid colon Otherwise normal colonoscopy to cecum.   KNEE ARTHROSCOPY Left 2006   knee replacment Right    right knee arthroscopy     TONSILLECTOMY  1963   TOTAL KNEE ARTHROPLASTY Left 06/03/2013   Procedure: LEFT TOTAL KNEE ARTHROPLASTY;  Surgeon: Vickey Huger, MD;  Location: [facility];  Service: Orthopedics;  Laterality: Left;   TUBAL LIGATION  1990   UPPER GI ENDOSCOPY  10/26/2011    Social History   Socioeconomic History   Marital status: Married    Spouse name: Not on file   Number of children: Not on file   Years of education: Not on file   Highest education level: Not on file  Occupational History   Not on file  Tobacco Use   Smoking status: Never    Passive exposure: Never   Smokeless tobacco: Never  Vaping Use   Vaping Use: Never used  Substance and Sexual Activity    Alcohol use: No   Drug use: No   Sexual activity: Not on file  Other Topics Concern   Not on file  Social History Narrative   Not on file   Social Determinants of Health   Financial Resource Strain: Not on file  Food Insecurity: Not on file  Transportation Needs: Not on file  Physical Activity: Not on file  Stress: Not on file  Social Connections: Not on file  Intimate Partner Violence: Not on file     Family History  Problem Relation Age of Onset   Diabetes Mother    Cancer Mother    Hypertension Mother    Hypercholesterolemia Mother    Heart disease Mother    Heart disease Father    Diabetes Father    Hypercholesterolemia Father    Kidney disease Father    Hypertension Father     Current Outpatient Medications  Medication Sig Dispense Refill   acetaminophen (TYLENOL) 650 MG CR tablet Take 1,300 mg by mouth in the morning and at bedtime.     albuterol (VENTOLIN HFA) 108 (90 Base) MCG/ACT inhaler SMARTSIG:2 Puff(s) Via Inhaler     b complex vitamins capsule Take 1 capsule by mouth daily.     celecoxib (CELEBREX) 200 MG capsule Take 200 mg by mouth 2 (two) times daily.      Cholecalciferol (VITAMIN D-3) 25 MCG (1000 UT) CAPS Take 1,000 Units by mouth in the morning and at bedtime.     cyclobenzaprine (FLEXERIL) 10 MG tablet Take 1 tablet (10 mg total) by mouth 3 (three) times daily as needed for muscle spasms. (Patient not taking: Reported on 02/14/2023) 50 tablet 1   dapagliflozin propanediol (FARXIGA) 5 MG TABS tablet 1 tablet Orally Once a day 90 days 90 tablet 1   dapagliflozin propanediol (FARXIGA) 5 MG TABS tablet Take 1 tablet (5 mg total) by mouth daily. 30 tablet 3   dapagliflozin propanediol (FARXIGA) 5 MG TABS tablet Take 1 tablet (5 mg total) by mouth daily. 30 tablet 1   dicyclomine (BENTYL) 10 MG capsule Take 1 capsule (10 mg total) by mouth 3 (three) times daily before meals. 90 capsule 4   fluconazole (DIFLUCAN) 150 MG tablet Take by mouth. As needed      gabapentin (NEURONTIN) 300 MG capsule Take 300 mg by mouth 3 (three) times daily.     gabapentin (NEURONTIN) 600 MG tablet Take 600 mg by mouth  3 (three) times daily. (Patient not taking: Reported on 02/14/2023)     hyoscyamine (LEVSIN SL) 0.125 MG SL tablet PLACE 1 TABLET (0.125 MG TOTAL) UNDER THE TONGUE EVERY 8 (EIGHT) HOURS AS NEEDED FOR CRAMPING. 270 tablet 2   Insulin Disposable Pump (OMNIPOD 5 PACK) MISC      Insulin Disposable Pump (OMNIPOD DASH 5 PACK PODS) MISC USE 1 POD EVERY 2 DAYS     insulin glargine (LANTUS) 100 UNIT/ML injection Inject 40 Units into the skin daily.     Insulin Pen Needle (PEN NEEDLES 31GX5/16") 31G X 8 MM MISC by Does not apply route.     levocetirizine (XYZAL) 5 MG tablet Take 5 mg by mouth every evening.     Magnesium Gluconate 250 MG TABS Take 250 mg by mouth at bedtime.     Multiple Vitamin (MULTIVITAMIN) capsule Take 1 capsule by mouth daily.     NOVOLOG 100 UNIT/ML injection Inject into the skin.     nystatin cream (MYCOSTATIN) Apply 1 application topically See admin instructions. Apply to perineal area once a day     omeprazole (PRILOSEC) 20 MG capsule Take 20 mg by mouth 2 (two) times daily. (Patient not taking: Reported on 02/14/2023)     ONETOUCH ULTRA test strip 1 STRIP UP TO 6 TIMES DAILY     pantoprazole (PROTONIX) 40 MG tablet Take 1 tablet (40 mg total) by mouth daily. 90 tablet 4   sodium chloride (MURO 128) 5 % ophthalmic ointment Place 1 application into both eyes at bedtime.     telmisartan-hydrochlorothiazide (MICARDIS HCT) 80-12.5 MG tablet Take 1 tablet by mouth daily.     tirzepatide [facility]) 5 MG/0.5ML Pen Inject 5 mg into the skin once a week. 2 mL 1   tirzepatide (MOUNJARO) 5 MG/0.5ML Pen Inject 5 mg into the skin once a week. (Patient not taking: Reported on 02/14/2023) 2 mL 3   tirzepatide (MOUNJARO) 5 MG/0.5ML Pen Inject 5 mg into the skin once a week. (Patient not taking: Reported on 02/14/2023) 2 mL 3   tirzepatide (MOUNJARO) 7.5  MG/0.5ML Pen Inject 7.5 mg into the skin every 7 (seven) days. 2 mL 1   triamcinolone cream (KENALOG) 0.1 % Apply topically 2 (two) times daily as needed.     verapamil (CALAN) 40 MG tablet TAKE 1 TABLET (40 MG TOTAL) BY MOUTH AS NEEDED. 90 tablet 3   No current facility-administered medications for this visit.    Allergies  Allergen Reactions   Latex Itching, Rash and Other (See Comments)    NO Band-Aids!!   Egg-Derived Products Nausea And Vomiting   Januvia [Sitagliptin] Palpitations and Rash   Metformin And Related Itching, Palpitations and Rash    REVIEW OF SYSTEMS:   [X]  denotes positive finding, [ ]  denotes negative finding Cardiac  Comments:  Chest pain or chest pressure:    Shortness of breath upon exertion:    Short of breath when lying flat:    Irregular heart rhythm:        Vascular    Pain in calf, thigh, or hip brought on by ambulation:    Pain in feet at night that wakes you up from your sleep:     Blood clot in your veins:    Leg swelling:  x       Pulmonary    Oxygen at home:    Productive cough:     Wheezing:         Neurologic    Sudden weakness  in arms or legs:     Sudden numbness in arms or legs:     Sudden onset of difficulty speaking or slurred speech:    Temporary loss of vision in one eye:     Problems with dizziness:         Gastrointestinal    Blood in stool:     Vomited blood:         Genitourinary    Burning when urinating:     Blood in urine:        Psychiatric    Major depression:         Hematologic    Bleeding problems:    Problems with blood clotting too easily:        Skin    Rashes or ulcers:        Constitutional    Fever or chills:      PHYSICAL EXAMINATION:  Today's Vitals   02/22/23 1358  Pulse: (!) 107  Resp: 20  Temp: 98.1 F (36.7 C)  TempSrc: Temporal  SpO2: 95%  Weight: 271 lb 1.6 oz (123 kg)  Height: 5' (1.524 m)  PainSc: 4    Body mass index is 52.95 kg/m.   General:  WDWN in NAD; vital  signs documented above Gait: Not observed HENT: WNL, normocephalic Pulmonary: normal non-labored breathing without wheezing Cardiac: regular HR; without carotid bruits Abdomen: soft, NT, aortic pulse is not palpable Skin: without rashes; subcutaneous nodules left lower leg.  Vascular Exam/Pulses:  Right Left  Radial 2+ (normal) 2+ (normal)  DP 2+ (normal) 2+ (normal)   Extremities:    Neurologic: A&O X 3;  moving all extremities equally Psychiatric:  The pt has Normal affect.   Non-Invasive Vascular Imaging:   Venous duplex on 02/22/2023: +--------------+---------+------+-----------+------------+-------------+  LEFT         Reflux NoRefluxReflux TimeDiameter cmsComments                               Yes                                        +--------------+---------+------+-----------+------------+-------------+  CFV          no                                                   +--------------+---------+------+-----------+------------+-------------+  FV mid        no                                                   +--------------+---------+------+-----------+------------+-------------+  Popliteal    no                                                   +--------------+---------+------+-----------+------------+-------------+  GSV at SFJ              yes    >500 ms  0.73                   +--------------+---------+------+-----------+------------+-------------+  GSV prox thigh          yes    >500 ms      0.73                   +--------------+---------+------+-----------+------------+-------------+  GSV mid thigh no                            0.46                   +--------------+---------+------+-----------+------------+-------------+  GSV dist thighno                            0.47                   +--------------+---------+------+-----------+------------+-------------+  GSV at knee   no                             0.62                   +--------------+---------+------+-----------+------------+-------------+  GSV prox calf           yes    >500 ms      0.41    out of fascia  +--------------+---------+------+-----------+------------+-------------+  GSV mid calf  no                            0.32                   +--------------+---------+------+-----------+------------+-------------+  GSV dist calf no                            0.34                   +--------------+---------+------+-----------+------------+-------------+  SSV Pop Fossa no                            0.25                   +--------------+---------+------+-----------+------------+-------------+  SSV prox calf no                            0.32                   +--------------+---------+------+-----------+------------+-------------+  SSV mid calf  no                            0.27                   +--------------+---------+------+-----------+------------+-------------+  AASV O                  yes    >500 ms      0.58                   +--------------+---------+------+-----------+------------+-------------+  AASV P        no  0.36                   +--------------+---------+------+-----------+------------+-------------+   Summary:  Left:  - No evidence of deep vein thrombosis seen in the left lower extremity, from the common femoral through the popliteal veins.  - No evidence of superficial venous reflux seen in the left short saphenous vein.  - Venous reflux is noted in the left sapheno-femoral junction.  - Venous reflux is noted in the left greater saphenous vein in the thigh.  - Venous reflux is noted in the left greater saphenous vein in the calf.  - Venous reflux is noted in the AASV.     Alexis Choi is a 67 y.o. female who presents with: LLE swelling with subcutaneous nodules    -pt has easily palpable DP pedal  pulses -pt does not have evidence of DVT.  Pt does have venous reflux in the GSV at the SFJ, proximal thigh and in the proximal calf on the left.  She does not have any venous reflux in the deep venous system.   -discussed with pt that most likely subcutaneous nodules not related to venous insufficiency.  -discussed with pt about wearing thigh high 20-30 mmHg compression stockings and pt was measured for these today.    -discussed the importance of leg elevation and how to elevate properly - pt is advised to elevate their legs and a diagram is given to them to demonstrate for pt to lay flat on their back with knees elevated and slightly bent with their feet higher than their knees, which puts their feet higher than their heart for 15 minutes per day.  If pt cannot lay flat, advised to lay as flat as possible.  -pt is advised to continue as much walking as possible and avoid sitting or standing for long periods of time.  -discussed importance of weight loss and exercise and that water aerobics would also be beneficial.  -handout with recommendations given -discussed with pt that the pain in the lower leg could possibly be related to swelling.  She does have some venous insufficiency in the GSV.  I did discuss this with Dr. Donzetta Matters and pt may be candidate for laser ablation.  -pt will f/u in 3 months with MD for further discussions about laser ablation.  -discussed that she should also f/u with her dermatologist for further evaluation for subcutaneous nodules present.   Leontine Locket, [facility] [phone]  Clinic MD:  Donzetta Matters

## 2023-02-22 ENCOUNTER — Ambulatory Visit: Payer: Medicare PPO | Admitting: Physician Assistant

## 2023-02-22 ENCOUNTER — Ambulatory Visit (HOSPITAL_COMMUNITY)
Admission: RE | Admit: 2023-02-22 | Discharge: 2023-02-22 | Disposition: A | Discharge status: Home / Self Care | Payer: Medicare PPO | Source: Ambulatory Visit | Attending: Vascular Surgery | Admitting: Vascular Surgery

## 2023-02-22 VITALS — HR 107 | Temp 98.1°F | Resp 20 | Ht 60.0 in | Wt 271.1 lb

## 2023-02-22 DIAGNOSIS — M79605 Pain in left leg: Secondary | ICD-10-CM | POA: Diagnosis not present

## 2023-02-22 DIAGNOSIS — M7989 Other specified soft tissue disorders: Secondary | ICD-10-CM

## 2023-02-22 DIAGNOSIS — M79604 Pain in right leg: Secondary | ICD-10-CM | POA: Insufficient documentation

## 2023-03-02 DIAGNOSIS — B3731 Acute candidiasis of vulva and vagina: Secondary | ICD-10-CM | POA: Diagnosis not present

## 2023-03-02 DIAGNOSIS — Z79899 Other long term (current) drug therapy: Secondary | ICD-10-CM | POA: Diagnosis not present

## 2023-03-02 DIAGNOSIS — Z780 Asymptomatic menopausal state: Secondary | ICD-10-CM | POA: Diagnosis not present

## 2023-03-02 DIAGNOSIS — R252 Cramp and spasm: Secondary | ICD-10-CM | POA: Diagnosis not present

## 2023-03-02 DIAGNOSIS — E669 Obesity, unspecified: Secondary | ICD-10-CM | POA: Diagnosis not present

## 2023-03-02 DIAGNOSIS — R2242 Localized swelling, mass and lump, left lower limb: Secondary | ICD-10-CM | POA: Diagnosis not present

## 2023-03-02 DIAGNOSIS — E1169 Type 2 diabetes mellitus with other specified complication: Secondary | ICD-10-CM | POA: Diagnosis not present

## 2023-03-02 DIAGNOSIS — D6182 Myelophthisis: Secondary | ICD-10-CM | POA: Diagnosis not present

## 2023-03-02 DIAGNOSIS — Z0000 Encounter for general adult medical examination without abnormal findings: Secondary | ICD-10-CM | POA: Diagnosis not present

## 2023-03-13 DIAGNOSIS — I872 Venous insufficiency (chronic) (peripheral): Secondary | ICD-10-CM | POA: Diagnosis not present

## 2023-03-14 ENCOUNTER — Ambulatory Visit: Payer: Medicare PPO | Admitting: Infectious Diseases

## 2023-03-22 ENCOUNTER — Telehealth: Payer: Self-pay

## 2023-03-22 ENCOUNTER — Other Ambulatory Visit (HOSPITAL_COMMUNITY): Payer: Self-pay

## 2023-03-22 DIAGNOSIS — E1165 Type 2 diabetes mellitus with hyperglycemia: Secondary | ICD-10-CM | POA: Diagnosis not present

## 2023-03-22 DIAGNOSIS — E1169 Type 2 diabetes mellitus with other specified complication: Secondary | ICD-10-CM | POA: Diagnosis not present

## 2023-03-22 DIAGNOSIS — I10 Essential (primary) hypertension: Secondary | ICD-10-CM | POA: Diagnosis not present

## 2023-03-22 DIAGNOSIS — E785 Hyperlipidemia, unspecified: Secondary | ICD-10-CM | POA: Diagnosis not present

## 2023-03-22 MED ORDER — MOUNJARO 10 MG/0.5ML ~~LOC~~ SOAJ
10.0000 mg | SUBCUTANEOUS | 3 refills | Status: DC
  Filled 2023-03-22: qty 2, 28d supply, fill #0
  Filled 2023-04-13: qty 2, 28d supply, fill #1
  Filled 2023-05-15: qty 2, 28d supply, fill #2
  Filled 2023-06-10: qty 2, 28d supply, fill #3

## 2023-03-22 NOTE — Telephone Encounter (Signed)
Pt called regarding custom stocking order. Pt has been advised since sizing is an issue, to wear her knee high hose. Pt verbalized understanding, no further questions/concerns at this time.

## 2023-04-03 ENCOUNTER — Other Ambulatory Visit: Payer: Self-pay

## 2023-04-04 ENCOUNTER — Other Ambulatory Visit: Payer: Self-pay

## 2023-04-12 DIAGNOSIS — H18832 Recurrent erosion of cornea, left eye: Secondary | ICD-10-CM | POA: Diagnosis not present

## 2023-04-14 ENCOUNTER — Other Ambulatory Visit: Payer: Self-pay

## 2023-04-18 DIAGNOSIS — S32009K Unspecified fracture of unspecified lumbar vertebra, subsequent encounter for fracture with nonunion: Secondary | ICD-10-CM | POA: Diagnosis not present

## 2023-04-18 DIAGNOSIS — M4316 Spondylolisthesis, lumbar region: Secondary | ICD-10-CM | POA: Diagnosis not present

## 2023-04-21 DIAGNOSIS — J40 Bronchitis, not specified as acute or chronic: Secondary | ICD-10-CM | POA: Diagnosis not present

## 2023-04-21 DIAGNOSIS — Z6841 Body Mass Index (BMI) 40.0 and over, adult: Secondary | ICD-10-CM | POA: Diagnosis not present

## 2023-04-21 DIAGNOSIS — J329 Chronic sinusitis, unspecified: Secondary | ICD-10-CM | POA: Diagnosis not present

## 2023-04-25 DIAGNOSIS — H04123 Dry eye syndrome of bilateral lacrimal glands: Secondary | ICD-10-CM | POA: Diagnosis not present

## 2023-04-29 ENCOUNTER — Other Ambulatory Visit (HOSPITAL_COMMUNITY): Payer: Self-pay

## 2023-05-01 ENCOUNTER — Other Ambulatory Visit: Payer: Self-pay

## 2023-05-01 ENCOUNTER — Ambulatory Visit: Payer: Medicare PPO | Admitting: Internal Medicine

## 2023-05-15 ENCOUNTER — Other Ambulatory Visit (HOSPITAL_COMMUNITY): Payer: Self-pay

## 2023-05-16 DIAGNOSIS — E1169 Type 2 diabetes mellitus with other specified complication: Secondary | ICD-10-CM | POA: Diagnosis not present

## 2023-05-21 ENCOUNTER — Other Ambulatory Visit: Payer: Self-pay | Admitting: Gastroenterology

## 2023-05-31 ENCOUNTER — Ambulatory Visit: Payer: Medicare PPO | Admitting: Vascular Surgery

## 2023-05-31 ENCOUNTER — Encounter: Payer: Self-pay | Admitting: Vascular Surgery

## 2023-05-31 VITALS — BP 176/69 | HR 95 | Temp 98.0°F | Resp 20 | Ht 60.0 in | Wt 269.0 lb

## 2023-05-31 DIAGNOSIS — M7989 Other specified soft tissue disorders: Secondary | ICD-10-CM

## 2023-05-31 NOTE — Progress Notes (Signed)
Patient ID: Alexis Choi, female   DOB: [birth year], 67 y.o.   MRN: [id number]  Reason for Consult: Follow-up   Referred by Buckner Malta, MD  Subjective:     HPI:  Alexis Choi is a 67 y.o. 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  She has swelling in her leg for over a year left greater than right was evaluated here 3 months ago with lower extremity reflux studies.  She has been wearing knee-high compression stockings she cannot wear thigh-high due to the shape of her legs.  She does not have any ulceration she does have some nodules which have been evaluated by dermatology and they have discussed biopsy.  She is currently switched to [facility] for control of her blood sugar and is hopeful for weight loss.  Past Medical History:  Diagnosis Date   Chronic back pain    Diabetes mellitus    Dysrhythmia    pat   Edema    lower extremity   Encounter for long-term (current) use of other medications    Family history of adverse reaction to anesthesia    mother slow to wake   Fluid retention    GERD (gastroesophageal reflux disease)    Hyperlipidemia    no meds   Hypertension    IBS (irritable bowel syndrome)    It band syndrome, left    Osteoarthritis of knee    Pneumonia    several   PONV (postoperative nausea and vomiting)    occ   Unspecified vitamin D deficiency    Family History  Problem Relation Age of Onset   Diabetes Mother    Cancer Mother    Hypertension Mother    Hypercholesterolemia Mother    Heart disease Mother    Heart disease Father    Diabetes Father    Hypercholesterolemia Father    Kidney disease Father    Hypertension Father    Past Surgical History:  Procedure Laterality Date   CHOLECYSTECTOMY N/A 06/30/2016   Procedure: LAPAROSCOPIC CHOLECYSTECTOMY;  Surgeon: De Blanch Kinsinger, MD;  Location: [facility];  Service: General;  Laterality: N/A;   COLONOSCOPY  10/2019   Dr  Charm Barges Divertoculosis of the sigmoid colon Otherwise normal colonoscopy to cecum.   KNEE ARTHROSCOPY Left 2006   knee replacment Right    right knee arthroscopy     TONSILLECTOMY  1963   TOTAL KNEE ARTHROPLASTY Left 06/03/2013   Procedure: LEFT TOTAL KNEE ARTHROPLASTY;  Surgeon: Dannielle Huh, MD;  Location: [facility];  Service: Orthopedics;  Laterality: Left;   TUBAL LIGATION  1990   UPPER GI ENDOSCOPY  10/26/2011    Short Social History:  Social History   Tobacco Use   Smoking status: Never    Passive exposure: Never   Smokeless tobacco: Never  Substance Use Topics   Alcohol use: No    Allergies  Allergen Reactions   Latex Itching, Rash and Other (See Comments)    NO Band-Aids!!   Egg-Derived Products Nausea And Vomiting   Januvia [Sitagliptin] Palpitations and Rash   Metformin And Related Itching, Palpitations and Rash    Current Outpatient Medications  Medication Sig Dispense Refill   acetaminophen (TYLENOL) 650 MG CR tablet Take 1,300 mg by mouth in the morning and at bedtime.     albuterol (VENTOLIN HFA) 108 (90 Base) MCG/ACT inhaler SMARTSIG:2 Puff(s) Via Inhaler     b complex vitamins capsule Take 1 capsule by mouth  daily.     celecoxib (CELEBREX) 200 MG capsule Take 200 mg by mouth 2 (two) times daily.      Cholecalciferol (VITAMIN D-3) 25 MCG (1000 UT) CAPS Take 1,000 Units by mouth in the morning and at bedtime.     cyclobenzaprine (FLEXERIL) 10 MG tablet Take 1 tablet (10 mg total) by mouth 3 (three) times daily as needed for muscle spasms. 50 tablet 1   dapagliflozin propanediol (FARXIGA) 5 MG TABS tablet 1 tablet Orally Once a day 90 days 90 tablet 1   dapagliflozin propanediol (FARXIGA) 5 MG TABS tablet Take 1 tablet (5 mg total) by mouth daily. 30 tablet 3   dapagliflozin propanediol (FARXIGA) 5 MG TABS tablet Take 1 tablet (5 mg total) by mouth daily. 30 tablet 1   dicyclomine (BENTYL) 10 MG capsule TAKE 1 CAPSULE (10 MG TOTAL) BY MOUTH 3 (THREE) TIMES DAILY  BEFORE MEALS. 270 capsule 1   fluconazole (DIFLUCAN) 150 MG tablet Take by mouth. As needed     gabapentin (NEURONTIN) 300 MG capsule Take 300 mg by mouth 3 (three) times daily.     gabapentin (NEURONTIN) 600 MG tablet Take 600 mg by mouth 3 (three) times daily.     hyoscyamine (LEVSIN SL) 0.125 MG SL tablet PLACE 1 TABLET (0.125 MG TOTAL) UNDER THE TONGUE EVERY 8 (EIGHT) HOURS AS NEEDED FOR CRAMPING. 270 tablet 2   Insulin Disposable Pump (OMNIPOD 5 PACK) MISC      Insulin Disposable Pump (OMNIPOD DASH 5 PACK PODS) MISC USE 1 POD EVERY 2 DAYS     insulin glargine (LANTUS) 100 UNIT/ML injection Inject 40 Units into the skin daily.     Insulin Pen Needle (PEN NEEDLES 31GX5/16") 31G X 8 MM MISC by Does not apply route.     levocetirizine (XYZAL) 5 MG tablet Take 5 mg by mouth every evening.     Magnesium Gluconate 250 MG TABS Take 250 mg by mouth at bedtime.     Multiple Vitamin (MULTIVITAMIN) capsule Take 1 capsule by mouth daily.     NOVOLOG 100 UNIT/ML injection Inject into the skin.     nystatin cream (MYCOSTATIN) Apply 1 application topically See admin instructions. Apply to perineal area once a day     omeprazole (PRILOSEC) 20 MG capsule Take 20 mg by mouth 2 (two) times daily.     ONETOUCH ULTRA test strip 1 STRIP UP TO 6 TIMES DAILY     pantoprazole (PROTONIX) 40 MG tablet Take 1 tablet (40 mg total) by mouth daily. 90 tablet 4   sodium chloride (MURO 128) 5 % ophthalmic ointment Place 1 application into both eyes at bedtime.     telmisartan-hydrochlorothiazide (MICARDIS HCT) 80-12.5 MG tablet Take 1 tablet by mouth daily.     tirzepatide (MOUNJARO) 10 MG/0.5ML Pen Inject 10 mg into the skin every 7 (seven) days. 2 mL 3   triamcinolone cream (KENALOG) 0.1 % Apply topically 2 (two) times daily as needed.     verapamil (CALAN) 40 MG tablet TAKE 1 TABLET (40 MG TOTAL) BY MOUTH AS NEEDED. 90 tablet 3   No current facility-administered medications for this visit.    Review of Systems   Constitutional:  Constitutional negative. HENT: HENT negative.  Eyes: Eyes negative.  Cardiovascular: Positive for leg swelling.  GI: Gastrointestinal negative.  Musculoskeletal: Musculoskeletal negative.  Skin:       Lower extremity nodules Neurological: Neurological negative. Hematologic: Hematologic/lymphatic negative.  Psychiatric: Psychiatric negative.  Objective:  Objective   Vitals:   05/31/23 1437  BP: (!) 176/69  Pulse: 95  Resp: 20  Temp: 98 F (36.7 C)  SpO2: 97%  Weight: 269 lb (122 kg)  Height: 5' (1.524 m)   Body mass index is 52.54 kg/m.  Physical Exam HENT:     Head: Normocephalic.     Nose: Nose normal.  Eyes:     Pupils: Pupils are equal, round, and reactive to light.  Cardiovascular:     Rate and Rhythm: Normal rate.     Pulses: Normal pulses.  Pulmonary:     Effort: Pulmonary effort is normal.  Abdominal:     General: Abdomen is flat.  Musculoskeletal:     Cervical back: Normal range of motion and neck supple.     Right lower leg: Edema present.     Left lower leg: Edema present.  Skin:    General: Skin is warm.     Capillary Refill: Capillary refill takes less than 2 seconds.     Comments: Well-healed total knee arthroplasty incisions bilaterally  Neurological:     General: No focal deficit present.     Mental Status: She is alert.  Psychiatric:        Mood and Affect: Mood normal.     Data: LEFT          Reflux NoRefluxReflux TimeDiameter cmsComments                               Yes                                        +--------------+---------+------+-----------+------------+-------------+  CFV          no                                                   +--------------+---------+------+-----------+------------+-------------+  FV mid        no                                                   +--------------+---------+------+-----------+------------+-------------+  Popliteal    no                                                    +--------------+---------+------+-----------+------------+-------------+  GSV at SFJ              yes    >500 ms      0.73                   +--------------+---------+------+-----------+------------+-------------+  GSV prox thigh          yes    >500 ms      0.73                   +--------------+---------+------+-----------+------------+-------------+  GSV mid thigh no  0.46                   +--------------+---------+------+-----------+------------+-------------+  GSV dist thighno                            0.47                   +--------------+---------+------+-----------+------------+-------------+  GSV at knee   no                            0.62                   +--------------+---------+------+-----------+------------+-------------+  GSV prox calf           yes    >500 ms      0.41    out of fascia  +--------------+---------+------+-----------+------------+-------------+  GSV mid calf  no                            0.32                   +--------------+---------+------+-----------+------------+-------------+  GSV dist calf no                            0.34                   +--------------+---------+------+-----------+------------+-------------+  SSV Pop Fossa no                            0.25                   +--------------+---------+------+-----------+------------+-------------+  SSV prox calf no                            0.32                   +--------------+---------+------+-----------+------------+-------------+  SSV mid calf  no                            0.27                   +--------------+---------+------+-----------+------------+-------------+  AASV O                  yes    >500 ms      0.58                   +--------------+---------+------+-----------+------------+-------------+  AASV P        no                             0.36                   +--------------+---------+------+-----------+------------+-------------+         Summary:  Left:  - No evidence of deep vein thrombosis seen in the left lower extremity,  from the common femoral through the popliteal veins.  - No evidence of superficial venous reflux seen in the left short  saphenous vein.  - Venous reflux is noted in the left sapheno-femoral junction.  - Venous reflux is noted in the  left greater saphenous vein in the thigh.  - Venous reflux is noted in the left greater saphenous vein in the calf.  - Venous reflux is noted in the AASV.         Assessment/Plan:    67 year old female with C3 venous disease on the left also has swelling on the right.  I reviewed her venous reflux study and discussed the results with her.  I discussed that the swelling in her leg is multifactorial and related to her weight, history of total knee arthroplasty as well as venous reflux.  I discussed that we could treat the venous reflux but she would have better results if she has lost weight.  She will continue to wear compression stockings, she can only wear knee-high given the shape of her leg and we have discussed a 10% weight loss which would be 25 pounds.  She will call us when she has lost weight if she is still having issues.  She can otherwise see me on an as-needed basis.     Maeola Harman MD [facility]

## 2023-06-10 ENCOUNTER — Other Ambulatory Visit (HOSPITAL_COMMUNITY): Payer: Self-pay

## 2023-07-17 DIAGNOSIS — E039 Hypothyroidism, unspecified: Secondary | ICD-10-CM | POA: Diagnosis not present

## 2023-07-17 DIAGNOSIS — E669 Obesity, unspecified: Secondary | ICD-10-CM | POA: Diagnosis not present

## 2023-07-17 DIAGNOSIS — E1169 Type 2 diabetes mellitus with other specified complication: Secondary | ICD-10-CM | POA: Diagnosis not present

## 2023-07-17 DIAGNOSIS — R252 Cramp and spasm: Secondary | ICD-10-CM | POA: Diagnosis not present

## 2023-07-25 ENCOUNTER — Other Ambulatory Visit (HOSPITAL_COMMUNITY): Payer: Self-pay

## 2023-07-25 ENCOUNTER — Encounter (HOSPITAL_COMMUNITY): Payer: Self-pay

## 2023-07-25 ENCOUNTER — Encounter (HOSPITAL_COMMUNITY): Payer: Self-pay | Admitting: Pharmacist

## 2023-07-25 DIAGNOSIS — I10 Essential (primary) hypertension: Secondary | ICD-10-CM | POA: Diagnosis not present

## 2023-07-25 DIAGNOSIS — E1165 Type 2 diabetes mellitus with hyperglycemia: Secondary | ICD-10-CM | POA: Diagnosis not present

## 2023-07-25 DIAGNOSIS — E1169 Type 2 diabetes mellitus with other specified complication: Secondary | ICD-10-CM | POA: Diagnosis not present

## 2023-07-25 DIAGNOSIS — E785 Hyperlipidemia, unspecified: Secondary | ICD-10-CM | POA: Diagnosis not present

## 2023-07-25 MED ORDER — MOUNJARO 12.5 MG/0.5ML ~~LOC~~ SOAJ
12.5000 mg | SUBCUTANEOUS | 1 refills | Status: DC
  Filled 2023-07-25 – 2023-07-26 (×2): qty 2, 28d supply, fill #0
  Filled 2023-08-14 – 2023-08-18 (×2): qty 2, 28d supply, fill #1

## 2023-07-26 ENCOUNTER — Other Ambulatory Visit (HOSPITAL_COMMUNITY): Payer: Self-pay

## 2023-07-26 ENCOUNTER — Other Ambulatory Visit: Payer: Self-pay

## 2023-07-28 DIAGNOSIS — Z6841 Body Mass Index (BMI) 40.0 and over, adult: Secondary | ICD-10-CM | POA: Diagnosis not present

## 2023-07-28 DIAGNOSIS — E1169 Type 2 diabetes mellitus with other specified complication: Secondary | ICD-10-CM | POA: Diagnosis not present

## 2023-07-28 DIAGNOSIS — E559 Vitamin D deficiency, unspecified: Secondary | ICD-10-CM | POA: Diagnosis not present

## 2023-07-28 DIAGNOSIS — E669 Obesity, unspecified: Secondary | ICD-10-CM | POA: Diagnosis not present

## 2023-07-28 DIAGNOSIS — I872 Venous insufficiency (chronic) (peripheral): Secondary | ICD-10-CM | POA: Diagnosis not present

## 2023-07-28 DIAGNOSIS — D6182 Myelophthisis: Secondary | ICD-10-CM | POA: Diagnosis not present

## 2023-08-08 ENCOUNTER — Other Ambulatory Visit (HOSPITAL_COMMUNITY): Payer: Self-pay

## 2023-08-14 ENCOUNTER — Other Ambulatory Visit (HOSPITAL_COMMUNITY): Payer: Self-pay

## 2023-08-14 DIAGNOSIS — E1169 Type 2 diabetes mellitus with other specified complication: Secondary | ICD-10-CM | POA: Diagnosis not present

## 2023-08-15 ENCOUNTER — Other Ambulatory Visit: Payer: Self-pay

## 2023-08-17 ENCOUNTER — Other Ambulatory Visit (HOSPITAL_COMMUNITY): Payer: Self-pay

## 2023-08-17 ENCOUNTER — Other Ambulatory Visit: Payer: Self-pay

## 2023-08-17 MED ORDER — DAPAGLIFLOZIN PROPANEDIOL 5 MG PO TABS
5.0000 mg | ORAL_TABLET | Freq: Every day | ORAL | 3 refills | Status: DC
  Filled 2023-08-17: qty 30, 30d supply, fill #0
  Filled 2023-09-25: qty 30, 30d supply, fill #1
  Filled 2023-10-22: qty 30, 30d supply, fill #2
  Filled 2023-11-20: qty 30, 30d supply, fill #3

## 2023-08-18 ENCOUNTER — Other Ambulatory Visit: Payer: Self-pay

## 2023-08-28 ENCOUNTER — Ambulatory Visit: Payer: Medicare PPO | Attending: Internal Medicine | Admitting: Internal Medicine

## 2023-08-28 ENCOUNTER — Encounter: Payer: Self-pay | Admitting: Internal Medicine

## 2023-08-28 VITALS — BP 136/78 | HR 87 | Ht 60.0 in | Wt 273.8 lb

## 2023-08-28 DIAGNOSIS — I471 Supraventricular tachycardia, unspecified: Secondary | ICD-10-CM

## 2023-08-28 MED ORDER — VERAPAMIL HCL ER 120 MG PO TBCR: 120.0000 mg | EXTENDED_RELEASE_TABLET | Freq: Every day | ORAL | Status: DC

## 2023-08-28 NOTE — Patient Instructions (Addendum)
Medication Instructions:  Your physician has recommended you make the following change in your medication:   ** Begin Calan SR 120mg  - 1 tablet by mouth daily. Handwritten Rx given  *If you need a refill on your cardiac medications before your next appointment, please call your pharmacy*   Lab Work: None ordered.  If you have labs (blood work) drawn today and your tests are completely normal, you will receive your results only by: MyChart Message (if you have MyChart) OR A paper copy in the mail If you have any lab test that is abnormal or we need to change your treatment, we will call you to review the results.   Testing/Procedures: None ordered.    Follow-Up: At [facility], you and your health needs are our priority.  As part of our continuing mission to provide you with exceptional heart care, we have created designated Provider Care Teams.  These Care Teams include your primary Cardiologist (physician) and Advanced Practice Providers (APPs -  Physician Assistants and Nurse Practitioners) who all work together to provide you with the care you need, when you need it.  We recommend signing up for the patient portal called "MyChart".  Sign up information is provided on this After Visit Summary.  MyChart is used to connect with patients for Virtual Visits (Telemedicine).  Patients are able to view lab/test results, encounter notes, upcoming appointments, etc.  Non-urgent messages can be sent to your provider as well.   To learn more about what you can do with MyChart, go to [email or web address].    Your next appointment:   12 months with Dr Jimmey Ralph

## 2023-08-28 NOTE — Progress Notes (Unsigned)
Patient Care Team: Buckner Malta, MD as PCP - General (Family Medicine)   HPI  Alexis Choi is a 67 y.o. female seen in follow-up for intermittent tachypalpitations identified as atrial tachycardia for which her long-acting verapamil was changed 12/23 for short acting formulation  Increasingly frequent palliations, so that she is taking verapamil basically daily-- most apparent in the evening  Chronic mod dyspnea, no edema  DATE TEST EF    5/14 Myoview     % No ischemia             Date Cr K Hgb  11/21 1.01 4.8 12.5   6/23 0.8 4.1 12.5    8/24 0.8 4.8 12.3       Records and Results Reviewed   Past Medical History:  Diagnosis Date   Chronic back pain    Diabetes mellitus    Dysrhythmia    pat   Edema    lower extremity   Encounter for long-term (current) use of other medications    Family history of adverse reaction to anesthesia    mother slow to wake   Fluid retention    GERD (gastroesophageal reflux disease)    Hyperlipidemia    no meds   Hypertension    IBS (irritable bowel syndrome)    It band syndrome, left    Osteoarthritis of knee    Pneumonia    several   PONV (postoperative nausea and vomiting)    occ   Unspecified vitamin D deficiency     Past Surgical History:  Procedure Laterality Date   CHOLECYSTECTOMY N/A 06/30/2016   Procedure: LAPAROSCOPIC CHOLECYSTECTOMY;  Surgeon: De Blanch Kinsinger, MD;  Location: [facility];  Service: General;  Laterality: N/A;   COLONOSCOPY  10/2019   Dr Charm Barges Divertoculosis of the sigmoid colon Otherwise normal colonoscopy to cecum.   KNEE ARTHROSCOPY Left 2006   knee replacment Right    right knee arthroscopy     TONSILLECTOMY  1963   TOTAL KNEE ARTHROPLASTY Left 06/03/2013   Procedure: LEFT TOTAL KNEE ARTHROPLASTY;  Surgeon: Dannielle Huh, MD;  Location: [facility];  Service: Orthopedics;  Laterality: Left;   TUBAL LIGATION  1990   UPPER GI ENDOSCOPY  10/26/2011    Current Meds  Medication Sig    acetaminophen (TYLENOL) 650 MG CR tablet Take 1,300 mg by mouth in the morning and at bedtime.   albuterol (VENTOLIN HFA) 108 (90 Base) MCG/ACT inhaler SMARTSIG:2 Puff(s) Via Inhaler   b complex vitamins capsule Take 1 capsule by mouth daily.   celecoxib (CELEBREX) 200 MG capsule Take 200 mg by mouth 2 (two) times daily.    Cholecalciferol (VITAMIN D-3) 25 MCG (1000 UT) CAPS Take 1,000 Units by mouth in the morning and at bedtime.   dapagliflozin propanediol (FARXIGA) 5 MG TABS tablet Take 1 tablet (5 mg total) by mouth daily. (Patient taking differently: Take 2.5 mg by mouth daily.)   dicyclomine (BENTYL) 10 MG capsule TAKE 1 CAPSULE (10 MG TOTAL) BY MOUTH 3 (THREE) TIMES DAILY BEFORE MEALS.   fluconazole (DIFLUCAN) 150 MG tablet Take by mouth. As needed   gabapentin (NEURONTIN) 300 MG capsule Take 300 mg by mouth 3 (three) times daily.   hyoscyamine (LEVSIN SL) 0.125 MG SL tablet PLACE 1 TABLET (0.125 MG TOTAL) UNDER THE TONGUE EVERY 8 (EIGHT) HOURS AS NEEDED FOR CRAMPING.   Insulin Disposable Pump (OMNIPOD 5 PACK) MISC    Insulin Disposable Pump (OMNIPOD DASH 5 PACK PODS)  MISC USE 1 POD EVERY 2 DAYS   insulin glargine (LANTUS) 100 UNIT/ML injection Inject 40 Units into the skin daily.   Insulin Pen Needle (PEN NEEDLES 31GX5/16") 31G X 8 MM MISC by Does not apply route.   levocetirizine (XYZAL) 5 MG tablet Take 5 mg by mouth every evening.   Magnesium Gluconate 250 MG TABS Take 250 mg by mouth at bedtime as needed.   Multiple Vitamin (MULTIVITAMIN) capsule Take 1 capsule by mouth daily.   NOVOLOG 100 UNIT/ML injection Inject into the skin.   nystatin cream (MYCOSTATIN) Apply 1 application topically See admin instructions. Apply to perineal area once a day   ONETOUCH ULTRA test strip 1 STRIP UP TO 6 TIMES DAILY   pantoprazole (PROTONIX) 40 MG tablet Take 1 tablet (40 mg total) by mouth daily.   sodium chloride (MURO 128) 5 % ophthalmic ointment Place 1 application into both eyes at bedtime.    telmisartan-hydrochlorothiazide (MICARDIS HCT) 80-12.5 MG tablet Take 1 tablet by mouth daily.   tirzepatide (MOUNJARO) 12.5 MG/0.5ML Pen Inject 12.5 mg into the skin once a week.   triamcinolone cream (KENALOG) 0.1 % Apply topically 2 (two) times daily as needed.   verapamil (CALAN) 40 MG tablet TAKE 1 TABLET (40 MG TOTAL) BY MOUTH AS NEEDED.    Allergies  Allergen Reactions   Latex Itching, Rash and Other (See Comments)    NO Band-Aids!!   Egg-Derived Products Nausea And Vomiting   Januvia [Sitagliptin] Palpitations and Rash   Metformin And Related Itching, Palpitations and Rash      Review of Systems negative except from HPI and PMH  Physical Exam BP 136/78   Pulse 87   Ht 5' (1.524 m)   Wt 273 lb 12.8 oz (124.2 kg)   LMP  (LMP Unknown)   SpO2 96%   BMI 53.47 kg/m  Well developed and well nourished in no acute distress HENT normal E scleral and icterus clear Neck Supple JVP flat; carotids brisk and full Clear to ausculation Regular rate and rhythm, no murmurs gallops or rub Soft with active bowel sounds No clubbing cyanosis  Edema Alert and oriented, grossly normal motor and sensory function Skin Warm and Dry  ECG sinus at 87 Intervals 13/07/33  CrCl cannot be calculated (Patient's most recent lab result is older than the maximum 21 days allowed.).   Assessment and  Plan Atrial tachycardia    Morbidly obese    Dyspnea on exertion  HFpEF   With increasingly frequent palpitations, will resume daily verapamil, she previously having well tolerated CALAN will prescribe DAW and see how it goes  Encouraged efforts to lose weight, considering water aerobics etcs  BP well controlled on telmisartan -- continue SGLT-2 also      Current medicines are reviewed at length with the patient today .  The patient does not  have concerns regarding medicines.

## 2023-08-30 ENCOUNTER — Encounter: Payer: Self-pay | Admitting: Internal Medicine

## 2023-08-30 MED ORDER — VERAPAMIL HCL ER 120 MG PO CP24: 120.0000 mg | ORAL_CAPSULE | Freq: Every day | ORAL | 3 refills | Status: DC

## 2023-08-30 NOTE — Addendum Note (Signed)
Addended by: Alois Cliche on: 08/30/2023 12:32 PM   Modules accepted: Orders

## 2023-09-05 ENCOUNTER — Other Ambulatory Visit (HOSPITAL_COMMUNITY): Payer: Self-pay

## 2023-09-05 ENCOUNTER — Other Ambulatory Visit: Payer: Self-pay

## 2023-09-05 MED ORDER — MOUNJARO 15 MG/0.5ML ~~LOC~~ SOAJ
15.0000 mg | SUBCUTANEOUS | 5 refills | Status: DC
  Filled 2023-09-05: qty 2, 28d supply, fill #0
  Filled 2023-09-29: qty 2, 28d supply, fill #1
  Filled 2023-10-27: qty 2, 28d supply, fill #2
  Filled 2023-11-20: qty 2, 28d supply, fill #3
  Filled 2023-12-20: qty 2, 28d supply, fill #4
  Filled 2024-01-15: qty 2, 28d supply, fill #5

## 2023-09-25 ENCOUNTER — Other Ambulatory Visit (HOSPITAL_COMMUNITY): Payer: Self-pay

## 2023-09-29 ENCOUNTER — Other Ambulatory Visit (HOSPITAL_COMMUNITY): Payer: Self-pay

## 2023-10-10 DIAGNOSIS — S32009K Unspecified fracture of unspecified lumbar vertebra, subsequent encounter for fracture with nonunion: Secondary | ICD-10-CM | POA: Diagnosis not present

## 2023-10-16 DIAGNOSIS — E1165 Type 2 diabetes mellitus with hyperglycemia: Secondary | ICD-10-CM | POA: Diagnosis not present

## 2023-10-23 ENCOUNTER — Other Ambulatory Visit: Payer: Self-pay

## 2023-10-24 DIAGNOSIS — E785 Hyperlipidemia, unspecified: Secondary | ICD-10-CM | POA: Diagnosis not present

## 2023-10-24 DIAGNOSIS — Z794 Long term (current) use of insulin: Secondary | ICD-10-CM | POA: Diagnosis not present

## 2023-10-24 DIAGNOSIS — I10 Essential (primary) hypertension: Secondary | ICD-10-CM | POA: Diagnosis not present

## 2023-10-24 DIAGNOSIS — E1165 Type 2 diabetes mellitus with hyperglycemia: Secondary | ICD-10-CM | POA: Diagnosis not present

## 2023-10-24 DIAGNOSIS — E1169 Type 2 diabetes mellitus with other specified complication: Secondary | ICD-10-CM | POA: Diagnosis not present

## 2023-10-27 ENCOUNTER — Other Ambulatory Visit: Payer: Self-pay

## 2023-11-03 DIAGNOSIS — J40 Bronchitis, not specified as acute or chronic: Secondary | ICD-10-CM | POA: Diagnosis not present

## 2023-11-03 DIAGNOSIS — J329 Chronic sinusitis, unspecified: Secondary | ICD-10-CM | POA: Diagnosis not present

## 2023-11-17 ENCOUNTER — Other Ambulatory Visit: Payer: Self-pay | Admitting: Gastroenterology

## 2023-11-17 DIAGNOSIS — E1169 Type 2 diabetes mellitus with other specified complication: Secondary | ICD-10-CM | POA: Diagnosis not present

## 2023-11-20 ENCOUNTER — Other Ambulatory Visit: Payer: Self-pay

## 2023-11-23 DIAGNOSIS — R350 Frequency of micturition: Secondary | ICD-10-CM | POA: Diagnosis not present

## 2023-11-23 DIAGNOSIS — E1169 Type 2 diabetes mellitus with other specified complication: Secondary | ICD-10-CM | POA: Diagnosis not present

## 2023-11-23 DIAGNOSIS — R300 Dysuria: Secondary | ICD-10-CM | POA: Diagnosis not present

## 2023-11-23 DIAGNOSIS — J329 Chronic sinusitis, unspecified: Secondary | ICD-10-CM | POA: Diagnosis not present

## 2023-11-23 DIAGNOSIS — D6182 Myelophthisis: Secondary | ICD-10-CM | POA: Diagnosis not present

## 2023-11-23 DIAGNOSIS — Z6841 Body Mass Index (BMI) 40.0 and over, adult: Secondary | ICD-10-CM | POA: Diagnosis not present

## 2023-11-23 DIAGNOSIS — E669 Obesity, unspecified: Secondary | ICD-10-CM | POA: Diagnosis not present

## 2023-11-23 DIAGNOSIS — J40 Bronchitis, not specified as acute or chronic: Secondary | ICD-10-CM | POA: Diagnosis not present

## 2023-11-23 DIAGNOSIS — E785 Hyperlipidemia, unspecified: Secondary | ICD-10-CM | POA: Diagnosis not present

## 2023-11-28 ENCOUNTER — Other Ambulatory Visit: Payer: Self-pay | Admitting: Medical Genetics

## 2023-12-04 ENCOUNTER — Encounter: Payer: Self-pay | Admitting: Internal Medicine

## 2023-12-19 MED ORDER — VERAPAMIL HCL 40 MG PO TABS: 40.0000 mg | ORAL_TABLET | Freq: Two times a day (BID) | ORAL | Status: DC

## 2023-12-20 ENCOUNTER — Other Ambulatory Visit: Payer: Self-pay

## 2023-12-20 MED ORDER — VERAPAMIL HCL 40 MG PO TABS: 40.0000 mg | ORAL_TABLET | Freq: Two times a day (BID) | ORAL | 1 refills | Status: AC

## 2023-12-20 MED ORDER — VERAPAMIL HCL 40 MG PO TABS: 40.0000 mg | ORAL_TABLET | Freq: Two times a day (BID) | ORAL | 1 refills | Status: DC

## 2023-12-20 NOTE — Addendum Note (Signed)
Addended by: Alois Cliche on: 12/20/2023 07:41 AM   Modules accepted: Orders

## 2024-01-02 ENCOUNTER — Other Ambulatory Visit (HOSPITAL_COMMUNITY): Payer: Self-pay

## 2024-01-15 ENCOUNTER — Other Ambulatory Visit (HOSPITAL_COMMUNITY): Payer: Self-pay

## 2024-02-06 ENCOUNTER — Other Ambulatory Visit: Payer: Self-pay | Admitting: Gastroenterology

## 2024-02-12 ENCOUNTER — Other Ambulatory Visit (HOSPITAL_COMMUNITY): Payer: Self-pay

## 2024-02-14 ENCOUNTER — Other Ambulatory Visit (HOSPITAL_COMMUNITY): Payer: Self-pay

## 2024-02-14 ENCOUNTER — Other Ambulatory Visit: Payer: Self-pay

## 2024-02-14 MED ORDER — MOUNJARO 15 MG/0.5ML ~~LOC~~ SOAJ
15.0000 mg | SUBCUTANEOUS | 3 refills | Status: AC
  Filled 2024-02-14: qty 2, 28d supply, fill #0
  Filled 2024-03-08: qty 2, 28d supply, fill #1
  Filled 2024-04-05: qty 2, 28d supply, fill #2
  Filled 2024-04-22: qty 2, 28d supply, fill #3

## 2024-02-15 ENCOUNTER — Other Ambulatory Visit (HOSPITAL_COMMUNITY): Payer: Self-pay

## 2024-02-15 MED ORDER — MOUNJARO 15 MG/0.5ML ~~LOC~~ SOAJ
15.0000 mg | SUBCUTANEOUS | 5 refills | Status: AC
Start: 2024-02-15 — End: ?
  Filled 2024-02-15 – 2024-05-19 (×2): qty 2, 28d supply, fill #0

## 2024-02-21 DIAGNOSIS — E119 Type 2 diabetes mellitus without complications: Secondary | ICD-10-CM | POA: Diagnosis not present

## 2024-02-21 DIAGNOSIS — H18832 Recurrent erosion of cornea, left eye: Secondary | ICD-10-CM | POA: Diagnosis not present

## 2024-02-21 DIAGNOSIS — H2513 Age-related nuclear cataract, bilateral: Secondary | ICD-10-CM | POA: Diagnosis not present

## 2024-02-21 DIAGNOSIS — H02883 Meibomian gland dysfunction of right eye, unspecified eyelid: Secondary | ICD-10-CM | POA: Diagnosis not present

## 2024-02-21 DIAGNOSIS — Z0100 Encounter for examination of eyes and vision without abnormal findings: Secondary | ICD-10-CM | POA: Diagnosis not present

## 2024-02-23 DIAGNOSIS — E1169 Type 2 diabetes mellitus with other specified complication: Secondary | ICD-10-CM | POA: Diagnosis not present

## 2024-02-28 DIAGNOSIS — E1165 Type 2 diabetes mellitus with hyperglycemia: Secondary | ICD-10-CM | POA: Diagnosis not present

## 2024-02-28 DIAGNOSIS — E7800 Pure hypercholesterolemia, unspecified: Secondary | ICD-10-CM | POA: Diagnosis not present

## 2024-02-28 LAB — LAB REPORT - SCANNED
A1c: 8.6
Albumin, Urine POC: 4
Creatinine, POC: 69.4 mg/dL

## 2024-03-04 ENCOUNTER — Ambulatory Visit: Payer: Medicare PPO | Admitting: Gastroenterology

## 2024-03-04 ENCOUNTER — Encounter: Payer: Self-pay | Admitting: Gastroenterology

## 2024-03-04 VITALS — BP 128/70 | HR 98 | Ht 60.0 in | Wt 273.6 lb

## 2024-03-04 DIAGNOSIS — R195 Other fecal abnormalities: Secondary | ICD-10-CM

## 2024-03-04 DIAGNOSIS — K9089 Other intestinal malabsorption: Secondary | ICD-10-CM

## 2024-03-04 DIAGNOSIS — R1013 Epigastric pain: Secondary | ICD-10-CM | POA: Diagnosis not present

## 2024-03-04 DIAGNOSIS — Z800 Family history of malignant neoplasm of digestive organs: Secondary | ICD-10-CM

## 2024-03-04 DIAGNOSIS — K580 Irritable bowel syndrome with diarrhea: Secondary | ICD-10-CM

## 2024-03-04 MED ORDER — PANTOPRAZOLE SODIUM 40 MG PO TBEC: 40.0000 mg | DELAYED_RELEASE_TABLET | Freq: Two times a day (BID) | ORAL | 3 refills | Status: AC

## 2024-03-04 MED ORDER — CHOLESTYRAMINE 4 G PO PACK
4.0000 g | PACK | Freq: Every day | ORAL | 0 refills | Status: DC
Start: 2024-03-04 — End: 2024-05-31

## 2024-03-04 NOTE — Progress Notes (Signed)
 Chief Complaint: FU  Referring Provider:  Buckner Malta, MD      ASSESSMENT AND PLAN;   #1. Epi pain- Nl CBC, CMP, lipase. S/P chole in past  #2. IBS-D with element of postcholecystectomy diarrhea. Also exacerbated by meds  #3. H+ stools. Neg colon 10/2019. Nl CBC   #4. FH CRC mom at age 68. Neg colon 2020   Plan: -Increase Protonix 40mg  po BID #60, 11RF -Continue Bentyl 10 mg po BID  -Cholestyamine 4g po every day #30, 6RF -Stop hyoscamine. -EGD/Colon at [facility] (BMI>50)- July 2025, off Mounjaro x 1 week. -Blood test results from [facility] -If still with problems and above WU is neg, would proceed with CT scan abdo/pelvis.     I discussed EGD/Colonoscopy- the indications, risks, alternatives and potential complications including, but not limited to bleeding, infection, reaction to meds, damage to internal organs, cardiac and/or pulmonary problems, and perforation requiring surgery. The possibility that significant findings could be missed was explained. All ? were answered. Pt consents to proceed.  HPI:    Alexis Choi is a 68 y.o. female  Charity fundraiser (retd nursing professor) With multiple medical problems as below including IDDM, obesity (BMI>50), s/p cholecystectomy  Discussed the use of AI scribe software for clinical note transcription with the patient, who gave verbal consent to proceed.  History of Present Illness Alexis Choi is a 68 year old female who presents with diarrhea and abdominal pain, possibly related to [facility] use.  She experiences diarrhea and abdominal pain, which she attributes to her use of Mounjaro. The diarrhea occurs at least every other day, often with episodes of incontinence, typically beginning three to four days after taking Mounjaro, preceded by initial constipation. She also experiences nausea and bloating. No foul-smelling diarrhea is present, and it no longer disrupts her sleep.  She has a history of gallbladder removal,  which has led to episodes of bowel dumping. In the past, she was prescribed cholestyramine powder to manage these symptoms. She mentions taking antibiotics before dental procedures and having been sick in January.  She has concerns about potential blood loss, although her hemoglobin is currently normal. She has not taken aspirin recently and is unsure about her current hemoglobin levels due to issues accessing her lab results.  She has been on [facility] for about a year without experiencing weight loss, and her weight has remained stable. She mentions stress related to her husband's recent diagnosis of early signs of Alzheimer's disease, which has been challenging for her.  Her current medications include Bentyl, taken twice a day for stomach pain, and Protonix, which she plans to increase to twice a day due to stomach irritation from Celebrex. She also takes an antihistamine at night. She previously used hyoscyamine but stopped due to insurance issues and lack of efficacy.     From previous notes: With H+ stools, Nl CBC with Hb 12.4.  Iron Studies have improved.  Had several colonoscopies in the past by Dr. Charm Barges d/t FH CRC. Last colon 2020- neg except for sigmoid diverticulosis.  Good prep. Rpt 5 yrs per Dr. Charm Barges.  Longstanding IBS-D x yrs. Bms 2-3/day (at baseline). No Noct symptoms.  Cholecystectomy did make diarrhea more prominent.  No melena or hematochezia.  She has been off antibiotics since December 2022 (after lumbar fusion).  Negative stool studies in the past.  Much better with combination of Bentyl/hyoscyamine. Note that she has to take magnesium.  Wt Readings from Last 3 Encounters:  03/04/24 273 lb 9.6  oz (124.1 kg)  08/28/23 273 lb 12.8 oz (124.2 kg)  05/31/23 269 lb (122 kg)      Past GI procedures: Colonoscopy by Dr. Randal Bury 10/2019-good prep, moderate sigmoid diverticulosis.  Otherwise normal.  Repeat in 5 years due to family history.  EGD over 15 years ago by Dr.  Randal Bury which was negative.  SH- husband with early onset dementia  Past Medical History:  Diagnosis Date   Chronic back pain    Diabetes mellitus    Dysrhythmia    pat   Edema    lower extremity   Encounter for long-term (current) use of other medications    Family history of adverse reaction to anesthesia    mother slow to wake   Fluid retention    GERD (gastroesophageal reflux disease)    Hyperlipidemia    no meds   Hypertension    IBS (irritable bowel syndrome)    It band syndrome, left    Osteoarthritis of knee    Pneumonia    several   PONV (postoperative nausea and vomiting)    occ   Unspecified vitamin D deficiency     Past Surgical History:  Procedure Laterality Date   CHOLECYSTECTOMY N/A 06/30/2016   Procedure: LAPAROSCOPIC CHOLECYSTECTOMY;  Surgeon: Alphonso Aschoff Kinsinger, MD;  Location: [facility];  Service: General;  Laterality: N/A;   COLONOSCOPY  10/2019   Dr Randal Bury Divertoculosis of the sigmoid colon Otherwise normal colonoscopy to cecum.   KNEE ARTHROSCOPY Left 2006   knee replacment Right    right knee arthroscopy     TONSILLECTOMY  1963   TOTAL KNEE ARTHROPLASTY Left 06/03/2013   Procedure: LEFT TOTAL KNEE ARTHROPLASTY;  Surgeon: Christie Cox, MD;  Location: [facility] OR;  Service: Orthopedics;  Laterality: Left;   TUBAL LIGATION  1990   UPPER GI ENDOSCOPY  10/26/2011    Family History  Problem Relation Age of Onset   Diabetes Mother    Cancer Mother    Hypertension Mother    Hypercholesterolemia Mother    Heart disease Mother    Heart disease Father    Diabetes Father    Hypercholesterolemia Father    Kidney disease Father    Hypertension Father     Social History   Tobacco Use   Smoking status: Never    Passive exposure: Never   Smokeless tobacco: Never  Vaping Use   Vaping status: Never Used  Substance Use Topics   Alcohol use: No   Drug use: No    Current Outpatient Medications  Medication Sig Dispense Refill   acetaminophen (TYLENOL)  650 MG CR tablet Take 1,300 mg by mouth in the morning and at bedtime.     albuterol (VENTOLIN HFA) 108 (90 Base) MCG/ACT inhaler SMARTSIG:2 Puff(s) Via Inhaler     b complex vitamins capsule Take 1 capsule by mouth daily.     celecoxib (CELEBREX) 200 MG capsule Take 200 mg by mouth 2 (two) times daily.      dapagliflozin propanediol (FARXIGA) 5 MG TABS tablet Take 1 tablet (5 mg total) by mouth daily. (Patient taking differently: Take 2.5 mg by mouth daily.) 30 tablet 3   dicyclomine (BENTYL) 10 MG capsule TAKE 1 CAPSULE (10 MG TOTAL) BY MOUTH 3 (THREE) TIMES DAILY BEFORE MEALS. (Patient taking differently: Take 10 mg by mouth 2 (two) times daily.) 270 capsule 1   fluconazole (DIFLUCAN) 150 MG tablet Take by mouth. As needed     gabapentin (NEURONTIN) 300 MG capsule Take 300 mg  by mouth 3 (three) times daily.     Insulin Disposable Pump (OMNIPOD 5 PACK) MISC      Insulin Disposable Pump (OMNIPOD DASH 5 PACK PODS) MISC USE 1 POD EVERY 2 DAYS     insulin glargine (LANTUS) 100 UNIT/ML injection Inject 40 Units into the skin daily.     Insulin Pen Needle (PEN NEEDLES 31GX5/16") 31G X 8 MM MISC by Does not apply route.     levocetirizine (XYZAL) 5 MG tablet Take 5 mg by mouth every evening.     Magnesium Gluconate 250 MG TABS Take 250 mg by mouth at bedtime as needed.     Multiple Vitamin (MULTIVITAMIN) capsule Take 1 capsule by mouth daily.     NOVOLOG 100 UNIT/ML injection Inject into the skin.     nystatin cream (MYCOSTATIN) Apply 1 application topically See admin instructions. Apply to perineal area once a day     ONETOUCH ULTRA test strip 1 STRIP UP TO 6 TIMES DAILY     pantoprazole (PROTONIX) 40 MG tablet TAKE 1 TABLET BY MOUTH EVERY DAY 90 tablet 1   sodium chloride (MURO 128) 5 % ophthalmic ointment Place 1 application into both eyes at bedtime.     telmisartan-hydrochlorothiazide (MICARDIS HCT) 80-12.5 MG tablet Take 1 tablet by mouth daily.     tirzepatide (MOUNJARO) 15 MG/0.5ML Pen Inject  15 mg into the skin every 7 (seven) days. 2 mL 3   tirzepatide (MOUNJARO) 15 MG/0.5ML Pen Inject 15 mg into the skin every 7 (seven) days. 2 mL 5   triamcinolone cream (KENALOG) 0.1 % Apply topically 2 (two) times daily as needed.     verapamil (CALAN) 40 MG tablet Take 1 tablet (40 mg total) by mouth 2 (two) times daily. 180 tablet 1   hyoscyamine (LEVSIN SL) 0.125 MG SL tablet PLACE 1 TABLET (0.125 MG TOTAL) UNDER THE TONGUE EVERY 8 (EIGHT) HOURS AS NEEDED FOR CRAMPING. (Patient not taking: Reported on 03/04/2024) 270 tablet 2   No current facility-administered medications for this visit.    Allergies  Allergen Reactions   Latex Itching, Rash and Other (See Comments)    NO Band-Aids!!   Egg-Derived Products Nausea And Vomiting   Januvia [Sitagliptin] Palpitations and Rash   Metformin And Related Itching, Palpitations and Rash    Review of Systems:  neg     Physical Exam:    BP 128/70   Pulse 98   Ht 5' (1.524 m)   Wt 273 lb 9.6 oz (124.1 kg)   LMP  (LMP Unknown)   SpO2 96%   BMI 53.43 kg/m  Wt Readings from Last 3 Encounters:  03/04/24 273 lb 9.6 oz (124.1 kg)  08/28/23 273 lb 12.8 oz (124.2 kg)  05/31/23 269 lb (122 kg)   Constitutional:  Well-developed, in no acute distress. Psychiatric: Normal mood and affect. Behavior is normal. HEENT: Pupils normal.  Conjunctivae are normal. No scleral icterus.  Cardiovascular: Normal rate, regular rhythm. No edema Pulmonary/chest: Effort normal and breath sounds normal. No wheezing, rales or rhonchi. Abdominal: Soft, nondistended.  Mild epi tenderness. Bowel sounds active throughout. There are no masses palpable. No hepatomegaly. Rectal: Deferred Neurological: Alert and oriented to person place and time. Skin: Skin is warm and dry. No rashes noted.  Data Reviewed: I have personally reviewed following labs and imaging studies From 04/25/2022: -Normal CBC with hemoglobin 12.4, MCV 84, platelets 282, hemoglobin A1c 7.7 -Nl B12,  TSH, CMP including LFTs with AST 21, ALT 20 -Iron studies  were normal except for saturation of 14%     Latest Ref Rng & Units 09/29/2020    3:00 PM 07/28/2020    1:56 PM 05/05/2020    9:56 PM  CBC  WBC 3.8 - 10.8 Thousand/uL 12.1  12.9  16.0   Hemoglobin 11.7 - 15.5 g/dL 16.1  09.6  9.0   Hematocrit 35.0 - 45.0 % 36.5  34.6  29.7   Platelets 140 - 400 Thousand/uL 274  293  491        Magnus Schuller, MD 03/04/2024, 3:17 PM  Cc: Harvest Lineman, MD [phone]

## 2024-03-04 NOTE — Patient Instructions (Addendum)
 _______________________________________________________  If your blood pressure at your visit was 140/90 or greater, please contact your primary care physician to follow up on this.  _______________________________________________________  If you are age 68 or older, your body mass index should be between 23-30. Your Body mass index is 53.43 kg/m. If this is out of the aforementioned range listed, please consider follow up with your Primary Care Provider.  If you are age 70 or younger, your body mass index should be between 19-25. Your Body mass index is 53.43 kg/m. If this is out of the aformentioned range listed, please consider follow up with your Primary Care Provider.   ________________________________________________________  The  GI providers would like to encourage you to use MYCHART to communicate with providers for non-urgent requests or questions.  Due to long hold times on the telephone, sending your provider a message by [facility] may be a faster and more efficient way to get a response.  Please allow 48 business hours for a response.  Please remember that this is for non-urgent requests.  _______________________________________________________  We have sent the following medications to your pharmacy for you to pick up at your convenience: Protonix 40mg  2 times a day  Questran 1 packet daily take 2 hours before or after all other medictions  You have been scheduled for an endoscopy and colonoscopy. Case number [id number] Please follow the written instructions given to you at your visit today.  If you use inhalers (even only as needed), please bring them with you on the day of your procedure.  DO NOT TAKE 7 DAYS PRIOR TO TEST- Trulicity (dulaglutide) Ozempic, Wegovy (semaglutide) Mounjaro (tirzepatide) Bydureon Bcise (exanatide extended release)  DO NOT TAKE 1 DAY PRIOR TO YOUR TEST Rybelsus (semaglutide) Adlyxin (lixisenatide) Victoza (liraglutide) Byetta  (exanatide) ___________________________________________________________________________  Thank you,  Dr. Lajuan Pila

## 2024-03-06 ENCOUNTER — Other Ambulatory Visit (HOSPITAL_COMMUNITY): Payer: Self-pay

## 2024-03-06 DIAGNOSIS — Z794 Long term (current) use of insulin: Secondary | ICD-10-CM | POA: Diagnosis not present

## 2024-03-06 DIAGNOSIS — E1169 Type 2 diabetes mellitus with other specified complication: Secondary | ICD-10-CM | POA: Diagnosis not present

## 2024-03-06 DIAGNOSIS — E1165 Type 2 diabetes mellitus with hyperglycemia: Secondary | ICD-10-CM | POA: Diagnosis not present

## 2024-03-06 DIAGNOSIS — I10 Essential (primary) hypertension: Secondary | ICD-10-CM | POA: Diagnosis not present

## 2024-03-06 DIAGNOSIS — E785 Hyperlipidemia, unspecified: Secondary | ICD-10-CM | POA: Diagnosis not present

## 2024-03-06 MED ORDER — OMNIPOD 5 DEXG7G6 PODS GEN 5 MISC
1.0000 | 5 refills | Status: AC
Start: 2024-03-06 — End: ?
  Filled 2024-03-06: qty 20, 30d supply, fill #0
  Filled 2024-04-01: qty 20, 30d supply, fill #1
  Filled 2024-04-22: qty 20, 30d supply, fill #2
  Filled 2024-05-19: qty 20, 30d supply, fill #3
  Filled 2024-08-14 – 2024-08-15 (×2): qty 20, 30d supply, fill #4

## 2024-03-07 ENCOUNTER — Other Ambulatory Visit: Payer: Self-pay

## 2024-03-07 ENCOUNTER — Other Ambulatory Visit (HOSPITAL_COMMUNITY): Payer: Self-pay

## 2024-03-08 ENCOUNTER — Other Ambulatory Visit (HOSPITAL_COMMUNITY): Payer: Self-pay

## 2024-03-14 DIAGNOSIS — E1169 Type 2 diabetes mellitus with other specified complication: Secondary | ICD-10-CM | POA: Diagnosis not present

## 2024-03-14 DIAGNOSIS — R400 Somnolence: Secondary | ICD-10-CM | POA: Diagnosis not present

## 2024-03-14 DIAGNOSIS — Z0000 Encounter for general adult medical examination without abnormal findings: Secondary | ICD-10-CM | POA: Diagnosis not present

## 2024-03-14 DIAGNOSIS — M7632 Iliotibial band syndrome, left leg: Secondary | ICD-10-CM | POA: Diagnosis not present

## 2024-03-14 DIAGNOSIS — R0982 Postnasal drip: Secondary | ICD-10-CM | POA: Diagnosis not present

## 2024-03-14 DIAGNOSIS — R252 Cramp and spasm: Secondary | ICD-10-CM | POA: Diagnosis not present

## 2024-03-14 DIAGNOSIS — E669 Obesity, unspecified: Secondary | ICD-10-CM | POA: Diagnosis not present

## 2024-03-14 DIAGNOSIS — I890 Lymphedema, not elsewhere classified: Secondary | ICD-10-CM | POA: Diagnosis not present

## 2024-03-14 DIAGNOSIS — J309 Allergic rhinitis, unspecified: Secondary | ICD-10-CM | POA: Diagnosis not present

## 2024-03-15 ENCOUNTER — Telehealth: Payer: Self-pay

## 2024-03-15 NOTE — Telephone Encounter (Signed)
 To whom it may concern:  Re: Alexis Choi  DOB 1955-12-13  Shaquasha is wearing an Omnipod pump connected to a Dexcom G6 sensor. This pump is running in automation mode and it is recommended she keep her insulin  pump in place for her prep, during procedure and after. She has been instructed to increase her target blood sugar from 110 up to 150 while she is NPO and during procedure. She can lower target blood sugar back to 110 after procedure.   Also advised HOLD Mounjaro  1 week prior to procedure  Sincerely  Alma Jackson FNP-C   Patient made aware and mychart message. Sent a mychart as well

## 2024-03-22 DIAGNOSIS — G4733 Obstructive sleep apnea (adult) (pediatric): Secondary | ICD-10-CM | POA: Diagnosis not present

## 2024-03-22 DIAGNOSIS — R0602 Shortness of breath: Secondary | ICD-10-CM | POA: Diagnosis not present

## 2024-03-25 DIAGNOSIS — R0602 Shortness of breath: Secondary | ICD-10-CM | POA: Diagnosis not present

## 2024-03-25 DIAGNOSIS — G4733 Obstructive sleep apnea (adult) (pediatric): Secondary | ICD-10-CM | POA: Diagnosis not present

## 2024-04-01 ENCOUNTER — Telehealth: Admitting: Physician Assistant

## 2024-04-01 ENCOUNTER — Other Ambulatory Visit (HOSPITAL_COMMUNITY): Payer: Self-pay

## 2024-04-01 DIAGNOSIS — J208 Acute bronchitis due to other specified organisms: Secondary | ICD-10-CM

## 2024-04-01 DIAGNOSIS — B9689 Other specified bacterial agents as the cause of diseases classified elsewhere: Secondary | ICD-10-CM | POA: Diagnosis not present

## 2024-04-01 MED ORDER — CEFDINIR 300 MG PO CAPS: 300.0000 mg | ORAL_CAPSULE | Freq: Two times a day (BID) | ORAL | 0 refills | Status: AC

## 2024-04-01 NOTE — Progress Notes (Signed)
 E-Visit for Cough   We are sorry that you are not feeling well.  Here is how we plan to help!  Based on your presentation I believe you most likely have  A cough due to bacteria.  When patients have a fever and a productive cough with a change in color or increased sputum production, we are concerned about bacterial bronchitis.  If left untreated it can progress to pneumonia.  If your symptoms do not improve with your treatment plan it is important that you contact your provider.   I have prescribed Cefdinir 300mg  Take 1 capsule twice daily for 10 days.   In addition you may use A non-prescription cough medication called Mucinex DM: take 2 tablets every 12 hours.  From your responses in the eVisit questionnaire you describe inflammation in the upper respiratory tract which is causing a significant cough.  This is commonly called Bronchitis and has four common causes:   Allergies Viral Infections Acid Reflux Bacterial Infection Allergies, viruses and acid reflux are treated by controlling symptoms or eliminating the cause. An example might be a cough caused by taking certain blood pressure medications. You stop the cough by changing the medication. Another example might be a cough caused by acid reflux. Controlling the reflux helps control the cough.  USE OF BRONCHODILATOR ("RESCUE") INHALERS: There is a risk from using your bronchodilator too frequently.  The risk is that over-reliance on a medication which only relaxes the muscles surrounding the breathing tubes can reduce the effectiveness of medications prescribed to reduce swelling and congestion of the tubes themselves.  Although you feel brief relief from the bronchodilator inhaler, your asthma may actually be worsening with the tubes becoming more swollen and filled with mucus.  This can delay other crucial treatments, such as oral steroid medications. If you need to use a bronchodilator inhaler daily, several times per day, you should  discuss this with your provider.  There are probably better treatments that could be used to keep your asthma under control.     HOME CARE Only take medications as instructed by your medical team. Complete the entire course of an antibiotic. Drink plenty of fluids and get plenty of rest. Avoid close contacts especially the very young and the elderly Cover your mouth if you cough or cough into your sleeve. Always remember to wash your hands A steam or ultrasonic humidifier can help congestion.   GET HELP RIGHT AWAY IF: You develop worsening fever. You become short of breath You cough up blood. Your symptoms persist after you have completed your treatment plan MAKE SURE YOU  Understand these instructions. Will watch your condition. Will get help right away if you are not doing well or get worse.    Thank you for choosing an e-visit.  Your e-visit answers were reviewed by a board certified advanced clinical practitioner to complete your personal care plan. Depending upon the condition, your plan could have included both over the counter or prescription medications.  Please review your pharmacy choice. Make sure the pharmacy is open so you can pick up prescription now. If there is a problem, you may contact your provider through [employer] and have the prescription routed to another pharmacy.  Your safety is important to us . If you have drug allergies check your prescription carefully.   For the next 24 hours you can use MyChart to ask questions about today's visit, request a non-urgent call back, or ask for a work or school excuse. You will get  an email in the next two days asking about your experience. I hope that your e-visit has been valuable and will speed your recovery.   I have spent 5 minutes in review of e-visit questionnaire, review and updating patient chart, medical decision making and response to patient.   Angelia Kelp, PA-C

## 2024-04-08 ENCOUNTER — Other Ambulatory Visit (HOSPITAL_COMMUNITY): Payer: Self-pay

## 2024-04-22 ENCOUNTER — Other Ambulatory Visit (HOSPITAL_COMMUNITY): Payer: Self-pay

## 2024-04-22 ENCOUNTER — Other Ambulatory Visit: Payer: Self-pay

## 2024-05-10 ENCOUNTER — Other Ambulatory Visit: Payer: Self-pay | Admitting: Gastroenterology

## 2024-05-20 ENCOUNTER — Other Ambulatory Visit: Payer: Self-pay

## 2024-05-20 ENCOUNTER — Other Ambulatory Visit (HOSPITAL_COMMUNITY): Payer: Self-pay

## 2024-05-23 ENCOUNTER — Telehealth: Admitting: Physician Assistant

## 2024-05-23 DIAGNOSIS — E1169 Type 2 diabetes mellitus with other specified complication: Secondary | ICD-10-CM | POA: Diagnosis not present

## 2024-05-23 DIAGNOSIS — B9689 Other specified bacterial agents as the cause of diseases classified elsewhere: Secondary | ICD-10-CM | POA: Diagnosis not present

## 2024-05-23 DIAGNOSIS — J019 Acute sinusitis, unspecified: Secondary | ICD-10-CM | POA: Diagnosis not present

## 2024-05-23 MED ORDER — DOXYCYCLINE HYCLATE 100 MG PO TABS: 100.0000 mg | ORAL_TABLET | Freq: Two times a day (BID) | ORAL | 0 refills | Status: AC

## 2024-05-23 NOTE — Progress Notes (Signed)

## 2024-05-31 ENCOUNTER — Other Ambulatory Visit: Payer: Self-pay | Admitting: Gastroenterology

## 2024-06-10 ENCOUNTER — Encounter (HOSPITAL_COMMUNITY): Payer: Self-pay | Admitting: Gastroenterology

## 2024-06-11 ENCOUNTER — Telehealth: Payer: Self-pay

## 2024-06-11 NOTE — Telephone Encounter (Signed)
 Procedure:EGD Procedure date: 06/17/24 Procedure location: [facility] Arrival Time: EGD Spoke with the patient Y/N: Spoke with pt she states that she has a sinus infections. She said if she doesn't have to be placed on a antibiotic she will be there. She will update us  by thursday Any prep concerns? n  Has the patient obtained the prep from the pharmacy ? n Do you have a care partner and transportation: y Any additional concerns? n

## 2024-06-13 ENCOUNTER — Telehealth: Payer: Self-pay | Admitting: Gastroenterology

## 2024-06-13 DIAGNOSIS — E559 Vitamin D deficiency, unspecified: Secondary | ICD-10-CM | POA: Diagnosis not present

## 2024-06-13 DIAGNOSIS — J329 Chronic sinusitis, unspecified: Secondary | ICD-10-CM | POA: Diagnosis not present

## 2024-06-13 DIAGNOSIS — D6182 Myelophthisis: Secondary | ICD-10-CM | POA: Diagnosis not present

## 2024-06-13 DIAGNOSIS — E1169 Type 2 diabetes mellitus with other specified complication: Secondary | ICD-10-CM | POA: Diagnosis not present

## 2024-06-13 DIAGNOSIS — Z6841 Body Mass Index (BMI) 40.0 and over, adult: Secondary | ICD-10-CM | POA: Diagnosis not present

## 2024-06-13 DIAGNOSIS — E669 Obesity, unspecified: Secondary | ICD-10-CM | POA: Diagnosis not present

## 2024-06-13 DIAGNOSIS — J40 Bronchitis, not specified as acute or chronic: Secondary | ICD-10-CM | POA: Diagnosis not present

## 2024-06-13 NOTE — Telephone Encounter (Signed)
 Reschedule case with patient for next available 08/01/24 at 11:15 am at [facility] with Dr. Charlanne. No changes in medications. Updated instructions have been sent to pt in [place], she had no further questions.

## 2024-06-13 NOTE — Telephone Encounter (Signed)
 Patient called and stated that she is needing to reschedule for hospital procedure do to her having a sinus infection. Please advise.

## 2024-06-17 ENCOUNTER — Ambulatory Visit (HOSPITAL_COMMUNITY): Admission: RE | Admit: 2024-06-17 | Source: Home / Self Care | Admitting: Gastroenterology

## 2024-06-17 SURGERY — EGD (ESOPHAGOGASTRODUODENOSCOPY)
Anesthesia: Monitor Anesthesia Care

## 2024-06-19 DIAGNOSIS — G4733 Obstructive sleep apnea (adult) (pediatric): Secondary | ICD-10-CM | POA: Diagnosis not present

## 2024-06-20 DIAGNOSIS — G4733 Obstructive sleep apnea (adult) (pediatric): Secondary | ICD-10-CM | POA: Diagnosis not present

## 2024-06-27 DIAGNOSIS — E1165 Type 2 diabetes mellitus with hyperglycemia: Secondary | ICD-10-CM | POA: Diagnosis not present

## 2024-06-27 DIAGNOSIS — E785 Hyperlipidemia, unspecified: Secondary | ICD-10-CM | POA: Diagnosis not present

## 2024-06-27 DIAGNOSIS — G720 Drug-induced myopathy: Secondary | ICD-10-CM | POA: Diagnosis not present

## 2024-06-27 DIAGNOSIS — I10 Essential (primary) hypertension: Secondary | ICD-10-CM | POA: Diagnosis not present

## 2024-06-27 DIAGNOSIS — E1169 Type 2 diabetes mellitus with other specified complication: Secondary | ICD-10-CM | POA: Diagnosis not present

## 2024-06-28 ENCOUNTER — Other Ambulatory Visit (HOSPITAL_COMMUNITY): Payer: Self-pay

## 2024-06-28 ENCOUNTER — Other Ambulatory Visit: Payer: Self-pay

## 2024-06-28 MED ORDER — DAPAGLIFLOZIN PROPANEDIOL 5 MG PO TABS
5.0000 mg | ORAL_TABLET | Freq: Every day | ORAL | 3 refills | Status: AC
  Filled 2024-06-28: qty 30, 30d supply, fill #0

## 2024-07-08 DIAGNOSIS — G4733 Obstructive sleep apnea (adult) (pediatric): Secondary | ICD-10-CM | POA: Diagnosis not present

## 2024-07-20 DIAGNOSIS — G4733 Obstructive sleep apnea (adult) (pediatric): Secondary | ICD-10-CM | POA: Diagnosis not present

## 2024-07-24 ENCOUNTER — Telehealth: Payer: Self-pay | Admitting: Gastroenterology

## 2024-07-24 NOTE — Telephone Encounter (Addendum)
 Procedure:Endoscopy/Colonoscopy Procedure date: 08/01/24 Procedure location: [facility] Arrival Time: 9:30 am Spoke with the patient Y/N:   Yes, spoke with patient and went over prep instructions for 11:00 procedure time  Any prep concerns? No  Has the patient obtained the prep from the pharmacy ? Yes Do you have a care partner and transportation: Yes Any additional concerns? No

## 2024-08-01 ENCOUNTER — Other Ambulatory Visit: Payer: Self-pay

## 2024-08-01 ENCOUNTER — Ambulatory Visit (HOSPITAL_COMMUNITY)
Admission: RE | Admit: 2024-08-01 | Discharge: 2024-08-01 | Disposition: A | Discharge status: Home / Self Care | Attending: Gastroenterology | Admitting: Gastroenterology

## 2024-08-01 ENCOUNTER — Encounter (HOSPITAL_COMMUNITY)
Admission: RE | Disposition: A | Discharge status: Home / Self Care | Payer: Self-pay | Source: Home / Self Care | Attending: Gastroenterology

## 2024-08-01 ENCOUNTER — Ambulatory Visit (HOSPITAL_COMMUNITY): Admitting: Anesthesiology

## 2024-08-01 DIAGNOSIS — K2970 Gastritis, unspecified, without bleeding: Secondary | ICD-10-CM | POA: Diagnosis not present

## 2024-08-01 DIAGNOSIS — R195 Other fecal abnormalities: Secondary | ICD-10-CM

## 2024-08-01 DIAGNOSIS — Z7985 Long-term (current) use of injectable non-insulin antidiabetic drugs: Secondary | ICD-10-CM | POA: Insufficient documentation

## 2024-08-01 DIAGNOSIS — R1013 Epigastric pain: Secondary | ICD-10-CM | POA: Diagnosis not present

## 2024-08-01 DIAGNOSIS — Z981 Arthrodesis status: Secondary | ICD-10-CM | POA: Insufficient documentation

## 2024-08-01 DIAGNOSIS — I10 Essential (primary) hypertension: Secondary | ICD-10-CM | POA: Insufficient documentation

## 2024-08-01 DIAGNOSIS — E785 Hyperlipidemia, unspecified: Secondary | ICD-10-CM | POA: Diagnosis not present

## 2024-08-01 DIAGNOSIS — E6689 Other obesity not elsewhere classified: Secondary | ICD-10-CM | POA: Insufficient documentation

## 2024-08-01 DIAGNOSIS — I4719 Other supraventricular tachycardia: Secondary | ICD-10-CM | POA: Insufficient documentation

## 2024-08-01 DIAGNOSIS — Z794 Long term (current) use of insulin: Secondary | ICD-10-CM | POA: Diagnosis not present

## 2024-08-01 DIAGNOSIS — K635 Polyp of colon: Secondary | ICD-10-CM | POA: Diagnosis not present

## 2024-08-01 DIAGNOSIS — Z6841 Body Mass Index (BMI) 40.0 and over, adult: Secondary | ICD-10-CM | POA: Insufficient documentation

## 2024-08-01 DIAGNOSIS — E119 Type 2 diabetes mellitus without complications: Secondary | ICD-10-CM | POA: Insufficient documentation

## 2024-08-01 DIAGNOSIS — K580 Irritable bowel syndrome with diarrhea: Secondary | ICD-10-CM | POA: Diagnosis not present

## 2024-08-01 DIAGNOSIS — Z9049 Acquired absence of other specified parts of digestive tract: Secondary | ICD-10-CM | POA: Insufficient documentation

## 2024-08-01 DIAGNOSIS — K3189 Other diseases of stomach and duodenum: Secondary | ICD-10-CM | POA: Diagnosis not present

## 2024-08-01 DIAGNOSIS — Z79899 Other long term (current) drug therapy: Secondary | ICD-10-CM | POA: Insufficient documentation

## 2024-08-01 DIAGNOSIS — Z7984 Long term (current) use of oral hypoglycemic drugs: Secondary | ICD-10-CM | POA: Diagnosis not present

## 2024-08-01 DIAGNOSIS — K640 First degree hemorrhoids: Secondary | ICD-10-CM | POA: Diagnosis not present

## 2024-08-01 DIAGNOSIS — Z791 Long term (current) use of non-steroidal anti-inflammatories (NSAID): Secondary | ICD-10-CM | POA: Insufficient documentation

## 2024-08-01 DIAGNOSIS — Z800 Family history of malignant neoplasm of digestive organs: Secondary | ICD-10-CM

## 2024-08-01 DIAGNOSIS — R197 Diarrhea, unspecified: Secondary | ICD-10-CM

## 2024-08-01 DIAGNOSIS — K5750 Diverticulosis of both small and large intestine without perforation or abscess without bleeding: Secondary | ICD-10-CM | POA: Diagnosis not present

## 2024-08-01 DIAGNOSIS — K5730 Diverticulosis of large intestine without perforation or abscess without bleeding: Secondary | ICD-10-CM | POA: Diagnosis not present

## 2024-08-01 DIAGNOSIS — K5710 Diverticulosis of small intestine without perforation or abscess without bleeding: Secondary | ICD-10-CM

## 2024-08-01 HISTORY — PX: ESOPHAGOGASTRODUODENOSCOPY: SHX5428

## 2024-08-01 HISTORY — PX: COLONOSCOPY: SHX5424

## 2024-08-01 SURGERY — EGD (ESOPHAGOGASTRODUODENOSCOPY)
Anesthesia: Monitor Anesthesia Care

## 2024-08-01 MED ORDER — LIDOCAINE 2% (20 MG/ML) 5 ML SYRINGE
INTRAMUSCULAR | Status: DC | PRN
  Administered 2024-08-01: 50 mg via INTRAVENOUS

## 2024-08-01 MED ORDER — GLYCOPYRROLATE PF 0.2 MG/ML IJ SOSY
PREFILLED_SYRINGE | INTRAMUSCULAR | Status: DC | PRN
  Administered 2024-08-01: .1 mg via INTRAVENOUS

## 2024-08-01 MED ORDER — SODIUM CHLORIDE 0.9 % IV SOLN: INTRAVENOUS | Status: DC

## 2024-08-01 MED ORDER — PROPOFOL 10 MG/ML IV BOLUS
INTRAVENOUS | Status: DC | PRN
  Administered 2024-08-01: 40 mg via INTRAVENOUS
  Administered 2024-08-01: 130 ug/kg/min via INTRAVENOUS

## 2024-08-01 NOTE — Op Note (Signed)
 [facility] Patient Name: Alexis Choi Procedure Date : 08/01/2024 MRN: [id number] Attending MD: Lynnie Bring , MD, [id number] Date of Birth: [birth year] CSN: [id number] Age: 68 Admit Type: Ambulatory Procedure:                Upper GI endoscopy Indications:              Epigastric abdominal pain. Previous cholecystectomy. Providers:                Lynnie Bring, MD, Ozell Pouch, [place], Technician Referring MD:              Medicines:                Monitored Anesthesia Care Complications:            No immediate complications. Estimated Blood Loss:     Estimated blood loss: none. Procedure:                Pre-Anesthesia Assessment:                           - Prior to the procedure, a History and Physical                            was performed, and patient medications and                            allergies were reviewed. The patient's tolerance of                            previous anesthesia was also reviewed. The risks                            and benefits of the procedure and the sedation                            options and risks were discussed with the patient.                            All questions were answered, and informed consent                            was obtained. Prior Anticoagulants: The patient has                            taken no anticoagulant or antiplatelet agents. ASA                            Grade Assessment: III - A patient with severe                            systemic disease. After reviewing the risks and  benefits, the patient was deemed in satisfactory                            condition to undergo the procedure.                           After obtaining informed consent, the endoscope was                            passed under direct vision. Throughout the                            procedure, the patient's blood pressure, pulse, and                             oxygen saturations were monitored continuously. The                            [id number]-[id number] ([id number]) Olympus endoscope was introduced                            through the mouth, and advanced to the second part                            of duodenum. The upper GI endoscopy was                            accomplished without difficulty. The patient                            tolerated the procedure well. Scope In: Scope Out: Findings:      The examined esophagus was normal.      The Z-line was regular and was found 35 cm from the incisors.      Localized minimal inflammation characterized by congestion (edema) and       erythema was found in the gastric antrum. Biopsies were taken with a       cold forceps for histology.      A 15 mm non-bleeding diverticulum was found in the second portion of the       duodenum. The duodenum otherwise was normal. Biopsies were taken with a       cold forceps for histology to rule out celiac. Impression:               - Minimal gastritis.                           - Non-bleeding duodenal diverticulum (incidental                            finding) Recommendation:           - Patient has a contact number available for                            emergencies. The signs and symptoms of potential  delayed complications were discussed with the                            patient. Return to normal activities tomorrow.                            Written discharge instructions were provided to the                            patient.                           - Resume previous diet.                           - Continue present medications.                           - Await pathology results.                           - The findings and recommendations were discussed                            with the patient's family. Procedure Code(s):        --- Professional ---                           [phone], Esophagogastroduodenoscopy,  flexible,                            transoral; with biopsy, single or multiple Diagnosis Code(s):        --- Professional ---                           K29.70, Gastritis, unspecified, without bleeding                           R10.13, Epigastric pain                           K57.10, Diverticulosis of small intestine without                            perforation or abscess without bleeding CPT copyright 2022 American Medical Association. All rights reserved. The codes documented in this report are preliminary and upon coder review may  be revised to meet current compliance requirements. Lynnie Bring, MD 08/01/2024 11:21:00 AM This report has been signed electronically. Number of Addenda: 0

## 2024-08-01 NOTE — H&P (Signed)
 Chief Complaint: FU   Referring Provider:  Clemmie Nest, MD        ASSESSMENT AND PLAN;    #1. Epi pain- Nl CBC, CMP, lipase. S/P chole in past   #2. IBS-D with element of postcholecystectomy diarrhea. Also exacerbated by meds   #3. H+ stools. Neg colon 10/2019. Nl CBC    #4. FH CRC mom at age 68. Neg colon 2020     Plan: -Increase Protonix  40mg  po BID #60, 11RF -Continue Bentyl  10 mg po BID  -Cholestyamine 4g po every day #30, 6RF -Stop hyoscamine. -EGD/Colon at [facility] (BMI>50)- July 2025, off Mounjaro  x 1 week. -Blood test results from [facility] -If still with problems and above WU is neg, would proceed with CT scan abdo/pelvis.         I discussed EGD/Colonoscopy- the indications, risks, alternatives and potential complications including, but not limited to bleeding, infection, reaction to meds, damage to internal organs, cardiac and/or pulmonary problems, and perforation requiring surgery. The possibility that significant findings could be missed was explained. All ? were answered. Pt consents to proceed.   HPI:     Alexis Choi is a 68 y.o. female  [employer] fundraiser (retd nursing professor) With multiple medical problems as below including IDDM, obesity (BMI>50), s/p cholecystectomy   Discussed the use of AI scribe software for clinical note transcription with the patient, who gave verbal consent to proceed.   History of Present Illness Alexis Choi is a 68 year old female who presents with diarrhea and abdominal pain, possibly related to Mounjaro  use.   She experiences diarrhea and abdominal pain, which she attributes to her use of Mounjaro . The diarrhea occurs at least every other day, often with episodes of incontinence, typically beginning three to four days after taking Mounjaro , preceded by initial constipation. She also experiences nausea and bloating. No foul-smelling diarrhea is present, and it no longer disrupts her sleep.   She has a history  of gallbladder removal, which has led to episodes of bowel dumping. In the past, she was prescribed cholestyramine  powder to manage these symptoms. She mentions taking antibiotics before dental procedures and having been sick in January.   She has concerns about potential blood loss, although her hemoglobin is currently normal. She has not taken aspirin recently and is unsure about her current hemoglobin levels due to issues accessing her lab results.   She has been on Mounjaro  for about a year without experiencing weight loss, and her weight has remained stable. She mentions stress related to her husband's recent diagnosis of early signs of Alzheimer's disease, which has been challenging for her.   Her current medications include Bentyl , taken twice a day for stomach pain, and Protonix , which she plans to increase to twice a day due to stomach irritation from Celebrex . She also takes an antihistamine at night. She previously used hyoscyamine  but stopped due to insurance issues and lack of efficacy.         From previous notes: With H+ stools, Nl CBC with Hb 12.4.  Iron Studies have improved.  Had several colonoscopies in the past by Dr. Towana d/t FH CRC. Last colon 2020- neg except for sigmoid diverticulosis.  Good prep. Rpt 5 yrs per Dr. Towana.   Longstanding IBS-D x yrs. Bms 2-3/day (at baseline). No Noct symptoms.  Cholecystectomy did make diarrhea more prominent.  No melena or hematochezia.  She has been off antibiotics since December 2022 (after lumbar fusion).  Negative stool  studies in the past.  Much better with combination of Bentyl /hyoscyamine . Note that she has to take magnesium .      Wt Readings from Last 3 Encounters:  03/04/24 273 lb 9.6 oz (124.1 kg)  08/28/23 273 lb 12.8 oz (124.2 kg)  05/31/23 269 lb (122 kg)          Past GI procedures: Colonoscopy by Dr. Towana 10/2019-good prep, moderate sigmoid diverticulosis.  Otherwise normal.  Repeat in 5 years due to family  history.   EGD over 15 years ago by Dr. Towana which was negative.   SH- husband with early onset dementia       Past Medical History:  Diagnosis Date   Chronic back pain     Diabetes mellitus     Dysrhythmia      pat   Edema      lower extremity   Encounter for long-term (current) use of other medications     Family history of adverse reaction to anesthesia      mother slow to wake   Fluid retention     GERD (gastroesophageal reflux disease)     Hyperlipidemia      no meds   Hypertension     IBS (irritable bowel syndrome)     It band syndrome, left     Osteoarthritis of knee     Pneumonia      several   PONV (postoperative nausea and vomiting)      occ   Unspecified vitamin D  deficiency                 Past Surgical History:  Procedure Laterality Date   CHOLECYSTECTOMY N/A 06/30/2016    Procedure: LAPAROSCOPIC CHOLECYSTECTOMY;  Surgeon: Herlene Righter Kinsinger, MD;  Location: [facility];  Service: General;  Laterality: N/A;   COLONOSCOPY   10/2019    Dr Towana Divertoculosis of the sigmoid colon Otherwise normal colonoscopy to cecum.   KNEE ARTHROSCOPY Left 2006   knee replacment Right     right knee arthroscopy       TONSILLECTOMY   1963   TOTAL KNEE ARTHROPLASTY Left 06/03/2013    Procedure: LEFT TOTAL KNEE ARTHROPLASTY;  Surgeon: Marcey Raman, MD;  Location: [facility] OR;  Service: Orthopedics;  Laterality: Left;   TUBAL LIGATION   1990   UPPER GI ENDOSCOPY   10/26/2011               Family History  Problem Relation Age of Onset   Diabetes Mother     Cancer Mother     Hypertension Mother     Hypercholesterolemia Mother     Heart disease Mother     Heart disease Father     Diabetes Father     Hypercholesterolemia Father     Kidney disease Father     Hypertension Father            Social History  Social History         Tobacco Use   Smoking status: Never      Passive exposure: Never   Smokeless tobacco: Never  Vaping Use   Vaping status: Never Used   Substance Use Topics   Alcohol  use: No   Drug use: No              Current Outpatient Medications  Medication Sig Dispense Refill   acetaminophen  (TYLENOL ) 650 MG CR tablet Take 1,300 mg by mouth in the morning and at bedtime.  albuterol (VENTOLIN HFA) 108 (90 Base) MCG/ACT inhaler SMARTSIG:2 Puff(s) Via Inhaler       b complex vitamins capsule Take 1 capsule by mouth daily.       celecoxib  (CELEBREX ) 200 MG capsule Take 200 mg by mouth 2 (two) times daily.        dapagliflozin  propanediol (FARXIGA ) 5 MG TABS tablet Take 1 tablet (5 mg total) by mouth daily. (Patient taking differently: Take 2.5 mg by mouth daily.) 30 tablet 3   dicyclomine  (BENTYL ) 10 MG capsule TAKE 1 CAPSULE (10 MG TOTAL) BY MOUTH 3 (THREE) TIMES DAILY BEFORE MEALS. (Patient taking differently: Take 10 mg by mouth 2 (two) times daily.) 270 capsule 1   fluconazole (DIFLUCAN) 150 MG tablet Take by mouth. As needed       gabapentin  (NEURONTIN ) 300 MG capsule Take 300 mg by mouth 3 (three) times daily.       Insulin  Disposable Pump (OMNIPOD 5 PACK) MISC         Insulin  Disposable Pump (OMNIPOD DASH 5 PACK PODS) MISC USE 1 POD EVERY 2 DAYS       insulin  glargine (LANTUS ) 100 UNIT/ML injection Inject 40 Units into the skin daily.       Insulin  Pen Needle (PEN NEEDLES 31GX5/16) 31G X 8 MM MISC by Does not apply route.       levocetirizine (XYZAL) 5 MG tablet Take 5 mg by mouth every evening.       Magnesium  Gluconate 250 MG TABS Take 250 mg by mouth at bedtime as needed.       Multiple Vitamin (MULTIVITAMIN) capsule Take 1 capsule by mouth daily.       NOVOLOG  100 UNIT/ML injection Inject into the skin.       nystatin cream (MYCOSTATIN) Apply 1 application topically See admin instructions. Apply to perineal area once a day       ONETOUCH ULTRA test strip 1 STRIP UP TO 6 TIMES DAILY       pantoprazole  (PROTONIX ) 40 MG tablet TAKE 1 TABLET BY MOUTH EVERY DAY 90 tablet 1   sodium chloride  (MURO 128) 5 % ophthalmic  ointment Place 1 application into both eyes at bedtime.       telmisartan -hydrochlorothiazide  (MICARDIS  HCT) 80-12.5 MG tablet Take 1 tablet by mouth daily.       tirzepatide  (MOUNJARO ) 15 MG/0.5ML Pen Inject 15 mg into the skin every 7 (seven) days. 2 mL 3   tirzepatide  (MOUNJARO ) 15 MG/0.5ML Pen Inject 15 mg into the skin every 7 (seven) days. 2 mL 5   triamcinolone cream (KENALOG) 0.1 % Apply topically 2 (two) times daily as needed.       verapamil  (CALAN ) 40 MG tablet Take 1 tablet (40 mg total) by mouth 2 (two) times daily. 180 tablet 1   hyoscyamine  (LEVSIN  SL) 0.125 MG SL tablet PLACE 1 TABLET (0.125 MG TOTAL) UNDER THE TONGUE EVERY 8 (EIGHT) HOURS AS NEEDED FOR CRAMPING. (Patient not taking: Reported on 03/04/2024) 270 tablet 2      No current facility-administered medications for this visit.        Allergies       Allergies  Allergen Reactions   Latex Itching, Rash and Other (See Comments)      NO Band-Aids!!   Egg-Derived Products Nausea And Vomiting   Januvia [Sitagliptin] Palpitations and Rash   Metformin And Related Itching, Palpitations and Rash        Review of Systems:  neg  Physical Exam:     BP 128/70   Pulse 98   Ht 5' (1.524 m)   Wt 273 lb 9.6 oz (124.1 kg)   LMP  (LMP Unknown)   SpO2 96%   BMI 53.43 kg/m     Wt Readings from Last 3 Encounters:  03/04/24 273 lb 9.6 oz (124.1 kg)  08/28/23 273 lb 12.8 oz (124.2 kg)  05/31/23 269 lb (122 kg)    Constitutional:  Well-developed, in no acute distress. Psychiatric: Normal mood and affect. Behavior is normal. HEENT: Pupils normal.  Conjunctivae are normal. No scleral icterus.  Cardiovascular: Normal rate, regular rhythm. No edema Pulmonary/chest: Effort normal and breath sounds normal. No wheezing, rales or rhonchi. Abdominal: Soft, nondistended.  Mild epi tenderness. Bowel sounds active throughout. There are no masses palpable. No hepatomegaly. Rectal: Deferred Neurological: Alert and oriented to  person place and time. Skin: Skin is warm and dry. No rashes noted.   Data Reviewed: I have personally reviewed following labs and imaging studies From 04/25/2022: -Normal CBC with hemoglobin 12.4, MCV 84, platelets 282, hemoglobin A1c 7.7 -Nl B12, TSH, CMP including LFTs with AST 21, ALT 20 -Iron studies were normal except for saturation of 14%       Latest Ref Rng & Units 09/29/2020    3:00 PM 07/28/2020    1:56 PM 05/05/2020    9:56 PM  CBC  WBC 3.8 - 10.8 Thousand/uL 12.1  12.9  16.0   Hemoglobin 11.7 - 15.5 g/dL 87.4  88.7  9.0   Hematocrit 35.0 - 45.0 % 36.5  34.6  29.7   Platelets 140 - 400 Thousand/uL 274  293  491         Anselm Bring, MD [facility] [phone]

## 2024-08-01 NOTE — Op Note (Addendum)
 [facility] Patient Name: Alexis Choi Procedure Date : 08/01/2024 MRN: [id number] Attending MD: Lynnie Bring , MD, [id number] Date of Birth: October 22, 1956 CSN: [id number] Age: 68 Admit Type: Ambulatory Procedure:                Colonoscopy Indications:              FH CRC (mom at age 82), H+stools/clinically                            significant diarrhea of unexplained origin Providers:                Lynnie Bring, MD, Ozell Pouch, [place]y [place], Technician Referring MD:              Medicines:                Monitored Anesthesia Care Complications:            No immediate complications. Estimated Blood Loss:     Estimated blood loss: none. Procedure:                Pre-Anesthesia Assessment:                           - Prior to the procedure, a History and Physical                            was performed, and patient medications and                            allergies were reviewed. The patient's tolerance of                            previous anesthesia was also reviewed. The risks                            and benefits of the procedure and the sedation                            options and risks were discussed with the patient.                            All questions were answered, and informed consent                            was obtained. Prior Anticoagulants: The patient has                            taken no anticoagulant or antiplatelet agents. ASA                            Grade Assessment: III - A patient with severe  systemic disease. After reviewing the risks and                            benefits, the patient was deemed in satisfactory                            condition to undergo the procedure.                           After obtaining informed consent, the colonoscope                            was passed under direct vision. Throughout the                            procedure,  the patient's blood pressure, pulse, and                            oxygen saturations were monitored continuously. The                            CF-HQ190L ([id number]) Olympus colonoscopy was                            introduced through the anus and advanced to the 4                            cm into the ileum. The colonoscopy was performed                            without difficulty. The patient tolerated the                            procedure well. The quality of the bowel                            preparation was good. The terminal ileum, ileocecal                            valve, appendiceal orifice, and rectum were                            photographed. Scope In: 10:58:08 AM Scope Out: 11:15:36 AM Scope Withdrawal Time: 0 hours 10 minutes 17 seconds  Total Procedure Duration: 0 hours 17 minutes 28 seconds  Findings:      A 6 mm polyp was found in the mid descending colon. The polyp was       sessile. The polyp was removed with a cold snare. Resection and       retrieval were complete.      The colon (entire examined portion) appeared normal. Biopsies for       histology were taken with a cold forceps from the entire colon for       evaluation of microscopic colitis.      Multiple medium-mouthed diverticula were found in the  sigmoid colon and       descending colon.      Non-bleeding internal hemorrhoids were found during retroflexion. The       hemorrhoids were small and Grade I (internal hemorrhoids that do not       prolapse).      The terminal ileum appeared normal.      The exam was otherwise without abnormality on direct and retroflexion       views. Impression:               - One 6 mm polyp in the mid descending colon,                            removed with a cold snare. Resected and retrieved.                           - Moderate left colonic diverticulosis.                           - Non-bleeding internal hemorrhoids.                           - The examined  portion of the ileum was normal.                           - The examination was otherwise normal on direct                            and retroflexion views. Recommendation:           - Patient has a contact number available for                            emergencies. The signs and symptoms of potential                            delayed complications were discussed with the                            patient. Return to normal activities tomorrow.                            Written discharge instructions were provided to the                            patient.                           - Resume previous diet.                           - Continue present medications.                           - Await pathology results.                           - Repeat  colonoscopy for surveillance based on                            pathology results.                           - The findings and recommendations were discussed                            with the patient's family. Procedure Code(s):        --- Professional ---                           [phone], Colonoscopy, flexible; with removal of                            tumor(s), polyp(s), or other lesion(s) by snare                            technique                           45380, 59, Colonoscopy, flexible; with biopsy,                            single or multiple Diagnosis Code(s):        --- Professional ---                           D12.4, Benign neoplasm of descending colon                           K64.0, First degree hemorrhoids                           R19.7, Diarrhea, unspecified                           K57.30, Diverticulosis of large intestine without                            perforation or abscess without bleeding CPT copyright 2022 American Medical Association. All rights reserved. The codes documented in this report are preliminary and upon coder review may  be revised to meet current compliance requirements. Lynnie Bring,  MD 08/01/2024 11:24:51 AM This report has been signed electronically. Number of Addenda: 0

## 2024-08-01 NOTE — Anesthesia Postprocedure Evaluation (Signed)
 Anesthesia Post Note  Patient: Alexis Choi  Procedure(s) Performed: EGD (ESOPHAGOGASTRODUODENOSCOPY) COLONOSCOPY     Patient location during evaluation: PACU Anesthesia Type: MAC Level of consciousness: awake and alert Pain management: pain level controlled Vital Signs Assessment: post-procedure vital signs reviewed and stable Respiratory status: spontaneous breathing, nonlabored ventilation and respiratory function stable Cardiovascular status: blood pressure returned to baseline and stable Postop Assessment: no apparent nausea or vomiting Anesthetic complications: no   No notable events documented.  Last Vitals:  Vitals:   08/01/24 1135 08/01/24 1140  BP:  (!) 137/49  Pulse: 96 97  Resp: (!) 22 16  Temp:    SpO2: 95% 95%    Last Pain:  Vitals:   08/01/24 1140  TempSrc:   PainSc: 0-No pain                 Butler Levander Pinal

## 2024-08-01 NOTE — Transfer of Care (Signed)
 Immediate Anesthesia Transfer of Care Note  Patient: Alexis Choi  Procedure(s) Performed: EGD (ESOPHAGOGASTRODUODENOSCOPY) COLONOSCOPY  Patient Location: PACU  Anesthesia Type:MAC  Level of Consciousness: awake and alert   Airway & Oxygen Therapy: Patient Spontanous Breathing and Patient connected to nasal cannula oxygen  Post-op Assessment: Report given to RN and Post -op Vital signs reviewed and stable  Post vital signs: Reviewed and stable  Last Vitals:  Vitals Value Taken Time  BP 144/47 08/01/24 11:24  Temp    Pulse 100 08/01/24 11:26  Resp 19 08/01/24 11:26  SpO2 99 % 08/01/24 11:26  Vitals shown include unfiled device data.  Last Pain:  Vitals:   08/01/24 1124  TempSrc:   PainSc: Asleep         Complications: No notable events documented.

## 2024-08-01 NOTE — Discharge Instructions (Signed)

## 2024-08-01 NOTE — Anesthesia Preprocedure Evaluation (Signed)
 Anesthesia Evaluation  Patient identified by MRN, date of birth, ID band Patient awake    Reviewed: Allergy & Precautions, H&P , NPO status , Patient's Chart, lab work & pertinent test results  History of Anesthesia Complications (+) PONV and history of anesthetic complications  Airway Mallampati: II  TM Distance: >3 FB Neck ROM: Full    Dental  (+) Dental Advisory Given   Pulmonary neg pulmonary ROS   Pulmonary exam normal breath sounds clear to auscultation       Cardiovascular hypertension, Normal cardiovascular exam+ dysrhythmias  Rhythm:Regular Rate:Normal     Neuro/Psych negative neurological ROS  negative psych ROS   GI/Hepatic negative GI ROS, Neg liver ROS,,,  Endo/Other  diabetes  Class 4 obesity  Renal/GU negative Renal ROS  negative genitourinary   Musculoskeletal  (+) Arthritis ,    Abdominal  (+) + obese  Peds negative pediatric ROS (+)  Hematology negative hematology ROS (+)   Anesthesia Other Findings   Reproductive/Obstetrics negative OB ROS                              Anesthesia Physical Anesthesia Plan  ASA: 3  Anesthesia Plan: MAC   Post-op Pain Management:    Induction: Intravenous  PONV Risk Score and Plan: 3 and Propofol  infusion and Treatment may vary due to age or medical condition  Airway Management Planned: Nasal Cannula  Additional Equipment:   Intra-op Plan:   Post-operative Plan:   Informed Consent: I have reviewed the patients History and Physical, chart, labs and discussed the procedure including the risks, benefits and alternatives for the proposed anesthesia with the patient or authorized representative who has indicated his/her understanding and acceptance.     Dental advisory given  Plan Discussed with: CRNA  Anesthesia Plan Comments:         Anesthesia Quick Evaluation

## 2024-08-02 ENCOUNTER — Encounter (HOSPITAL_COMMUNITY): Payer: Self-pay | Admitting: Gastroenterology

## 2024-08-05 LAB — SURGICAL PATHOLOGY

## 2024-08-11 ENCOUNTER — Ambulatory Visit: Payer: Self-pay | Admitting: Gastroenterology

## 2024-08-14 ENCOUNTER — Other Ambulatory Visit: Payer: Self-pay

## 2024-08-14 ENCOUNTER — Other Ambulatory Visit: Payer: Self-pay | Admitting: Gastroenterology

## 2024-08-15 ENCOUNTER — Other Ambulatory Visit (HOSPITAL_COMMUNITY): Payer: Self-pay

## 2024-08-20 DIAGNOSIS — G4733 Obstructive sleep apnea (adult) (pediatric): Secondary | ICD-10-CM | POA: Diagnosis not present

## 2024-08-21 DIAGNOSIS — E1169 Type 2 diabetes mellitus with other specified complication: Secondary | ICD-10-CM | POA: Diagnosis not present

## 2024-09-10 DIAGNOSIS — E538 Deficiency of other specified B group vitamins: Secondary | ICD-10-CM | POA: Diagnosis not present

## 2024-09-10 DIAGNOSIS — E669 Obesity, unspecified: Secondary | ICD-10-CM | POA: Diagnosis not present

## 2024-09-10 DIAGNOSIS — M79671 Pain in right foot: Secondary | ICD-10-CM | POA: Diagnosis not present

## 2024-09-10 DIAGNOSIS — E559 Vitamin D deficiency, unspecified: Secondary | ICD-10-CM | POA: Diagnosis not present

## 2024-09-10 DIAGNOSIS — R300 Dysuria: Secondary | ICD-10-CM | POA: Diagnosis not present

## 2024-09-10 DIAGNOSIS — E1169 Type 2 diabetes mellitus with other specified complication: Secondary | ICD-10-CM | POA: Diagnosis not present

## 2024-09-10 DIAGNOSIS — M79672 Pain in left foot: Secondary | ICD-10-CM | POA: Diagnosis not present

## 2024-09-10 DIAGNOSIS — Z23 Encounter for immunization: Secondary | ICD-10-CM | POA: Diagnosis not present

## 2024-09-10 DIAGNOSIS — N3001 Acute cystitis with hematuria: Secondary | ICD-10-CM | POA: Diagnosis not present

## 2024-09-16 DIAGNOSIS — M1990 Unspecified osteoarthritis, unspecified site: Secondary | ICD-10-CM | POA: Diagnosis not present

## 2024-09-16 DIAGNOSIS — E11319 Type 2 diabetes mellitus with unspecified diabetic retinopathy without macular edema: Secondary | ICD-10-CM | POA: Diagnosis not present

## 2024-09-16 DIAGNOSIS — E785 Hyperlipidemia, unspecified: Secondary | ICD-10-CM | POA: Diagnosis not present

## 2024-09-16 DIAGNOSIS — I4891 Unspecified atrial fibrillation: Secondary | ICD-10-CM | POA: Diagnosis not present

## 2024-09-16 DIAGNOSIS — Z794 Long term (current) use of insulin: Secondary | ICD-10-CM | POA: Diagnosis not present

## 2024-09-16 DIAGNOSIS — I509 Heart failure, unspecified: Secondary | ICD-10-CM | POA: Diagnosis not present

## 2024-09-16 DIAGNOSIS — E1142 Type 2 diabetes mellitus with diabetic polyneuropathy: Secondary | ICD-10-CM | POA: Diagnosis not present

## 2024-09-16 DIAGNOSIS — I110 Hypertensive heart disease with heart failure: Secondary | ICD-10-CM | POA: Diagnosis not present

## 2024-09-19 ENCOUNTER — Other Ambulatory Visit: Payer: Self-pay | Admitting: Medical Genetics

## 2024-09-19 DIAGNOSIS — Z006 Encounter for examination for normal comparison and control in clinical research program: Secondary | ICD-10-CM

## 2024-09-21 ENCOUNTER — Other Ambulatory Visit: Payer: Self-pay | Admitting: Gastroenterology

## 2024-09-27 ENCOUNTER — Other Ambulatory Visit (HOSPITAL_BASED_OUTPATIENT_CLINIC_OR_DEPARTMENT_OTHER): Payer: Self-pay | Admitting: Family Medicine

## 2024-09-27 DIAGNOSIS — Z1239 Encounter for other screening for malignant neoplasm of breast: Secondary | ICD-10-CM

## 2024-10-02 ENCOUNTER — Ambulatory Visit (HOSPITAL_BASED_OUTPATIENT_CLINIC_OR_DEPARTMENT_OTHER)
Admission: RE | Admit: 2024-10-02 | Discharge: 2024-10-02 | Disposition: A | Discharge status: Home / Self Care | Source: Ambulatory Visit | Attending: Family Medicine | Admitting: Family Medicine

## 2024-10-02 DIAGNOSIS — Z1231 Encounter for screening mammogram for malignant neoplasm of breast: Secondary | ICD-10-CM | POA: Diagnosis not present

## 2024-10-02 DIAGNOSIS — Z1239 Encounter for other screening for malignant neoplasm of breast: Secondary | ICD-10-CM

## 2024-10-03 DIAGNOSIS — M21611 Bunion of right foot: Secondary | ICD-10-CM | POA: Diagnosis not present

## 2024-10-03 DIAGNOSIS — E1142 Type 2 diabetes mellitus with diabetic polyneuropathy: Secondary | ICD-10-CM | POA: Diagnosis not present

## 2024-10-07 ENCOUNTER — Other Ambulatory Visit: Payer: Self-pay | Admitting: Family Medicine

## 2024-10-07 ENCOUNTER — Other Ambulatory Visit (HOSPITAL_BASED_OUTPATIENT_CLINIC_OR_DEPARTMENT_OTHER): Payer: Self-pay

## 2024-10-07 DIAGNOSIS — R928 Other abnormal and inconclusive findings on diagnostic imaging of breast: Secondary | ICD-10-CM

## 2024-10-07 MED ORDER — ONETOUCH DELICA PLUS LANCET30G MISC
Freq: Four times a day (QID) | 1 refills | Status: AC
  Filled 2024-10-07 (×2): qty 400, 90d supply, fill #0

## 2024-10-07 MED ORDER — ONETOUCH VERIO VI STRP
ORAL_STRIP | 1 refills | Status: AC
  Filled 2024-10-07: qty 400, 90d supply, fill #0
  Filled 2024-10-07: qty 360, 90d supply, fill #0

## 2024-10-08 ENCOUNTER — Other Ambulatory Visit (HOSPITAL_BASED_OUTPATIENT_CLINIC_OR_DEPARTMENT_OTHER): Payer: Self-pay

## 2024-10-10 ENCOUNTER — Ambulatory Visit
Admission: RE | Admit: 2024-10-10 | Discharge: 2024-10-10 | Disposition: A | Discharge status: Home / Self Care | Source: Ambulatory Visit | Attending: Family Medicine | Admitting: Family Medicine

## 2024-10-10 ENCOUNTER — Other Ambulatory Visit

## 2024-10-10 DIAGNOSIS — R928 Other abnormal and inconclusive findings on diagnostic imaging of breast: Secondary | ICD-10-CM

## 2024-10-20 DIAGNOSIS — G4733 Obstructive sleep apnea (adult) (pediatric): Secondary | ICD-10-CM | POA: Diagnosis not present

## 2024-12-18 ENCOUNTER — Other Ambulatory Visit: Payer: Self-pay | Admitting: Student

## 2024-12-20 ENCOUNTER — Other Ambulatory Visit: Payer: Self-pay | Admitting: Gastroenterology
